# Patient Record
Sex: Male | Born: 1945 | Race: White | Hispanic: No | Marital: Married | Smoking: Former smoker
Health system: Southern US, Community
[De-identification: ages and names within clinical notes are randomized; demographics above are authoritative.]

## PROBLEM LIST (undated history)

## (undated) DIAGNOSIS — Z8719 Personal history of other diseases of the digestive system: Secondary | ICD-10-CM

## (undated) DIAGNOSIS — K219 Gastro-esophageal reflux disease without esophagitis: Secondary | ICD-10-CM

## (undated) DIAGNOSIS — N200 Calculus of kidney: Secondary | ICD-10-CM

## (undated) DIAGNOSIS — K469 Unspecified abdominal hernia without obstruction or gangrene: Secondary | ICD-10-CM

## (undated) DIAGNOSIS — M1990 Unspecified osteoarthritis, unspecified site: Secondary | ICD-10-CM

## (undated) HISTORY — PX: HERNIA REPAIR: SHX51

---

## 1969-12-27 HISTORY — PX: ORCHIECTOMY: SHX2116

## 2001-01-30 ENCOUNTER — Ambulatory Visit (HOSPITAL_BASED_OUTPATIENT_CLINIC_OR_DEPARTMENT_OTHER): Admission: RE | Admit: 2001-01-30 | Discharge: 2001-01-30 | Payer: Self-pay | Admitting: Orthopedic Surgery

## 2004-05-29 ENCOUNTER — Emergency Department (HOSPITAL_COMMUNITY): Admission: EM | Admit: 2004-05-29 | Discharge: 2004-05-29 | Payer: Self-pay | Admitting: Family Medicine

## 2004-06-04 ENCOUNTER — Emergency Department (HOSPITAL_COMMUNITY): Admission: EM | Admit: 2004-06-04 | Discharge: 2004-06-04 | Payer: Self-pay | Admitting: Family Medicine

## 2004-06-16 ENCOUNTER — Emergency Department (HOSPITAL_COMMUNITY): Admission: EM | Admit: 2004-06-16 | Discharge: 2004-06-16 | Payer: Self-pay | Admitting: Family Medicine

## 2005-04-23 ENCOUNTER — Emergency Department (HOSPITAL_COMMUNITY): Admission: EM | Admit: 2005-04-23 | Discharge: 2005-04-23 | Payer: Self-pay | Admitting: Family Medicine

## 2005-06-08 ENCOUNTER — Emergency Department (HOSPITAL_COMMUNITY): Admission: EM | Admit: 2005-06-08 | Discharge: 2005-06-08 | Payer: Self-pay | Admitting: Family Medicine

## 2005-06-16 ENCOUNTER — Ambulatory Visit (HOSPITAL_COMMUNITY): Admission: RE | Admit: 2005-06-16 | Discharge: 2005-06-16 | Payer: Self-pay | Admitting: Orthopedic Surgery

## 2005-07-14 ENCOUNTER — Ambulatory Visit: Payer: Self-pay | Admitting: Infectious Diseases

## 2006-08-16 ENCOUNTER — Ambulatory Visit (HOSPITAL_BASED_OUTPATIENT_CLINIC_OR_DEPARTMENT_OTHER): Admission: RE | Admit: 2006-08-16 | Discharge: 2006-08-16 | Payer: Self-pay | Admitting: Orthopedic Surgery

## 2006-12-27 DIAGNOSIS — N200 Calculus of kidney: Secondary | ICD-10-CM

## 2006-12-27 HISTORY — PX: KNEE ARTHROSCOPY: SUR90

## 2006-12-27 HISTORY — DX: Calculus of kidney: N20.0

## 2008-10-12 ENCOUNTER — Emergency Department (HOSPITAL_COMMUNITY): Admission: EM | Admit: 2008-10-12 | Discharge: 2008-10-12 | Payer: Self-pay | Admitting: Family Medicine

## 2008-12-27 HISTORY — PX: OTHER SURGICAL HISTORY: SHX169

## 2009-03-10 ENCOUNTER — Emergency Department (HOSPITAL_COMMUNITY): Admission: EM | Admit: 2009-03-10 | Discharge: 2009-03-10 | Payer: Self-pay | Admitting: Emergency Medicine

## 2010-09-08 ENCOUNTER — Emergency Department (HOSPITAL_BASED_OUTPATIENT_CLINIC_OR_DEPARTMENT_OTHER): Admission: EM | Admit: 2010-09-08 | Discharge: 2010-09-08 | Payer: Self-pay | Admitting: Emergency Medicine

## 2010-09-08 ENCOUNTER — Ambulatory Visit: Payer: Self-pay | Admitting: Diagnostic Radiology

## 2011-02-04 ENCOUNTER — Other Ambulatory Visit (HOSPITAL_COMMUNITY): Payer: Self-pay | Admitting: Internal Medicine

## 2011-02-04 ENCOUNTER — Encounter (INDEPENDENT_AMBULATORY_CARE_PROVIDER_SITE_OTHER): Payer: Self-pay | Admitting: *Deleted

## 2011-02-04 DIAGNOSIS — R140 Abdominal distension (gaseous): Secondary | ICD-10-CM

## 2011-02-08 ENCOUNTER — Ambulatory Visit (HOSPITAL_COMMUNITY)
Admission: RE | Admit: 2011-02-08 | Discharge: 2011-02-08 | Disposition: A | Discharge status: Home / Self Care | Payer: 59 | Source: Ambulatory Visit | Attending: Internal Medicine | Admitting: Internal Medicine

## 2011-02-08 ENCOUNTER — Encounter (INDEPENDENT_AMBULATORY_CARE_PROVIDER_SITE_OTHER): Payer: Self-pay | Admitting: *Deleted

## 2011-02-08 DIAGNOSIS — R142 Eructation: Secondary | ICD-10-CM | POA: Insufficient documentation

## 2011-02-08 DIAGNOSIS — R140 Abdominal distension (gaseous): Secondary | ICD-10-CM

## 2011-02-08 DIAGNOSIS — K7689 Other specified diseases of liver: Secondary | ICD-10-CM | POA: Insufficient documentation

## 2011-02-08 DIAGNOSIS — R141 Gas pain: Secondary | ICD-10-CM | POA: Insufficient documentation

## 2011-02-11 NOTE — Letter (Signed)
Summary: New Patient letter  [facility]  [address]   [place], [place] [address]   Phone: [phone]  Fax: [phone]       02/04/2011 MRN: [id number]  Eddie Walker [address] [place], [place]  [address]  Dear Eddie Walker,  Welcome to the Gastroenterology Division at [facility].    You are scheduled to see Dr.  Christella Hartigan on 02/26/2011 at 10:00 on the 3rd floor at [facility], [address]  We ask that you try to arrive at our office 15 minutes prior to your appointment time to allow for check-in.  We would like you to complete the enclosed self-administered evaluation form prior to your visit and bring it with you on the day of your appointment.  We will review it with you.  Also, please bring a complete list of all your medications or, if you prefer, bring the medication bottles and we will list them.  Please bring your insurance card so that we may make a copy of it.  If your insurance requires a referral to see a specialist, please bring your referral form from your primary care physician.  Co-payments are due at the time of your visit and may be paid by cash, check or credit card.     Your office visit will consist of a consult with your physician (includes a physical exam), any laboratory testing he/she may order, scheduling of any necessary diagnostic testing (e.g. x-ray, ultrasound, CT-scan), and scheduling of a procedure (e.g. Endoscopy, Colonoscopy) if required.  Please allow enough time on your schedule to allow for any/all of these possibilities.    If you cannot keep your appointment, please call [phone] to cancel or reschedule prior to your appointment date.  This allows [place] the opportunity to schedule an appointment for another patient in need of care.  If you do not cancel or reschedule by 5 p.m. the business day prior to your appointment date, you will be charged a $50.00 late cancellation/no-show fee.    Thank you for  choosing [facility] for your medical needs.  We appreciate the opportunity to care for you.  Please visit [place] at our website  to learn more about our practice.                     Sincerely,                                                             The Gastroenterology Division

## 2011-03-08 ENCOUNTER — Encounter: Payer: Self-pay | Admitting: Gastroenterology

## 2011-03-08 ENCOUNTER — Ambulatory Visit (INDEPENDENT_AMBULATORY_CARE_PROVIDER_SITE_OTHER): Payer: Commercial Managed Care - PPO | Admitting: Gastroenterology

## 2011-03-08 ENCOUNTER — Encounter (INDEPENDENT_AMBULATORY_CARE_PROVIDER_SITE_OTHER): Payer: Self-pay | Admitting: *Deleted

## 2011-03-08 DIAGNOSIS — R12 Heartburn: Secondary | ICD-10-CM

## 2011-03-08 DIAGNOSIS — Z1211 Encounter for screening for malignant neoplasm of colon: Secondary | ICD-10-CM

## 2011-03-16 NOTE — Letter (Signed)
Summary: [facility] Instructions  [facility]  [address] [place], [place] [address]   Phone: [phone]  Fax: [phone]       EDIBERTO Walker    20-May-1946    MRN: [id number]        Procedure Day /Date:03/29/11 MON     Arrival Time:230 pm     Procedure Time:330 pm     Location of Procedure:                    X   Endoscopy Center (4th Floor)                        PREPARATION FOR COLONOSCOPY WITH MOVIPREP   Starting 5 days prior to your procedure 03/24/11  do not eat nuts, seeds, popcorn, corn, beans, peas,  salads, or any raw vegetables.  Do not take any fiber supplements (e.g. Metamucil, Citrucel, and Benefiber).  THE DAY BEFORE YOUR PROCEDURE         DATE: 03/28/11  DAY:SUN  1.  Drink clear liquids the entire day-NO SOLID FOOD  2.  Do not drink anything colored red or purple.  Avoid juices with pulp.  No orange juice.  3.  Drink at least 64 oz. (8 glasses) of fluid/clear liquids during the day to prevent dehydration and help the prep work efficiently.  CLEAR LIQUIDS INCLUDE: Water Jello Ice Popsicles Tea (sugar ok, no milk/cream) Powdered fruit flavored drinks Coffee (sugar ok, no milk/cream) Gatorade Juice: apple, white grape, white cranberry  Lemonade Clear bullion, consomm, broth Carbonated beverages (any kind) Strained chicken noodle soup Hard Candy                             4.  In the morning, mix first dose of MoviPrep solution:    Empty 1 Pouch A and 1 Pouch B into the disposable container    Add lukewarm drinking water to the top line of the container. Mix to dissolve    Refrigerate (mixed solution should be used within 24 hrs)  5.  Begin drinking the prep at 5:00 p.m. The MoviPrep container is divided by 4 marks.   Every 15 minutes drink the solution down to the next mark (approximately 8 oz) until the full liter is complete.   6.  Follow completed prep with 16 oz of clear liquid of your choice (Nothing red or  purple).  Continue to drink clear liquids until bedtime.  7.  Before going to bed, mix second dose of MoviPrep solution:    Empty 1 Pouch A and 1 Pouch B into the disposable container    Add lukewarm drinking water to the top line of the container. Mix to dissolve    Refrigerate  THE DAY OF YOUR PROCEDURE      DATE: 03/29/11 DAY: MON  Beginning at 1030 a.m. (5 hours before procedure):         1. Every 15 minutes, drink the solution down to the next mark (approx 8 oz) until the full liter is complete.  2. Follow completed prep with 16 oz. of clear liquid of your choice.    3. You may drink clear liquids until 130 pm (2 HOURS BEFORE PROCEDURE).   MEDICATION INSTRUCTIONS  Unless otherwise instructed, you should take regular prescription medications with a small sip of water   as early as possible the morning of your procedure.  OTHER INSTRUCTIONS  You will need a responsible adult at least 65 years of age to accompany you and drive you home.   This person must remain in the waiting room during your procedure.  Wear loose fitting clothing that is easily removed.  Leave jewelry and other valuables at home.  However, you may wish to bring a book to read or  an iPod/MP3 player to listen to music as you wait for your procedure to start.  Remove all body piercing jewelry and leave at home.  Total time from sign-in until discharge is approximately 2-3 hours.  You should go home directly after your procedure and rest.  You can resume normal activities the  day after your procedure.  The day of your procedure you should not:   Drive   Make legal decisions   Operate machinery   Drink alcohol   Return to work  You will receive specific instructions about eating, activities and medications before you leave.    The above instructions have been reviewed and explained to me by   _______________________    I fully understand and can verbalize these  instructions _____________________________ Date _________

## 2011-03-16 NOTE — Assessment & Plan Note (Signed)
History of Present Illness Visit Type: Initial Consult Primary GI MD: Rob Bunting MD Primary Provider: Creola Corn, MD Requesting Provider: Creola Corn, MD Chief Complaint: Bloating History of Present Illness:        very pleasant 65 year old man who quit smoking 1.5 years ago.  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  Avoids spicey foods.  Takes several rolaids at night.  Sometimes awakens with pryosis, takes baking soda a night.  He tried prilosec course 14 days.  He felt very well on it.  Felt MUCH better.  Stopped a couple days ago and symptoms begining to return.  Was put on abx for H.pylori serology, done 1-2 weeks ago.  he's never had a colonoscopy, he has no lower GI symptoms.  Overall weight up since he stopped smoking.  Working out recently.   He had an abdominal ultrasound last month and it was normal. CBC and complete metabolic profile were also normal           Current Medications (verified): 1)  Glucosamine-Chondroitin-Msm 500-400-167 Mg Tabs (Glucosamine-Chondroitin-Msm) .Marland Kitchen.. 1 By Mouth Once Daily 2)  Daily Multi  Tabs (Multiple Vitamins-Minerals) .Marland Kitchen.. 1 By Mouth Once Daily 3)  Aspir-Low 81 Mg Tbec (Aspirin) .Marland Kitchen.. 1 By Mouth Once Daily 4)  Avodart 0.5 Mg Caps (Dutasteride) .Marland Kitchen.. 1 By Mouth Q Am  Allergies (verified): 1)  ! Novocain 2)  ! * Peanuts  Past History:  Past Medical History: Arthritis Kidney Stones Benign prostatic hypertrophy  H. pylori positive by blood tests, 2012 treated with antibiotics  Past Surgical History: Knee Arthroscopy 2004 Hernia Surgery R testical removal 1970      Family History:  no colon cancer  Social History:  he is married, he has 3 children, he is a retired Pharmacologist , he does not drink alcohol very often, he does not smoke cigarettes.  Review of Systems       Pertinent positive and negative review of systems were noted in the above HPI and GI specific review of systems.  All other review of  systems was otherwise negative.   Vital Signs:  Patient profile:   65 year old male Height:      73 inches Weight:      204.13 pounds BMI:     27.03 Pulse rate:   68 / minute Pulse rhythm:   regular BP sitting:   116 / 80  (left arm) Cuff size:   regular  Vitals Entered By: June McMurray CMA Duncan Dull) (March 08, 2011 9:48 AM)  Physical Exam  Additional Exam:  Constitutional: generally well appearing Psychiatric: alert and oriented times 3 Eyes: extraocular movements intact Mouth: oropharynx moist, no lesions Neck: supple, no lymphadenopathy Cardiovascular: heart regular rate and rythm Lungs: CTA bilaterally Abdomen: soft, non-tender, non-distended, no obvious ascites, no peritoneal signs, normal bowel sounds Extremities: no lower extremity edema bilaterally Skin: no lesions on visible extremities    Impression & Recommendations:  Problem # 1:  GERD  he has pretty classic GERD symptoms that are well controlled on once daily and he asked that medicine. we will proceed with EGD at his soonest convenience to screen for Barrett's complication, peptic ulcer disease. He did have H. pylori he serologies come back positive one month ago. If he has residual gastritis I will biopsy his stomach to see he has continued infection.  Problem # 2:   routine risk for colon cancer  colonoscopy at the same time as his upper endoscopy.  Patient Instructions:  1)  You will be scheduled to have a colonoscopy. 2)  You will be scheduled to have an upper endoscopy. 3)  GERD handout given. 4)  Restart omeprazole (OTC antiacid medicine) once daily (best taken 20-30 mimutes before a meal. 5)  A copy of this information will be sent to Dr. Timothy Lasso.  6)  The medication list was reviewed and reconciled.  All changed / newly prescribed medications were explained.  A complete medication list was provided to the patient / caregiver.  Appended Document: Orders Update/MOVI    Clinical Lists  Changes  Problems: Added new problem of HEARTBURN (ICD-787.1) Added new problem of SPECIAL SCREENING FOR MALIGNANT NEOPLASMS COLON (ICD-V76.51) Medications: Added new medication of MOVIPREP 100 GM  SOLR (PEG-KCL-NACL-NASULF-NA ASC-C) As per prep instructions. - Signed Rx of MOVIPREP 100 GM  SOLR (PEG-KCL-NACL-NASULF-NA ASC-C) As per prep instructions.;  #1 x 0;  Signed;  Entered by: Chales Abrahams CMA (AAMA);  Authorized by: Rachael Fee MD;  Method used: Electronically to [facility]*, [address], [address] Shipping/mailing, [place], [place]  [address], Ph: [phone], Fax: [phone] Orders: Added new Test order of Colon/Endo (Colon/Endo) - Signed    Prescriptions: MOVIPREP 100 GM  SOLR (PEG-KCL-NACL-NASULF-NA ASC-C) As per prep instructions.  #1 x 0   Entered by:   Chales Abrahams CMA (AAMA)   Authorized by:   Rachael Fee MD   Signed by:   Chales Abrahams CMA (AAMA) on 03/08/2011   Method used:   Electronically to        [facility]* (retail)       [address]       [address] Shipping/mailing       [place], [place]  [address]       Ph: [phone]       Fax: [phone]   RxID:   [id number]

## 2011-03-29 ENCOUNTER — Encounter: Payer: Commercial Managed Care - PPO | Admitting: Gastroenterology

## 2011-05-14 NOTE — Op Note (Signed)
. [facility]  Patient:    Eddie Walker, Eddie Walker                    MRN: [id number]. Date: 01/30/01 Attending:  Elana Alm. Thurston Hole, M.D.                           Operative Report  PREOPERATIVE DIAGNOSIS:  Left knee medial meniscus tear with degenerative joint disease.  POSTOPERATIVE DIAGNOSES: 1. Left knee medial and lateral meniscal tears. 2. 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  PROCEDURES: 1. Left knee EUA followed by arthroscopic partial medial and lateral    meniscectomies. 2. Left knee chondroplasty.  SURGEON:  Elana Alm. Thurston Hole, M.D.  ASSISTANT:  Kirstin A. Shepperson, P.A.  ANESTHESIA:  Local and MAC.  OPERATIVE TIME:  30 minutes.  COMPLICATIONS:  None.  INDICATIONS FOR PROCEDURE:  Mr. Waas is a 65 year old gentleman who has had six to eight weeks of increasing left knee pain with signs and symptoms, and MRI documenting medial meniscus tear, and DJD who has failed conservative care and is now to undergo arthroscopy.  DESCRIPTION OF PROCEDURE:  Mr. Irizarry was brought to the operating room on January 28, 2001 after a block had been placed in the holding room. He was placed on the operative table in the supine position. His left knee was examination under anesthesia. Range of motion was 0 to 125 degrees, 1 to 2+ crepitation, mild varus deformity, knee stable ligamentous exam with normal patella tracking. The left leg was prepped using sterile Betadine and draped using sterile technique. Originally, through an inferolateral portal the arthroscope with a pump attached was placed. Through an inferomedial portal an arthroscopic probe was placed. On initial inspection of the medial compartment he was found to have 75% grade III chondromalacia, which was thoroughly debrided. The medial tibial plateau showed 20% grade IV and 50% grade III changes and this was debrided. He had a complex tear of the  posterior and medial horn of the medial meniscus, of which 50 to 60% was resected back to a stable rim. The intercondylar notch was inspected and the ACL and PCL was normal. There were two loose bodies barely attached to the anterior horn of the lateral meniscus, 1 x 1 cm each, and these were removed. The lateral meniscus was probed. Partial tearing 20% of the inner surface of the posterior and lateral horn, which was resected back to a stable rim. He had 50% grade III chondromalacia lateral femoral condyle, which was debrided. Grade I to II changes of the lateral tibial plateau. The patellofemoral joint showed grade III chondromalacia over 50% of the femoral groove, which was debrided. The patella tracked normally. Grade I and II changes noted on the patella. Moderate synovitis was noted in the medial and lateral gutters, which were debrided. There were well fixed osteophytes on the femoral condyles and these were not removed. After this was done it was felt that all pathology had been satisfactorily addressed. The instruments were removed. The portals were closed with 3-0 nylon suture and injected with 0.25% Marcaine with epinephrine and 4 mg Morphine. Sterile dressing was applied. The patient was awakened and taken to the recovery room in a stable condition.  FOLLOWUP CARE:  Mr. Torian will be followed as an outpatient on Vicodin and Naprosyn. See him back in the office in a week for sutures out and followup. DD:  01/30/01 TD:  01/30/01 Job: [id number]  [id number]

## 2011-05-14 NOTE — Op Note (Signed)
NAMESEMAJ, COBURN           ACCOUNT NO.:  [id number]   MEDICAL RECORD NO.:  [id number]          PATIENT TYPE:  AMB   LOCATION:  DSC                          FACILITY:  [facility]   PHYSICIAN:  Robert A. Thurston Hole, M.D. DATE OF BIRTH:  07/03/1946   DATE OF PROCEDURE:  08/16/2006  DATE OF DISCHARGE:                                 OPERATIVE REPORT   PREOPERATIVE DIAGNOSES:  1. Left knee medial lateral meniscal tears with      chondromalacia/degenerative joint disease and synovitis.  2. Left knee medial meniscal cyst.   POSTOPERATIVE DIAGNOSE:  1. Left knee medial lateral meniscal tears with      chondromalacia/degenerative joint disease and synovitis.  2. Left knee medial meniscal cyst.   PROCEDURE:  1. Left knee examination under anesthesia followed by arthroscopic partial      medial and lateral meniscectomies.  2. Left knee chondroplasty with partial synovectomy.  3. Left knee opened medial meniscal cyst excision.   SURGEON:  Elana Alm. Thurston Hole, M.D.   ASSISTANT:  Julien Girt, P.A.   ANESTHESIA:  General.   OPERATIVE TIME:  40 minutes.   COMPLICATIONS:  None.   INDICATIONS FOR PROCEDURE:  [name] is a 65 year old gentleman has had  significant left knee pain for over 2 years getting progressively worse with  exam and MRI documenting meniscal tearing with DJD and chondromalacia and a  meniscal cyst.  He has failed conservative care is now to undergo  arthroscopy and cyst removal.   DESCRIPTION OF PROCEDURE:  Mr. Kuch is brought to the operating room on  08/16/2006, placed on the operative table in the supine position.  After  being placed under general anesthesia, the left knee was examined.  He had  full range of motion, knee stable on ligamentous exam with a obvious  palpable medial meniscal cyst noted.  The knee was sterilely injected with  0.2 puffs of Marcaine with epinephrine.  He received Ancef 1 grams IV  promptly for prophylaxis.  His left leg  was prepped using sterile DuraPrep  and draped using sterile technique.  Originally through an anterolateral  portal the arthroscope with a pump attached was placed and through an  anteromedial portal an arthroscopic probe was placed.  On initial inspection  of the medial compartment, he was found to 50% grade 4 and the rest grade 3  chondromalacia which was debrided.  Medial meniscus remnant of which 30%  remained showed further tearing in another 50% of this and this was  resected.  Intercondylar notch inspected.  Anterior posterior cruciate  ligaments were normal.  Lateral compartment showed 30% grade 3  chondromalacia which was debrided.  Lateral meniscus tear and posterior horn  30% was resected back to stable rim.  Patellofemoral joint showed 50% grade  3 chondromalacia which was debrided.  The patella tracked normally.  Moderate synovitis in the lateral gutters were debrided.  Otherwise they are  free of pathology.  After this was done the arthroscopic instruments  removed.  A 3 cm medial incision was made off the meniscal cyst.  The  underlying subcutaneous tissues were incised as well  as the skin incision.  The meniscal cyst was identified.  It was removed retaining the underlying  joint capsule.  It was filled with a routine ganglion gelatinous type  material.  It measured two x 3 cm.  Was not sent for pathologic review due  to the obvious benign nature of it.  No other pathology in this area was  noted.  The wound was then irrigated and closed with 2-0 Vicryl and 3-0  Prolene.  Steri-Strips were applied.  Sterile dressings were applied.  The  patient awakened and taken to recovery in stable condition.  Needle and  sponge counts correct x2 at the end of the case.   FOLLOW-UP CARE:  Mr. Buffin will be followed as an outpatient on Vicodin  and aspirin.  Aw to be followed outpatient on Vicodin and Naprosyn.  She  will see me back in my office in one week for sutures out and  followup.      Robert A. Thurston Hole, M.D.  Electronically Signed     RAW/MEDQ  D:  08/16/2006  T:  08/17/2006  Job:  [id number]

## 2011-05-24 ENCOUNTER — Inpatient Hospital Stay (INDEPENDENT_AMBULATORY_CARE_PROVIDER_SITE_OTHER)
Admission: RE | Admit: 2011-05-24 | Discharge: 2011-05-24 | Disposition: A | Discharge status: Home / Self Care | Payer: 59 | Source: Ambulatory Visit | Attending: Family Medicine | Admitting: Family Medicine

## 2011-05-24 DIAGNOSIS — M109 Gout, unspecified: Secondary | ICD-10-CM

## 2012-08-24 DIAGNOSIS — K7689 Other specified diseases of liver: Secondary | ICD-10-CM | POA: Diagnosis not present

## 2012-08-24 DIAGNOSIS — R82998 Other abnormal findings in urine: Secondary | ICD-10-CM | POA: Diagnosis not present

## 2012-08-24 DIAGNOSIS — R7309 Other abnormal glucose: Secondary | ICD-10-CM | POA: Diagnosis not present

## 2012-08-24 DIAGNOSIS — R972 Elevated prostate specific antigen [PSA]: Secondary | ICD-10-CM | POA: Diagnosis not present

## 2012-08-24 DIAGNOSIS — M109 Gout, unspecified: Secondary | ICD-10-CM | POA: Diagnosis not present

## 2012-08-24 DIAGNOSIS — Z79899 Other long term (current) drug therapy: Secondary | ICD-10-CM | POA: Diagnosis not present

## 2012-08-31 DIAGNOSIS — E669 Obesity, unspecified: Secondary | ICD-10-CM | POA: Diagnosis not present

## 2012-08-31 DIAGNOSIS — R972 Elevated prostate specific antigen [PSA]: Secondary | ICD-10-CM | POA: Diagnosis not present

## 2012-08-31 DIAGNOSIS — Z0000 Encounter for general adult medical examination without abnormal findings: Secondary | ICD-10-CM | POA: Diagnosis not present

## 2012-08-31 DIAGNOSIS — R7309 Other abnormal glucose: Secondary | ICD-10-CM | POA: Diagnosis not present

## 2012-08-31 DIAGNOSIS — Z79899 Other long term (current) drug therapy: Secondary | ICD-10-CM | POA: Diagnosis not present

## 2012-09-04 DIAGNOSIS — Z1212 Encounter for screening for malignant neoplasm of rectum: Secondary | ICD-10-CM | POA: Diagnosis not present

## 2012-12-27 HISTORY — PX: EXTRACORPOREAL SHOCK WAVE LITHOTRIPSY: SHX1557

## 2013-01-27 DIAGNOSIS — K469 Unspecified abdominal hernia without obstruction or gangrene: Secondary | ICD-10-CM

## 2013-01-27 HISTORY — DX: Unspecified abdominal hernia without obstruction or gangrene: K46.9

## 2013-03-27 ENCOUNTER — Inpatient Hospital Stay (HOSPITAL_BASED_OUTPATIENT_CLINIC_OR_DEPARTMENT_OTHER)
Admission: EM | Admit: 2013-03-27 | Discharge: 2013-04-01 | DRG: 871 | Disposition: A | Discharge status: Home / Self Care | Payer: Medicare Other | Attending: Internal Medicine | Admitting: Internal Medicine

## 2013-03-27 ENCOUNTER — Emergency Department (HOSPITAL_BASED_OUTPATIENT_CLINIC_OR_DEPARTMENT_OTHER): Payer: Medicare Other

## 2013-03-27 DIAGNOSIS — E876 Hypokalemia: Secondary | ICD-10-CM | POA: Diagnosis not present

## 2013-03-27 DIAGNOSIS — D696 Thrombocytopenia, unspecified: Secondary | ICD-10-CM | POA: Diagnosis present

## 2013-03-27 DIAGNOSIS — A419 Sepsis, unspecified organism: Principal | ICD-10-CM | POA: Diagnosis present

## 2013-03-27 DIAGNOSIS — E860 Dehydration: Secondary | ICD-10-CM

## 2013-03-27 DIAGNOSIS — I9589 Other hypotension: Secondary | ICD-10-CM | POA: Diagnosis not present

## 2013-03-27 DIAGNOSIS — N170 Acute kidney failure with tubular necrosis: Secondary | ICD-10-CM | POA: Diagnosis present

## 2013-03-27 DIAGNOSIS — E872 Acidosis, unspecified: Secondary | ICD-10-CM | POA: Diagnosis present

## 2013-03-27 DIAGNOSIS — N400 Enlarged prostate without lower urinary tract symptoms: Secondary | ICD-10-CM | POA: Diagnosis present

## 2013-03-27 DIAGNOSIS — N200 Calculus of kidney: Secondary | ICD-10-CM | POA: Diagnosis not present

## 2013-03-27 DIAGNOSIS — N19 Unspecified kidney failure: Secondary | ICD-10-CM | POA: Diagnosis not present

## 2013-03-27 DIAGNOSIS — IMO0002 Reserved for concepts with insufficient information to code with codable children: Secondary | ICD-10-CM | POA: Diagnosis present

## 2013-03-27 DIAGNOSIS — N1330 Unspecified hydronephrosis: Secondary | ICD-10-CM | POA: Diagnosis not present

## 2013-03-27 DIAGNOSIS — I959 Hypotension, unspecified: Secondary | ICD-10-CM | POA: Diagnosis not present

## 2013-03-27 DIAGNOSIS — R509 Fever, unspecified: Secondary | ICD-10-CM

## 2013-03-27 DIAGNOSIS — N390 Urinary tract infection, site not specified: Secondary | ICD-10-CM | POA: Diagnosis not present

## 2013-03-27 DIAGNOSIS — R7309 Other abnormal glucose: Secondary | ICD-10-CM | POA: Diagnosis present

## 2013-03-27 DIAGNOSIS — I4892 Unspecified atrial flutter: Secondary | ICD-10-CM | POA: Diagnosis not present

## 2013-03-27 DIAGNOSIS — N179 Acute kidney failure, unspecified: Secondary | ICD-10-CM | POA: Diagnosis not present

## 2013-03-27 HISTORY — DX: Calculus of kidney: N20.0

## 2013-03-27 HISTORY — DX: Unspecified abdominal hernia without obstruction or gangrene: K46.9

## 2013-03-27 LAB — CBC WITH DIFFERENTIAL/PLATELET
Eosinophils Absolute: 0 10*3/uL (ref 0.0–0.7)
Eosinophils Relative: 0 % (ref 0–5)
Hemoglobin: 15.7 g/dL (ref 13.0–17.0)
Lymphocytes Relative: 5 % — ABNORMAL LOW (ref 12–46)
Lymphs Abs: 0.2 10*3/uL — ABNORMAL LOW (ref 0.7–4.0)
MCHC: 34.1 g/dL (ref 30.0–36.0)
Monocytes Absolute: 0.1 10*3/uL (ref 0.1–1.0)
Neutro Abs: 4.3 10*3/uL (ref 1.7–7.7)
RBC: 5.07 MIL/uL (ref 4.22–5.81)
RDW: 14.8 % (ref 11.5–15.5)
WBC: 4.6 10*3/uL (ref 4.0–10.5)

## 2013-03-27 LAB — COMPREHENSIVE METABOLIC PANEL
ALT: 31 U/L (ref 0–53)
Alkaline Phosphatase: 286 U/L — ABNORMAL HIGH (ref 39–117)
BUN: 42 mg/dL — ABNORMAL HIGH (ref 6–23)
CO2: 19 mEq/L (ref 19–32)
Calcium: 10 mg/dL (ref 8.4–10.5)
Chloride: 104 mEq/L (ref 96–112)
Creatinine, Ser: 2.4 mg/dL — ABNORMAL HIGH (ref 0.50–1.35)
GFR calc Af Amer: 31 mL/min — ABNORMAL LOW (ref >90)
Glucose, Bld: 145 mg/dL — ABNORMAL HIGH (ref 70–99)
Potassium: 3.7 mEq/L (ref 3.5–5.1)

## 2013-03-27 LAB — URINE MICROSCOPIC-ADD ON

## 2013-03-27 LAB — URINALYSIS, ROUTINE W REFLEX MICROSCOPIC
Glucose, UA: NEGATIVE mg/dL
Ketones, ur: 15 mg/dL — AB
Specific Gravity, Urine: 1.022 (ref 1.005–1.030)
Urobilinogen, UA: 1 mg/dL (ref 0.0–1.0)
pH: 5.5 (ref 5.0–8.0)

## 2013-03-27 MED ORDER — PIPERACILLIN-TAZOBACTAM 3.375 G IVPB
3.3750 g | Freq: Once | INTRAVENOUS | Status: AC
  Administered 2013-03-27: 3.375 g via INTRAVENOUS
  Filled 2013-03-27: qty 50

## 2013-03-27 MED ORDER — SODIUM CHLORIDE 0.9 % IV BOLUS (SEPSIS)
1000.0000 mL | Freq: Once | INTRAVENOUS | Status: AC
  Administered 2013-03-27: 1000 mL via INTRAVENOUS

## 2013-03-27 MED ORDER — VANCOMYCIN HCL IN DEXTROSE 1-5 GM/200ML-% IV SOLN
1000.0000 mg | Freq: Once | INTRAVENOUS | Status: AC
  Administered 2013-03-27: 1000 mg via INTRAVENOUS
  Filled 2013-03-27: qty 200

## 2013-03-27 NOTE — ED Notes (Signed)
Pt reports unable to void at this time.

## 2013-03-27 NOTE — ED Notes (Signed)
Fever and vomiting x 2 days. Pale and diaphoretic at triage.

## 2013-03-27 NOTE — ED Notes (Signed)
Report given to Maralyn Sago, RN [facility]

## 2013-03-27 NOTE — ED Provider Notes (Signed)
History     CSN: [id number]  Arrival date & time 03/27/13  1931   First MD Initiated Contact with Patient 03/27/13 1956      Chief Complaint  Patient presents with  . Fever    (Consider location/radiation/quality/duration/timing/severity/associated sxs/prior treatment) HPI Comments: The patient presents here with a two day history of fever, nausea, and vomiting.  He denies any diarrhea or abdominal pain.  No urinary complaints.  No cough or chest pain.  Today he couldn't keep anything down and began to having shaking chills.    Patient is a 67 y.o. male presenting with fever. The history is provided by the patient.  Fever Temp source:  Oral Severity:  Moderate Onset quality:  Sudden Duration:  2 days Timing:  Constant Progression:  Worsening Chronicity:  New Relieved by:  Nothing Worsened by:  Nothing tried Ineffective treatments:  None tried Associated symptoms: chills, nausea and vomiting   Associated symptoms: no chest pain, no congestion, no cough and no diarrhea     No past medical history on file.  No past surgical history on file.  No family history on file.  History  Substance Use Topics  . Smoking status: Not on file  . Smokeless tobacco: Not on file  . Alcohol Use: Not on file      Review of Systems  Constitutional: Positive for fever, chills, diaphoresis, appetite change and fatigue.  HENT: Negative for congestion.   Respiratory: Negative for cough.   Cardiovascular: Negative for chest pain.  Gastrointestinal: Positive for nausea and vomiting. Negative for abdominal pain and diarrhea.  All other systems reviewed and are negative.    Allergies  Peanut-containing drug products and Procaine hcl  Home Medications   Current Outpatient Rx  Name  Route  Sig  Dispense  Refill  . aspirin 81 MG tablet   Oral   Take 81 mg by mouth daily.         Marland Kitchen glucosamine-chondroitin 500-400 MG tablet   Oral   Take 1 tablet by mouth 3 (three) times daily.        Marland Kitchen OMEPRAZOLE PO   Oral   Take by mouth.           BP 92/64  Pulse 164  Temp(Src) 100.8 F (38.2 C) (Oral)  Resp 28  Wt 204 lb (92.534 kg)  BMI 26.92 kg/m2  SpO2 87%  Physical Exam  Nursing note and vitals reviewed. Constitutional: He is oriented to person, place, and time.  The patient is pale-appearing and somewhat diaphoretic.  He is otherwise alert, oriented, and appropriate.  HENT:  Head: Normocephalic and atraumatic.  MM's dry.  Neck: Normal range of motion. Neck supple.  Cardiovascular:  The heart is tachycardic but regular.    Pulmonary/Chest: Effort normal and breath sounds normal. No respiratory distress. He has no wheezes.  Abdominal: Soft. Bowel sounds are normal. He exhibits no distension. There is no tenderness.  Musculoskeletal: Normal range of motion. He exhibits no edema.  Lymphadenopathy:    He has no cervical adenopathy.  Neurological: He is alert and oriented to person, place, and time.  Skin: Skin is warm and dry.    ED Course  Procedures (including critical care time)  Labs Reviewed  CULTURE, BLOOD (SINGLE)  CULTURE, BLOOD (ROUTINE X 2)  CULTURE, BLOOD (ROUTINE X 2)  URINE CULTURE  CBC WITH DIFFERENTIAL  COMPREHENSIVE METABOLIC PANEL  LACTIC ACID, PLASMA  TROPONIN I  URINALYSIS, ROUTINE W REFLEX MICROSCOPIC   No results found.  No diagnosis found.   Date: 03/27/2013  Rate: 164  Rhythm: atrial flutter  QRS Axis: normal  Intervals: normal  ST/T Wave abnormalities: nonspecific T wave changes  Conduction Disutrbances:none  Narrative Interpretation:   Old EKG Reviewed: changes noted    MDM  The patient presents here with a two day history of fever, chills, rigors, nausea, and vomiting.  He arrived febrile, tachycardic, and hypotensive.  IV acces was established and he was given 2 LNS.  Septic workup was obtained including cultures of the blood, chest xray, and urinalysis with urine culture.  The labs revealed dehydration  with acute renal failure but cbc was unremarkable.  The creatinine was 2.4, lactate 4.1 and ua was indicative of a uti.  He was treated with broad spectrum antibiotics pending results of the blood cultures.  I have spoken with Dr. Clelia Croft from [facility] who agrees to accept the patient in transfer.    CRITICAL CARE Performed by: Geoffery Lyons   Total critical care time: 45 minutes  Critical care time was exclusive of separately billable procedures and treating other patients.  Critical care was necessary to treat or prevent imminent or life-threatening deterioration.  Critical care was time spent personally by me on the following activities: development of treatment plan with patient and/or surrogate as well as nursing, discussions with consultants, evaluation of patient's response to treatment, examination of patient, obtaining history from patient or surrogate, ordering and performing treatments and interventions, ordering and review of laboratory studies, ordering and review of radiographic studies, pulse oximetry and re-evaluation of patient's condition.   Geoffery Lyons, MD 03/27/13 [phone]

## 2013-03-27 NOTE — ED Notes (Signed)
Patient transported to X-ray via stretcher 

## 2013-03-28 ENCOUNTER — Encounter (HOSPITAL_COMMUNITY): Payer: Self-pay | Admitting: *Deleted

## 2013-03-28 ENCOUNTER — Inpatient Hospital Stay (HOSPITAL_COMMUNITY): Payer: Medicare Other

## 2013-03-28 DIAGNOSIS — N390 Urinary tract infection, site not specified: Secondary | ICD-10-CM | POA: Diagnosis present

## 2013-03-28 DIAGNOSIS — IMO0002 Reserved for concepts with insufficient information to code with codable children: Secondary | ICD-10-CM | POA: Diagnosis present

## 2013-03-28 DIAGNOSIS — N179 Acute kidney failure, unspecified: Secondary | ICD-10-CM | POA: Diagnosis not present

## 2013-03-28 DIAGNOSIS — N400 Enlarged prostate without lower urinary tract symptoms: Secondary | ICD-10-CM | POA: Diagnosis present

## 2013-03-28 DIAGNOSIS — E872 Acidosis: Secondary | ICD-10-CM | POA: Diagnosis present

## 2013-03-28 DIAGNOSIS — R509 Fever, unspecified: Secondary | ICD-10-CM | POA: Diagnosis not present

## 2013-03-28 DIAGNOSIS — I9589 Other hypotension: Secondary | ICD-10-CM | POA: Diagnosis not present

## 2013-03-28 DIAGNOSIS — N19 Unspecified kidney failure: Secondary | ICD-10-CM | POA: Diagnosis not present

## 2013-03-28 LAB — COMPREHENSIVE METABOLIC PANEL
AST: 31 U/L (ref 0–37)
Albumin: 2.5 g/dL — ABNORMAL LOW (ref 3.5–5.2)
BUN: 44 mg/dL — ABNORMAL HIGH (ref 6–23)
Calcium: 7.8 mg/dL — ABNORMAL LOW (ref 8.4–10.5)
Creatinine, Ser: 2.33 mg/dL — ABNORMAL HIGH (ref 0.50–1.35)
GFR calc non Af Amer: 27 mL/min — ABNORMAL LOW (ref >90)

## 2013-03-28 LAB — CBC
MCH: 30 pg (ref 26.0–34.0)
MCHC: 33.3 g/dL (ref 30.0–36.0)
MCV: 89.9 fL (ref 78.0–100.0)
Platelets: 90 10*3/uL — ABNORMAL LOW (ref 150–400)
RDW: 14.9 % (ref 11.5–15.5)

## 2013-03-28 LAB — GLUCOSE, CAPILLARY
Glucose-Capillary: 109 mg/dL — ABNORMAL HIGH (ref 70–99)
Glucose-Capillary: 172 mg/dL — ABNORMAL HIGH (ref 70–99)
Glucose-Capillary: 119 mg/dL — ABNORMAL HIGH (ref 70–99)

## 2013-03-28 LAB — LACTIC ACID, PLASMA
Lactic Acid, Venous: 3.8 mmol/L — ABNORMAL HIGH (ref 0.5–2.2)
Lactic Acid, Venous: 3.5 mmol/L — ABNORMAL HIGH (ref 0.5–2.2)

## 2013-03-28 LAB — MRSA PCR SCREENING: MRSA by PCR: NEGATIVE

## 2013-03-28 MED ORDER — VANCOMYCIN HCL IN DEXTROSE 1-5 GM/200ML-% IV SOLN
1000.0000 mg | INTRAVENOUS | Status: DC
  Administered 2013-03-28 – 2013-03-29 (×2): 1000 mg via INTRAVENOUS
  Filled 2013-03-28 (×3): qty 200

## 2013-03-28 MED ORDER — GLUCOSAMINE-CHONDROITIN 500-400 MG PO TABS: 1.0000 | ORAL_TABLET | Freq: Three times a day (TID) | ORAL | Status: DC

## 2013-03-28 MED ORDER — SODIUM CHLORIDE 0.9 % IV SOLN: INTRAVENOUS | Status: DC

## 2013-03-28 MED ORDER — SODIUM CHLORIDE 0.9 % IV BOLUS (SEPSIS)
1000.0000 mL | Freq: Once | INTRAVENOUS | Status: AC
  Administered 2013-03-28: 1000 mL via INTRAVENOUS

## 2013-03-28 MED ORDER — HYDROMORPHONE HCL PF 1 MG/ML IJ SOLN: 1.0000 mg | INTRAMUSCULAR | Status: AC | PRN

## 2013-03-28 MED ORDER — ASPIRIN 81 MG PO CHEW
81.0000 mg | CHEWABLE_TABLET | Freq: Every day | ORAL | Status: DC
  Administered 2013-03-28 – 2013-04-01 (×5): 81 mg via ORAL
  Filled 2013-03-28 (×5): qty 1

## 2013-03-28 MED ORDER — ACETAMINOPHEN 650 MG RE SUPP: 650.0000 mg | Freq: Four times a day (QID) | RECTAL | Status: DC | PRN

## 2013-03-28 MED ORDER — HYDROCODONE-ACETAMINOPHEN 5-325 MG PO TABS
1.0000 | ORAL_TABLET | ORAL | Status: DC | PRN
  Administered 2013-04-01: 1 via ORAL
  Filled 2013-03-28: qty 1

## 2013-03-28 MED ORDER — PANTOPRAZOLE SODIUM 40 MG PO TBEC: 40.0000 mg | DELAYED_RELEASE_TABLET | Freq: Every day | ORAL | Status: DC

## 2013-03-28 MED ORDER — ONDANSETRON HCL 4 MG/2ML IJ SOLN: 4.0000 mg | Freq: Three times a day (TID) | INTRAMUSCULAR | Status: AC | PRN

## 2013-03-28 MED ORDER — PANTOPRAZOLE SODIUM 40 MG PO TBEC
40.0000 mg | DELAYED_RELEASE_TABLET | Freq: Every day | ORAL | Status: DC
  Administered 2013-03-28 – 2013-04-01 (×5): 40 mg via ORAL
  Filled 2013-03-28 (×5): qty 1

## 2013-03-28 MED ORDER — PIPERACILLIN-TAZOBACTAM 3.375 G IVPB
3.3750 g | Freq: Three times a day (TID) | INTRAVENOUS | Status: AC
  Administered 2013-03-28 – 2013-03-31 (×12): 3.375 g via INTRAVENOUS
  Filled 2013-03-28 (×14): qty 50

## 2013-03-28 MED ORDER — ACETAMINOPHEN 325 MG PO TABS
650.0000 mg | ORAL_TABLET | Freq: Four times a day (QID) | ORAL | Status: DC | PRN
  Administered 2013-03-28 – 2013-04-01 (×10): 650 mg via ORAL
  Filled 2013-03-28 (×11): qty 2

## 2013-03-28 MED ORDER — ENOXAPARIN SODIUM 40 MG/0.4ML ~~LOC~~ SOLN
40.0000 mg | SUBCUTANEOUS | Status: DC
  Administered 2013-03-28 – 2013-03-29 (×2): 40 mg via SUBCUTANEOUS
  Filled 2013-03-28 (×3): qty 0.4

## 2013-03-28 MED ORDER — SODIUM CHLORIDE 0.9 % IJ SOLN
3.0000 mL | Freq: Two times a day (BID) | INTRAMUSCULAR | Status: DC
  Administered 2013-03-28 – 2013-03-31 (×7): 3 mL via INTRAVENOUS

## 2013-03-28 MED ORDER — SODIUM CHLORIDE 0.9 % IV SOLN
INTRAVENOUS | Status: DC
  Administered 2013-03-28: 1000 mL via INTRAVENOUS
  Administered 2013-03-28 (×4): via INTRAVENOUS
  Administered 2013-03-28: 300 mL via INTRAVENOUS
  Administered 2013-03-29: 1000 mL via INTRAVENOUS

## 2013-03-28 NOTE — Progress Notes (Signed)
Utilization Review Completed. 03/28/2013  

## 2013-03-28 NOTE — Progress Notes (Signed)
ANTIBIOTIC CONSULT NOTE - INITIAL  Pharmacy Consult for vancomycin, Zosyn Indication: rule out sepsis  Allergies  Allergen Reactions  . Peanut-Containing Drug Products     REACTION: Patient has Epipen to treat this- anaphylaxis shock  . Procaine Hcl     Patient Measurements: Height: 6' 0.83" (185 cm) Weight: 204 lb (92.534 kg) IBW/kg (Calculated) : 79.52  Vital Signs: Temp: 100.9 F (38.3 C) (04/01 2351) Temp src: Oral (04/01 2351) BP: 85/54 mmHg (04/02 0000) Pulse Rate: 134 (04/02 0000) Intake/Output from previous day: 04/01 0701 - 04/02 0700 In: 3 [I.V.:3] Out: -  Intake/Output from this shift: Total I/O In: 3 [I.V.:3] Out: -   Labs:  Recent Labs  03/27/13 1951  WBC 4.6  HGB 15.7  PLT 116*  CREATININE 2.40*   Estimated Creatinine Clearance: 34 ml/min (by C-G formula based on Cr of 2.4). No results found for this basename: VANCOTROUGH, VANCOPEAK, VANCORANDOM, GENTTROUGH, GENTPEAK, GENTRANDOM, TOBRATROUGH, TOBRAPEAK, TOBRARND, AMIKACINPEAK, AMIKACINTROU, AMIKACIN,  in the last 72 hours   Microbiology: No results found for this or any previous visit (from the past 720 hour(s)).  Medical History: Past Medical History  Diagnosis Date  . Hernia 01/2013  . Kidney stone 2008    Medications:  Scheduled:  . aspirin  81 mg Oral Daily  . enoxaparin (LOVENOX) injection  40 mg Subcutaneous Q24H  . pantoprazole  40 mg Oral Q0600  . piperacillin-tazobactam (ZOSYN)  IV  3.375 g Intravenous Once  . [COMPLETED] sodium chloride  1,000 mL Intravenous Once  . [COMPLETED] sodium chloride  1,000 mL Intravenous Once  . [COMPLETED] sodium chloride  1,000 mL Intravenous Once  . [COMPLETED] sodium chloride  1,000 mL Intravenous Once  . sodium chloride  3 mL Intravenous Q12H  . [COMPLETED] vancomycin  1,000 mg Intravenous Once  . [DISCONTINUED] sodium chloride   Intravenous STAT  . [DISCONTINUED] glucosamine-chondroitin  1 tablet Oral TID   Assessment: 67 yo male presented  with r/o sepsis. Pharmacy to manage vancomycin and Zosyn. Patient has already received vancomycin 1gm x 1 and Zosyn 3.375gm x 1.  Goal of Therapy:  Vancomycin trough 10 - 20 mcg/mL  Plan:  1. Zosyn 3.375gm IV Q8H (4 hr infusion) 2. Vancomycin 1gm IV Q24H.   Emeline Gins 03/28/2013,1:20 AM

## 2013-03-28 NOTE — H&P (Signed)
PCP:   Creola Corn, MD   Chief Complaint:  Fever, chills  HPI: [name] is a remarkably healthy 67 year old white male with a history of BPH who presented to the [facility] emergency department for evaluation of fever and chills. Patient states he was in his usual state of health until 2 days ago when he developed nausea and vomiting associated with fever and chills. His initial fever was up to 100.1.  He had multiple episodes of nausea and vomiting over the ensuing 24 hours. This morning, he felt like he was improving and was able to keep down some liquids today. He actually left the house for a brief period of time. However, this evening he developed a recurrent fever and shaking chills.  His wife, who works in the vascular lab, attempted to check his vital signs but had difficulty obtaining a blood pressure. She made him go to the emergency department where he had a temperature of 100.8, blood pressure 92/64, and heart rate of 164.  Given these hemodynamic abnormalities, he was transferred to [facility] step down bed for further management of an acute febrile illness.  He was given a dose of empiric vancomycin and Zosyn prior to transfer after I spoke with the ED physician.  The patient denies any specific symptoms or source of fever other than N/V. He denies diarrhea. Mild dysuria but no urinary frequency or flank pain. He has not had a history of recurrent UTIs but does have BPH and has been off of his Avodart for 9 months. No recent travel. No recent dental work or surgeries. No sick contacts. No neck pain or stiffness. No rash.  Review of Systems:  Review of Systems - all systems reviewed with patient and are negative except in history of present illness with following exceptions: Chronic left knee pain without any acute change.  Past Medical History:  B Knee OA BPH - Dr. Edwyna Shell (Urologist) @ [facility] in [place] -  2 Bx's (-) Nephrolitiasis Dyspepsia. S/P Rx  for H Pylori Gout mild Fatty liver on [place].  Past Surgical History: Left Knee Surgery (8/07) LKnee Arthroscopy 2002 2005 R Knee Arthroscopy R wrist Fx 1957 1972 Road rash MCA L Sided inguinal hernia repair 1953 R Testicle removal 1971 due to trauma 2010 LBP due to getting hit with Golf ball  Medications: Prior to Admission medications   Medication Sig Start Date End Date Taking? Authorizing Provider  aspirin 81 MG tablet Take 81 mg by mouth daily.   Yes Historical Provider, MD  glucosamine-chondroitin 500-400 MG tablet Take 1 tablet by mouth 3 (three) times daily.   Yes Historical Provider, MD  OMEPRAZOLE PO Take by mouth.   Yes Historical Provider, MD    Allergies:   Allergies  Allergen Reactions  . Peanut-Containing Drug Products     REACTION: Patient has Epipen to treat this- anaphylaxis shock  . Procaine Hcl     Social History: Married, Adopted childeren - no natural Retired Enjoys Systems analyst. Quit Smoking 11/2009 after 40 pk yr history. occ Cigars. Social ETOH.   Family History: 42 Parkinsons deceased 85 Mother alive  96 Brother [place], Vision issues  Physical Exam: Filed Vitals:   03/27/13 2004 03/27/13 2134 03/27/13 2226 03/27/13 2351  BP: 104/66 101/65 91/65 98/67   Pulse: 145 133 129 133  Temp: 99.1 F (37.3 C) 99.7 F (37.6 C)  100.9 F (38.3 C)  TempSrc: Oral Oral  Oral  Resp: 27 20 23 23   Weight:  SpO2: 96% 96% 97% 93%   General appearance: alert and no distress; looks remarkably healthy Head: Normocephalic, without obvious abnormality, atraumatic Eyes: conjunctivae/corneas clear. PERRL, EOM's intact. Mild right ptosis  Nose: Nares normal. Septum midline. Mucosa normal. No drainage or sinus tenderness. Throat: lips, mucosa, and tongue normal; teeth and gums normal Neck: no adenopathy, no carotid bruit, no JVD and thyroid not enlarged, symmetric, no tenderness/mass/nodules; no nuchal rigidity Resp: clear to auscultation bilaterally Cardio: Distant  heart sounds; no murmurs rubs or gallops; tachycardic GI: soft, non-tender; bowel sounds normal; no masses,  no organomegaly Extremities: extremities normal, atraumatic, no cyanosis or edema Pulses: 2+ and symmetric Lymph nodes:  no cervical lymphadenopathy Neurologic: Alert and oriented X 3, normal strength and tone.     Labs on Admission:   Recent Labs  03/27/13 1951  NA 142  K 3.7  CL 104  CO2 19  GLUCOSE 145*  BUN 42*  CREATININE 2.40*  CALCIUM 10.0    Recent Labs  03/27/13 1951  AST 36  ALT 31  ALKPHOS 286*  BILITOT 1.5*  PROT 8.1  ALBUMIN 3.6     Recent Labs  03/27/13 1951  WBC 4.6  NEUTROABS 4.3  HGB 15.7  HCT 46.1  MCV 90.9  PLT 116*    Recent Labs  03/27/13 1953  TROPONINI <0.30   Lactic Acid 4.1 Urinalysis 21-50 WBCs, 11-20 RBCs Blood/Urine Cultures pending  Radiological Exams on Admission: Dg Chest 2 View  03/27/2013  *RADIOLOGY REPORT*  Clinical Data: Fever, tachycardia  CHEST - 2 VIEW  Comparison: None.  Findings: Normal cardiac silhouette and mediastinal contours.  No focal parenchymal opacity.  No pleural effusion or pneumothorax. No acute osseous abnormalities.  Post cholecystectomy.  IMPRESSION: No acute cardiopulmonary disease.   Original Report Authenticated By: Tacey Ruiz, MD    Orders placed during the hospital encounter of 03/27/13  . ED EKG- Sinus Tachycardia, NSST changes    Assessment/Plan Principal Problem: 1. Sepsis syndrome- acute febrile illness associated with hypotension, tachycardia, acute renal failure, and lactic acidosis is most consistent with sepsis syndrome. Most likely source is urinary tract infection given his pyuria and lack of other focal symptoms. He has a significant left shift but no leukocytosis. We'll treat empirically with broad-spectrum antibiotics including vancomycin and Zosyn pending urine and blood cultures. Doubt influenza in the absence of respiratory symptoms.  Continue aggressive volume  resuscitation with normal saline. If no improvement, may need to consider pressor therapy.  Active Problems: 2. Acute renal failure- monitor renal function with hydration. Likely prerenal azotemia +/- ATN secondary to sepsis.   3. UTI (urinary tract infection)- treat empirically with Zosyn pending culture data. 4. Lactic acidosis- he has a benign abdominal exam- suspect this is secondary to sepsis. Repeat lactic acid in the morning. 5. BPH (benign prostatic hyperplasia)- consider resuming Avodart. This may increase his risk of UTI/prostatitis as the source of infection. 6. DVT prophylaxis- Lovenox 7. Disposition- anticipate discharge to home in 2-3 days once stable.  Eddie Walker,W Eddie Walker 03/28/2013, 12:02 AM

## 2013-03-28 NOTE — Progress Notes (Signed)
Received a blood culture result, anaerobic bottle is gram (-) rods.  Result given to Amy of Dr. Felipa Eth (on-call for Dr. Timothy Lasso).

## 2013-03-28 NOTE — Progress Notes (Signed)
Subjective: Admitted with Fever,Chills, N/V, Rigors, Lactic Acidosis, ARF, Hypotension, now with Sig Leukocytosis/left Shift c/w Sepsis - presumed from Urinary source. Tachycardia is some better.  Feels some better post Abx and fluids this am. Some SOB. Walking about room. Mildly diaphoretic Says he feels better    Objective: Vital signs in last 24 hours: Temp:  [97.9 F (36.6 C)-100.9 F (38.3 C)] 97.9 F (36.6 C) (04/02 0822) Pulse Rate:  [101-164] 101 (04/02 0822) Resp:  [16-28] 16 (04/02 0822) BP: (74-116)/(54-77) 100/72 mmHg (04/02 0822) SpO2:  [87 %-100 %] 95 % (04/02 0822) Weight:  [91.7 kg (202 lb 2.6 oz)-92.534 kg (204 lb)] 91.7 kg (202 lb 2.6 oz) (04/02 0400) Weight change:  Last BM Date: 03/27/13  CBG (last 3)   Recent Labs  03/28/13 0817  GLUCAP 109*    Intake/Output from previous day:  Intake/Output Summary (Last 24 hours) at 03/28/13 0838 Last data filed at 03/28/13 0600  Gross per 24 hour  Intake   1163 ml  Output    300 ml  Net    863 ml   04/01 0701 - 04/02 0700 In: 1163 [P.O.:120; I.V.:993; IV Piggyback:50] Out: 300 [Urine:300]   Physical Exam General appearance: Looks better than he ought to Eyes: no scleral icterus Throat: oropharynx moist without erythema Resp: mild R base rale o/w clear Cardio: Reg GI: soft, non-tender; bowel sounds normal; no masses,  no organomegaly Extremities: no clubbing, cyanosis or edema Tattoos noted.   Lab Results:  Recent Labs  03/27/13 1951 03/28/13 0614  NA 142 143  K 3.7 3.8  CL 104 110  CO2 19 16*  GLUCOSE 145* 116*  BUN 42* 44*  CREATININE 2.40* 2.33*  CALCIUM 10.0 7.8*     Recent Labs  03/27/13 1951 03/28/13 0614  AST 36 31  ALT 31 23  ALKPHOS 286* 65  BILITOT 1.5* 1.5*  PROT 8.1 6.0  ALBUMIN 3.6 2.5*     Recent Labs  03/27/13 1951 03/28/13 0614  WBC 4.6 17.6*  NEUTROABS 4.3  --   HGB 15.7 12.2*  HCT 46.1 36.6*  MCV 90.9 89.9  PLT 116* 90*    No results found for  this basename: INR, PROTIME     Recent Labs  03/27/13 1953  TROPONINI <0.30    No results found for this basename: TSH, T4TOTAL, FREET3, T3FREE, THYROIDAB,  in the last 72 hours  No results found for this basename: VITAMINB12, FOLATE, FERRITIN, TIBC, IRON, RETICCTPCT,  in the last 72 hours  Micro Results: Recent Results (from the past 240 hour(s))  MRSA PCR SCREENING     Status: None   Collection Time    03/27/13 11:51 PM      Result Value Range Status   MRSA by PCR NEGATIVE  NEGATIVE Final   Comment:            The GeneXpert MRSA Assay (FDA     approved for NASAL specimens     only), is one component of a     comprehensive MRSA colonization     surveillance program. It is not     intended to diagnose MRSA     infection nor to guide or     monitor treatment for     MRSA infections.     Studies/Results: Dg Chest 2 View  03/27/2013  *RADIOLOGY REPORT*  Clinical Data: Fever, tachycardia  CHEST - 2 VIEW  Comparison: None.  Findings: Normal cardiac silhouette and mediastinal contours.  No focal  parenchymal opacity.  No pleural effusion or pneumothorax. No acute osseous abnormalities.  Post cholecystectomy.  IMPRESSION: No acute cardiopulmonary disease.   Original Report Authenticated By: Tacey Ruiz, MD      Medications: Scheduled: . aspirin  81 mg Oral Daily  . enoxaparin (LOVENOX) injection  40 mg Subcutaneous Q24H  . pantoprazole  40 mg Oral Daily  . piperacillin-tazobactam (ZOSYN)  IV  3.375 g Intravenous Q8H  . sodium chloride  3 mL Intravenous Q12H  . vancomycin  1,000 mg Intravenous Q24H   Continuous: . sodium chloride 200 mL/hr at 03/28/13 8119     Assessment/Plan: Principal Problem:   Sepsis syndrome Active Problems:   Acute renal failure   UTI (urinary tract infection)   Lactic acidosis   BPH (benign prostatic hyperplasia)  1. Sepsis syndrome- acute febrile illness associated with hypotension, tachycardia, acute renal failure, and lactic acidosis.  Most likely source is urinary tract infection given pyuria and lack of other focal symptoms. Re-check CXR today.  Lactate still high.  He now has a leukocytosis. Continue broad-spectrum antibiotics including vancomycin and Zosyn pending urine and blood cultures. Doubt influenza in the absence of respiratory symptoms. Continue aggressive volume resuscitation with normal saline. OK to continue to hold pressor therapy.    2. Acute renal failure- monitor renal function with hydration. Likely prerenal azotemia +/- ATN secondary to sepsis.  3. UTI (urinary tract infection)- treat empirically with Zosyn pending culture data.  4. Lactic acidosis- he has a benign abdominal exam- suspect this is secondary to sepsis. Repeat lactic acid again tom am = 3.5 today.  5. BPH (benign prostatic hyperplasia)- consider resuming Avodart. This may increase his risk of UTI/prostatitis as the source of infection.  6. DVT prophylaxis- Lovenox  7. Disposition- anticipate discharge to home in 2-3 days once stable. 8. SOB and rale - watch IVF and re-check CXR 9. N/V - Better. 10. Continue clears - he does not have an appetite for more  ID -  Anti-infectives   Start     Dose/Rate Route Frequency Ordered Stop   03/28/13 1000  vancomycin (VANCOCIN) IVPB 1000 mg/200 mL premix     1,000 mg 200 mL/hr over 60 Minutes Intravenous Every 24 hours 03/28/13 0126     03/28/13 0200  piperacillin-tazobactam (ZOSYN) IVPB 3.375 g     3.375 g 12.5 mL/hr over 240 Minutes Intravenous Every 8 hours 03/28/13 0126     03/27/13 2215  piperacillin-tazobactam (ZOSYN) IVPB 3.375 g     3.375 g 12.5 mL/hr over 240 Minutes Intravenous  Once 03/27/13 2205 03/28/13 0224   03/27/13 2215  vancomycin (VANCOCIN) IVPB 1000 mg/200 mL premix     1,000 mg 200 mL/hr over 60 Minutes Intravenous  Once 03/27/13 2206 03/27/13 2324        LOS: 1 day   Jacquise Rarick M 03/28/2013, 8:38 AM

## 2013-03-28 NOTE — Progress Notes (Signed)
CRITICAL VALUE ALERT  Critical value received:  Gram (-) rods in anaerobic bottle  Date of notification:  03/28/2013   Time of notification:  1052  Critical value read back:yes  Nurse who received alert:  Dorthula Nettles  MD notified (1st page):  Dr. Felipa Eth  Time of first page:  1057  MD notified (2nd page):  Time of second page:  Responding MD:  Linton Rump, CMA  Time MD responded: 1059

## 2013-03-28 NOTE — Progress Notes (Signed)
PHARMACIST - PHYSICIAN ORDER COMMUNICATION  CONCERNING: P&T Medication Policy on Herbal Medications  DESCRIPTION:  This patient's order for:  Glucosamine and chondroiton  has been noted.  This product(s) is classified as an "herbal" or natural product. Due to a lack of definitive safety studies or FDA approval, nonstandard manufacturing practices, plus the potential risk of unknown drug-drug interactions while on inpatient medications, the Pharmacy and Therapeutics Committee does not permit the use of "herbal" or natural products of this type within [facility].   ACTION TAKEN: The pharmacy department is unable to verify this order at this time and your patient has been informed of this safety policy. Please reevaluate patient's clinical condition at discharge and address if the herbal or natural product(s) should be resumed at that time.

## 2013-03-29 DIAGNOSIS — R509 Fever, unspecified: Secondary | ICD-10-CM | POA: Diagnosis not present

## 2013-03-29 DIAGNOSIS — N390 Urinary tract infection, site not specified: Secondary | ICD-10-CM | POA: Diagnosis not present

## 2013-03-29 DIAGNOSIS — N19 Unspecified kidney failure: Secondary | ICD-10-CM | POA: Diagnosis not present

## 2013-03-29 DIAGNOSIS — N179 Acute kidney failure, unspecified: Secondary | ICD-10-CM | POA: Diagnosis not present

## 2013-03-29 DIAGNOSIS — I9589 Other hypotension: Secondary | ICD-10-CM | POA: Diagnosis not present

## 2013-03-29 LAB — CBC
Hemoglobin: 11.9 g/dL — ABNORMAL LOW (ref 13.0–17.0)
MCH: 30.8 pg (ref 26.0–34.0)
MCHC: 34.4 g/dL (ref 30.0–36.0)
Platelets: 71 10*3/uL — ABNORMAL LOW (ref 150–400)
RBC: 3.86 MIL/uL — ABNORMAL LOW (ref 4.22–5.81)

## 2013-03-29 LAB — LACTIC ACID, PLASMA: Lactic Acid, Venous: 1.9 mmol/L (ref 0.5–2.2)

## 2013-03-29 LAB — BASIC METABOLIC PANEL
Calcium: 8.1 mg/dL — ABNORMAL LOW (ref 8.4–10.5)
GFR calc non Af Amer: 42 mL/min — ABNORMAL LOW (ref >90)
Glucose, Bld: 87 mg/dL (ref 70–99)
Potassium: 3.5 mEq/L (ref 3.5–5.1)
Sodium: 142 mEq/L (ref 135–145)

## 2013-03-29 MED ORDER — SODIUM CHLORIDE 0.9 % IV SOLN
INTRAVENOUS | Status: DC
  Administered 2013-03-29 – 2013-03-31 (×2): via INTRAVENOUS
  Administered 2013-03-31: 30 mL/h via INTRAVENOUS

## 2013-03-29 MED ORDER — DUTASTERIDE 0.5 MG PO CAPS
0.5000 mg | ORAL_CAPSULE | Freq: Every day | ORAL | Status: DC
  Administered 2013-03-29 – 2013-04-01 (×4): 0.5 mg via ORAL
  Filled 2013-03-29 (×5): qty 1

## 2013-03-29 NOTE — Progress Notes (Signed)
Has begun having shakes and chills- says that this is what happened before he was admitted. Contacted MD.

## 2013-03-29 NOTE — Progress Notes (Addendum)
Subjective: Admitted with Fever,Chills, N/V, Rigors, Lactic Acidosis, ARF, Hypotension, now with Sig Leukocytosis/left Shift c/w Sepsis - presumed from Urinary source - Now growing out Gram Neg Rods. Tachycardia is better.  BP is better.  No more Fevers. Feels better. Less SOB. More Hungry   Objective: Vital signs in last 24 hours: Temp:  [97.9 F (36.6 C)-98.1 F (36.7 C)] 98 F (36.7 C) (04/03 0415) Pulse Rate:  [82-101] 82 (04/03 0415) Resp:  [16-21] 17 (04/03 0415) BP: (98-114)/(71-85) 104/71 mmHg (04/03 0415) SpO2:  [94 %-97 %] 95 % (04/03 0415) Weight:  [94.6 kg (208 lb 8.9 oz)] 94.6 kg (208 lb 8.9 oz) (04/02 2331) Weight change: 2.066 kg (4 lb 8.9 oz) Last BM Date: 03/27/13  CBG (last 3)   Recent Labs  03/28/13 0817 03/28/13 1119 03/28/13 1807  GLUCAP 109* 172* 119*    Intake/Output from previous day:  Intake/Output Summary (Last 24 hours) at 03/29/13 0642 Last data filed at 03/29/13 0600  Gross per 24 hour  Intake 3287.17 ml  Output    300 ml  Net 2987.17 ml   04/02 0701 - 04/03 0700 In: 3087.2 [P.O.:400; I.V.:2637.2; IV Piggyback:50] Out: 300 [Urine:300]   Physical Exam General appearance: Looks better. Eyes: no scleral icterus Throat: oropharynx moist without erythema Resp: CTA B Cardio: Reg GI: soft, non-tender; bowel sounds normal; no masses,  no organomegaly Extremities: no clubbing, cyanosis or edema Tattoos noted.   Lab Results:  Recent Labs  03/28/13 0614 03/29/13 0518  NA 143 142  K 3.8 3.5  CL 110 114*  CO2 16* 18*  GLUCOSE 116* 87  BUN 44* 45*  CREATININE 2.33* 1.65*  CALCIUM 7.8* 8.1*     Recent Labs  03/27/13 1951 03/28/13 0614  AST 36 31  ALT 31 23  ALKPHOS 286* 65  BILITOT 1.5* 1.5*  PROT 8.1 6.0  ALBUMIN 3.6 2.5*     Recent Labs  03/27/13 1951 03/28/13 0614 03/29/13 0518  WBC 4.6 17.6* 21.3*  NEUTROABS 4.3  --   --   HGB 15.7 12.2* 11.9*  HCT 46.1 36.6* 34.6*  MCV 90.9 89.9 89.6  PLT 116* 90* 71*     No results found for this basename: INR,  PROTIME     Recent Labs  03/27/13 1953  TROPONINI <0.30    No results found for this basename: TSH, T4TOTAL, FREET3, T3FREE, THYROIDAB,  in the last 72 hours  No results found for this basename: VITAMINB12, FOLATE, FERRITIN, TIBC, IRON, RETICCTPCT,  in the last 72 hours  Micro Results: Recent Results (from the past 240 hour(s))  CULTURE, BLOOD (SINGLE)     Status: None   Collection Time    03/27/13  7:53 PM      Result Value Range Status   Specimen Description BLOOD RIGHT FOREARM   Final   Special Requests NONE BOTTLES DRAWN AEROBIC AND ANAEROBIC EACH   Final   Culture  Setup Time 03/28/2013 01:56   Final   Culture     Final   Value: GRAM NEGATIVE RODS     Note: Gram Stain Report Called to,Read Back By and Verified With: TATA CARBONE@1052  ON [id number] BY [facility]   Report Status PENDING   Incomplete  CULTURE, BLOOD (ROUTINE X 2)     Status: None   Collection Time    03/27/13  8:42 PM      Result Value Range Status   Specimen Description BLOOD LEFT ANTECUBITAL   Final   Special Requests  Final   Value: Normal BOTTLES DRAWN AEROBIC AND ANAEROBIC 5 ML EACH   Culture  Setup Time 03/28/2013 01:55   Final   Culture     Final   Value: GRAM NEGATIVE RODS     Note: Gram Stain Report Called to,Read Back By and Verified With: Aggie Moats  ON [id number] BY [facility]   Report Status PENDING   Incomplete  MRSA PCR SCREENING     Status: None   Collection Time    03/27/13 11:51 PM      Result Value Range Status   MRSA by PCR NEGATIVE  NEGATIVE Final   Comment:            The GeneXpert MRSA Assay (FDA     approved for NASAL specimens     only), is one component of a     comprehensive MRSA colonization     surveillance program. It is not     intended to diagnose MRSA     infection nor to guide or     monitor treatment for     MRSA infections.     Studies/Results: Dg Chest 2 View  03/28/2013  *RADIOLOGY REPORT*  Clinical Data:  Rales, fever, headache  CHEST - 2 VIEW  Comparison: 03/27/2013  Findings: Normal heart size and pulmonary vascularity. Tortuous thoracic aorta. Bibasilar atelectasis and small right pleural effusion. Lungs otherwise clear. No pneumothorax. No acute osseous findings.  IMPRESSION: Bibasilar atelectasis greater on the right with small right pleural effusion.   Original Report Authenticated By: Ulyses Southward, M.D.    Dg Chest 2 View  03/27/2013  *RADIOLOGY REPORT*  Clinical Data: Fever, tachycardia  CHEST - 2 VIEW  Comparison: None.  Findings: Normal cardiac silhouette and mediastinal contours.  No focal parenchymal opacity.  No pleural effusion or pneumothorax. No acute osseous abnormalities.  Post cholecystectomy.  IMPRESSION: No acute cardiopulmonary disease.   Original Report Authenticated By: Tacey Ruiz, MD      Medications: Scheduled: . aspirin  81 mg Oral Daily  . enoxaparin (LOVENOX) injection  40 mg Subcutaneous Q24H  . pantoprazole  40 mg Oral Daily  . piperacillin-tazobactam (ZOSYN)  IV  3.375 g Intravenous Q8H  . sodium chloride  3 mL Intravenous Q12H  . vancomycin  1,000 mg Intravenous Q24H   Continuous: . sodium chloride 75 mL/hr at 03/28/13 2119     Assessment/Plan: Principal Problem:   Sepsis syndrome Active Problems:   Acute renal failure   UTI (urinary tract infection)   Lactic acidosis   BPH (benign prostatic hyperplasia)  1. Sepsis syndrome with Gram Neg Rod bacteremia most likely source is urinary tract infection given pyuria and lack of other focal symptoms.- hypotension improving, tachycardia improving, acute renal failure improving, and lactic acidosis improved.  Re-peat CXR yesterday was non-focal.  Leukocytosis worse but he is better. Continue broad-spectrum antibiotics including vancomycin and Zosyn pending urine and blood cultures/Sensitivities - may not need the Vanco. Weaning aggressive volume resuscitation with normal saline.    2. Acute renal failure- monitor  renal function with hydration. Likely prerenal azotemia +/- ATN secondary to sepsis. Improving. 1.65 3. UTI (urinary tract infection)- treat empirically with Zosyn pending culture data.  4. Lactic acidosis- Better at 1.9 5. BPH (benign prostatic hyperplasia)- consider resuming Avodart. This may increase his risk of UTI/prostatitis as the source of infection.  6. DVT prophylaxis- Lovenox  7. Disposition- anticipate discharge to home by Sat 8. N/V/Fever - Better. 9. Advance diet. 10 transfer to floor/Ambulate/Await Culture  Data and work toward a Sat D/C 11. Thrombocytopenia - monitor. 12. Hyperglycemia. Monitor.   ID -  Anti-infectives   Start     Dose/Rate Route Frequency Ordered Stop   03/28/13 1000  vancomycin (VANCOCIN) IVPB 1000 mg/200 mL premix     1,000 mg 200 mL/hr over 60 Minutes Intravenous Every 24 hours 03/28/13 0126     03/28/13 0200  piperacillin-tazobactam (ZOSYN) IVPB 3.375 g     3.375 g 12.5 mL/hr over 240 Minutes Intravenous Every 8 hours 03/28/13 0126     03/27/13 2215  piperacillin-tazobactam (ZOSYN) IVPB 3.375 g     3.375 g 12.5 mL/hr over 240 Minutes Intravenous  Once 03/27/13 2205 03/28/13 0224   03/27/13 2215  vancomycin (VANCOCIN) IVPB 1000 mg/200 mL premix     1,000 mg 200 mL/hr over 60 Minutes Intravenous  Once 03/27/13 2206 03/27/13 2324        LOS: 2 days   Eddie Walker M 03/29/2013, 6:42 AM

## 2013-03-29 NOTE — Progress Notes (Signed)
NURSING PROGRESS NOTE  Jaece Ducharme [id number] Transfer Data: 03/29/2013 2:54 PM Attending Provider: Gwen Pounds, MD PCP:No primary provider on file. Code Status: full   Ladarien Beeks is a 67 y.o. male patient transferred from 51  -No acute distress noted.  -No complaints of shortness of breath.  -No complaints of chest pain.   Cardiac Monitoring: Box # 5506 in place. Cardiac monitor yields:sinus tachycardia.  Blood pressure 111/74, pulse 104, temperature 98.7 F (37.1 C), temperature source Oral, resp. rate 22, height 6' 0.84" (1.85 m), weight 94.6 kg (208 lb 8.9 oz), SpO2 96.00%.   IV Fluids:  IV in place, occlusive dsg intact without redness, IV cath forearm right, condition patent and no redness and left, condition patent and no redness normal saline.   Allergies:  Peanut-containing drug products and Novocain  Past Medical History:   has a past medical history of Hernia (01/2013) and Kidney stone (2008).  Past Surgical History:   has past surgical history that includes Orchiectomy (Right) and Knee arthroscopy (Left).  Social History:   reports that he quit smoking about 3 years ago. His smoking use included Cigarettes, Pipe, and Cigars. He smoked 1.50 packs per day. He does not have any smokeless tobacco history on file. He reports that he drinks about 0.6 ounces of alcohol per week. He reports that he does not use illicit drugs.  Skin: none  Patient/Family orientated to room. Information packet given to patient/family. Admission inpatient armband information verified with patient/family to include name and date of birth and placed on patient arm. Side rails up x 2, fall assessment and education completed with patient/family. Patient/family able to verbalize understanding of risk associated with falls and verbalized understanding to call for assistance before getting out of bed. Call light within reach. Patient/family able to voice and demonstrate understanding of unit  orientation instructions.    Will continue to evaluate and treat per MD orders.

## 2013-03-29 NOTE — Progress Notes (Signed)
Patient is to be transferred to 5506, report was given to [facility], [place] from 5500. At 1300, patient complaint of burning all-over, he is hot to touched  and chattering noted.  Temp taken, 102.3.  Dr. Timothy Lasso was notified and ordered to give Tylenol and obtain blood culture x 2.  Patietn already on 2 different antibiotics, Vancomycin and Zosyn.  Temp rechecked after Tylenol was given, 100.6 orally.

## 2013-03-30 ENCOUNTER — Inpatient Hospital Stay (HOSPITAL_COMMUNITY): Payer: Medicare Other

## 2013-03-30 DIAGNOSIS — N390 Urinary tract infection, site not specified: Secondary | ICD-10-CM | POA: Diagnosis not present

## 2013-03-30 DIAGNOSIS — N179 Acute kidney failure, unspecified: Secondary | ICD-10-CM | POA: Diagnosis not present

## 2013-03-30 DIAGNOSIS — I9589 Other hypotension: Secondary | ICD-10-CM | POA: Diagnosis not present

## 2013-03-30 DIAGNOSIS — N19 Unspecified kidney failure: Secondary | ICD-10-CM | POA: Diagnosis not present

## 2013-03-30 DIAGNOSIS — R509 Fever, unspecified: Secondary | ICD-10-CM | POA: Diagnosis not present

## 2013-03-30 LAB — CBC
Platelets: 68 10*3/uL — ABNORMAL LOW (ref 150–400)
RBC: 3.8 MIL/uL — ABNORMAL LOW (ref 4.22–5.81)
RDW: 15.2 % (ref 11.5–15.5)
WBC: 17.1 10*3/uL — ABNORMAL HIGH (ref 4.0–10.5)

## 2013-03-30 LAB — CULTURE, BLOOD (ROUTINE X 2)

## 2013-03-30 LAB — CULTURE, BLOOD (SINGLE)

## 2013-03-30 LAB — BASIC METABOLIC PANEL
CO2: 22 mEq/L (ref 19–32)
Chloride: 113 mEq/L — ABNORMAL HIGH (ref 96–112)
Creatinine, Ser: 1.44 mg/dL — ABNORMAL HIGH (ref 0.50–1.35)
GFR calc Af Amer: 57 mL/min — ABNORMAL LOW (ref >90)
Potassium: 3.6 mEq/L (ref 3.5–5.1)
Sodium: 143 mEq/L (ref 135–145)

## 2013-03-30 LAB — URINE CULTURE
Colony Count: >=100000
Special Requests: NORMAL

## 2013-03-30 MED ORDER — CIPROFLOXACIN HCL 500 MG PO TABS
500.0000 mg | ORAL_TABLET | Freq: Two times a day (BID) | ORAL | Status: DC
  Administered 2013-03-30 – 2013-04-01 (×5): 500 mg via ORAL
  Filled 2013-03-30 (×7): qty 1

## 2013-03-30 NOTE — Progress Notes (Signed)
Rapid response came by to look at pt. Pt said that he was feeling a lot better and that he just wanted to be covered. Vitals were taken- 99 temp, 150/93 b/p, 110 pulse, 22 resp, 93 o2 stat.

## 2013-03-30 NOTE — Progress Notes (Signed)
ANTIBIOTIC CONSULT NOTE - FOLLOW UP  Pharmacy Consult for Zosyn Indication: urosepsis  Allergies  Allergen Reactions  . Peanut-Containing Drug Products Anaphylaxis    REACTION: Patient has Epipen to treat this- anaphylaxis shock  . Novocain (Procaine Hcl) Itching and Rash    Patient Measurements: Height: 6' 0.83" (185 cm) Weight: 208 lb 8.9 oz (94.6 kg) IBW/kg (Calculated) : 79.52 Adjusted Body Weight:   Vital Signs: Temp: 97.6 F (36.4 C) (04/04 0528) Temp src: Oral (04/04 0528) BP: 100/64 mmHg (04/04 0528) Pulse Rate: 68 (04/04 0528) Intake/Output from previous day: 04/03 0701 - 04/04 0700 In: 838 [I.V.:588; IV Piggyback:250] Out: 600 [Urine:600] Intake/Output from this shift: Total I/O In: 240 [P.O.:240] Out: -   Labs:  Recent Labs  03/28/13 0614 03/29/13 0518 03/30/13 0555  WBC 17.6* 21.3* 17.1*  HGB 12.2* 11.9* 11.4*  PLT 90* 71* 68*  CREATININE 2.33* 1.65* 1.44*   Estimated Creatinine Clearance: 56.7 ml/min (by C-G formula based on Cr of 1.44). No results found for this basename: VANCOTROUGH, Leodis Binet, VANCORANDOM, GENTTROUGH, GENTPEAK, GENTRANDOM, TOBRATROUGH, TOBRAPEAK, TOBRARND, AMIKACINPEAK, AMIKACINTROU, AMIKACIN,  in the last 72 hours   Microbiology: Recent Results (from the past 720 hour(s))  CULTURE, BLOOD (SINGLE)     Status: None   Collection Time    03/27/13  7:53 PM      Result Value Range Status   Specimen Description BLOOD RIGHT FOREARM   Final   Special Requests NONE BOTTLES DRAWN AEROBIC AND ANAEROBIC EACH   Final   Culture  Setup Time 03/28/2013 01:56   Final   Culture     Final   Value: ESCHERICHIA COLI     Note: Gram Stain Report Called to,Read Back By and Verified With: TATA CARBONE@1052  ON [id number] BY [facility]   Report Status 03/30/2013 FINAL   Final   Organism ID, Bacteria ESCHERICHIA COLI   Final  CULTURE, BLOOD (ROUTINE X 2)     Status: None   Collection Time    03/27/13  8:42 PM      Result Value Range Status   Specimen  Description BLOOD LEFT ANTECUBITAL   Final   Special Requests     Final   Value: Normal BOTTLES DRAWN AEROBIC AND ANAEROBIC 5 ML EACH   Culture  Setup Time 03/28/2013 01:55   Final   Culture     Final   Value: ESCHERICHIA COLI     Note: SUSCEPTIBILITIES PERFORMED ON PREVIOUS CULTURE WITHIN THE LAST 5 DAYS.     Note: Gram Stain Report Called to,Read Back By and Verified With: SAMANTHA WALKER@1200  ON [id number] BY [facility]   Report Status 03/30/2013 FINAL   Final  URINE CULTURE     Status: None   Collection Time    03/27/13 10:31 PM      Result Value Range Status   Specimen Description URINE, CLEAN CATCH   Final   Special Requests Normal   Final   Culture  Setup Time 03/28/2013 07:42   Final   Colony Count >=100,000 COLONIES/ML   Final   Culture ESCHERICHIA COLI   Final   Report Status 03/30/2013 FINAL   Final   Organism ID, Bacteria ESCHERICHIA COLI   Final  MRSA PCR SCREENING     Status: None   Collection Time    03/27/13 11:51 PM      Result Value Range Status   MRSA by PCR NEGATIVE  NEGATIVE Final   Comment:  The GeneXpert MRSA Assay (FDA     approved for NASAL specimens     only), is one component of a     comprehensive MRSA colonization     surveillance program. It is not     intended to diagnose MRSA     infection nor to guide or     monitor treatment for     MRSA infections.  CULTURE, BLOOD (ROUTINE X 2)     Status: None   Collection Time    03/29/13  1:55 PM      Result Value Range Status   Specimen Description BLOOD LEFT ARM   Final   Special Requests BOTTLES DRAWN AEROBIC AND ANAEROBIC 10CC   Final   Culture  Setup Time 03/29/2013 17:38   Final   Culture     Final   Value:        BLOOD CULTURE RECEIVED NO GROWTH TO DATE CULTURE WILL BE HELD FOR 5 DAYS BEFORE ISSUING A FINAL NEGATIVE REPORT   Report Status PENDING   Incomplete  CULTURE, BLOOD (ROUTINE X 2)     Status: None   Collection Time    03/29/13  2:00 PM      Result Value Range Status   Specimen  Description BLOOD LEFT HAND   Final   Special Requests BOTTLES DRAWN AEROBIC AND ANAEROBIC 10CC   Final   Culture  Setup Time 03/29/2013 17:39   Final   Culture     Final   Value:        BLOOD CULTURE RECEIVED NO GROWTH TO DATE CULTURE WILL BE HELD FOR 5 DAYS BEFORE ISSUING A FINAL NEGATIVE REPORT   Report Status PENDING   Incomplete    Anti-infectives   Start     Dose/Rate Route Frequency Ordered Stop   03/30/13 0900  ciprofloxacin (CIPRO) tablet 500 mg     500 mg Oral 2 times daily 03/30/13 0748     03/28/13 1000  vancomycin (VANCOCIN) IVPB 1000 mg/200 mL premix  Status:  Discontinued     1,000 mg 200 mL/hr over 60 Minutes Intravenous Every 24 hours 03/28/13 0126 03/30/13 0748   03/28/13 0200  piperacillin-tazobactam (ZOSYN) IVPB 3.375 g     3.375 g 12.5 mL/hr over 240 Minutes Intravenous Every 8 hours 03/28/13 0126     03/27/13 2215  piperacillin-tazobactam (ZOSYN) IVPB 3.375 g     3.375 g 12.5 mL/hr over 240 Minutes Intravenous  Once 03/27/13 2205 03/28/13 0224   03/27/13 2215  vancomycin (VANCOCIN) IVPB 1000 mg/200 mL premix     1,000 mg 200 mL/hr over 60 Minutes Intravenous  Once 03/27/13 2206 03/27/13 2324      Assessment: 67yo male with EColi urosepsis, pan-sensitive.  Today is day#2 of Zosyn and day#1 Cipro; Vancomycin was d/c'd.  Pt is currently afebrile, with Tm 102.3, and WBC down this AM.  Pltc is down again- possibly due to sepsis though also on Lovenox for VTE px.  Goal of Therapy:  resolution of infection  Plan:  1.  Continue Zosyn 3.375gm IV q8, 4 hour infusion 2.  F/U pltc  Marisue Humble, PharmD Clinical Pharmacist [facility]

## 2013-03-30 NOTE — Progress Notes (Signed)
Subjective: Admitted with E coli Urosepsis - ? Prostatitis. Reports no urinary stream issues. Febrile yesterday and last night. No SOB. Eating OK. Walking OK  Objective: Vital signs in last 24 hours: Temp:  [97.3 F (36.3 C)-102.3 F (39.1 C)] 97.6 F (36.4 C) (04/04 0528) Pulse Rate:  [68-140] 68 (04/04 0528) Resp:  [20-37] 20 (04/04 0528) BP: (100-177)/(64-98) 100/64 mmHg (04/04 0528) SpO2:  [91 %-96 %] 95 % (04/04 0528) Weight change:  Last BM Date: 03/29/13  CBG (last 3)   Recent Labs  03/28/13 0817 03/28/13 1119 03/28/13 1807  GLUCAP 109* 172* 119*    Intake/Output from previous day:  Intake/Output Summary (Last 24 hours) at 03/30/13 0739 Last data filed at 03/29/13 1448  Gross per 24 hour  Intake    838 ml  Output    600 ml  Net    238 ml   04/03 0701 - 04/04 0700 In: 838 [I.V.:588; IV Piggyback:250] Out: 600 [Urine:600]   Physical Exam General appearance: Looks baseline Eyes: no scleral icterus Throat: oropharynx moist without erythema Resp: CTA B Cardio: Reg GI: soft, non-tender; bowel sounds normal; no masses,  no organomegaly Extremities: no clubbing, cyanosis or edema Tattoos noted.   Lab Results:  Recent Labs  03/29/13 0518 03/30/13 0555  NA 142 143  K 3.5 3.6  CL 114* 113*  CO2 18* 22  GLUCOSE 87 105*  BUN 45* 41*  CREATININE 1.65* 1.44*  CALCIUM 8.1* 8.4     Recent Labs  03/27/13 1951 03/28/13 0614  AST 36 31  ALT 31 23  ALKPHOS 286* 65  BILITOT 1.5* 1.5*  PROT 8.1 6.0  ALBUMIN 3.6 2.5*     Recent Labs  03/27/13 1951  03/29/13 0518 03/30/13 0555  WBC 4.6  < > 21.3* 17.1*  NEUTROABS 4.3  --   --   --   HGB 15.7  < > 11.9* 11.4*  HCT 46.1  < > 34.6* 33.7*  MCV 90.9  < > 89.6 88.7  PLT 116*  < > 71* 68*  < > = values in this interval not displayed.  No results found for this basename: INR,  PROTIME     Recent Labs  03/27/13 1953  TROPONINI <0.30    No results found for this basename: TSH, T4TOTAL,  FREET3, T3FREE, THYROIDAB,  in the last 72 hours  No results found for this basename: VITAMINB12, FOLATE, FERRITIN, TIBC, IRON, RETICCTPCT,  in the last 72 hours  Micro Results: Recent Results (from the past 240 hour(s))  CULTURE, BLOOD (SINGLE)     Status: None   Collection Time    03/27/13  7:53 PM      Result Value Range Status   Specimen Description BLOOD RIGHT FOREARM   Final   Special Requests NONE BOTTLES DRAWN AEROBIC AND ANAEROBIC EACH   Final   Culture  Setup Time 03/28/2013 01:56   Final   Culture     Final   Value: ESCHERICHIA COLI     Note: Gram Stain Report Called to,Read Back By and Verified With: TATA CARBONE@1052  ON [id number] BY [facility]   Report Status PENDING   Incomplete  CULTURE, BLOOD (ROUTINE X 2)     Status: None   Collection Time    03/27/13  8:42 PM      Result Value Range Status   Specimen Description BLOOD LEFT ANTECUBITAL   Final   Special Requests     Final   Value: Normal BOTTLES DRAWN AEROBIC  AND ANAEROBIC 5 ML EACH   Culture  Setup Time 03/28/2013 01:55   Final   Culture     Final   Value: ESCHERICHIA COLI     Note: Gram Stain Report Called to,Read Back By and Verified With: SAMANTHA WALKER@1200  ON [id number] BY [facility]   Report Status PENDING   Incomplete  URINE CULTURE     Status: None   Collection Time    03/27/13 10:31 PM      Result Value Range Status   Specimen Description URINE, CLEAN CATCH   Final   Special Requests Normal   Final   Culture  Setup Time 03/28/2013 07:42   Final   Colony Count >=100,000 COLONIES/ML   Final   Culture ESCHERICHIA COLI   Final   Report Status PENDING   Incomplete  MRSA PCR SCREENING     Status: None   Collection Time    03/27/13 11:51 PM      Result Value Range Status   MRSA by PCR NEGATIVE  NEGATIVE Final   Comment:            The GeneXpert MRSA Assay (FDA     approved for NASAL specimens     only), is one component of a     comprehensive MRSA colonization     surveillance program. It is not     intended  to diagnose MRSA     infection nor to guide or     monitor treatment for     MRSA infections.     Studies/Results: Dg Chest 2 View  03/28/2013  *RADIOLOGY REPORT*  Clinical Data: Rales, fever, headache  CHEST - 2 VIEW  Comparison: 03/27/2013  Findings: Normal heart size and pulmonary vascularity. Tortuous thoracic aorta. Bibasilar atelectasis and small right pleural effusion. Lungs otherwise clear. No pneumothorax. No acute osseous findings.  IMPRESSION: Bibasilar atelectasis greater on the right with small right pleural effusion.   Original Report Authenticated By: Ulyses Southward, M.D.      Medications: Scheduled: . aspirin  81 mg Oral Daily  . dutasteride  0.5 mg Oral Daily  . enoxaparin (LOVENOX) injection  40 mg Subcutaneous Q24H  . pantoprazole  40 mg Oral Daily  . piperacillin-tazobactam (ZOSYN)  IV  3.375 g Intravenous Q8H  . sodium chloride  3 mL Intravenous Q12H  . vancomycin  1,000 mg Intravenous Q24H   Continuous: . sodium chloride 40 mL/hr at 03/29/13 1448     Assessment/Plan: Principal Problem:   Sepsis syndrome Active Problems:   Acute renal failure   UTI (urinary tract infection)   Lactic acidosis   BPH (benign prostatic hyperplasia)  1. Ecoli Urosepsis - Await Sensitivities.  Cont Zosyn.  Add Cipro.  D/C Vanco. Acute renal failure improving.  Leukocytosis improving. Continue Saline.  Check Ab [place].   2. Acute renal failure- monitor renal function with hydration. Likely prerenal azotemia +/- ATN secondary to sepsis. Improving. 1.44 3. UTI (urinary tract infection)- treat empirically with Zosyn pending culture data. Add Cipro 4. Lactic acidosis- Better at 1.9 5. BPH (benign prostatic hyperplasia)- I restarted Avodart.  6. DVT prophylaxis- D/C Lovenox with platelets dropping.  SCDss for now. 7. Disposition- anticipate discharge to home by Sat/Sun 8. N/V/Fever - now up and down.  Re-cultured yesterday 9. Reg diet. 10 transfer to floor/Ambulate/Await Culture Data and  work toward a D/C 11. Thrombocytopenia - monitor.  At 68 due to sepsis? vrs HIT?  No Bleeding 12. Hyperglycemia. Monitor.   ID -  Anti-infectives  Start     Dose/Rate Route Frequency Ordered Stop   03/28/13 1000  vancomycin (VANCOCIN) IVPB 1000 mg/200 mL premix     1,000 mg 200 mL/hr over 60 Minutes Intravenous Every 24 hours 03/28/13 0126     03/28/13 0200  piperacillin-tazobactam (ZOSYN) IVPB 3.375 g     3.375 g 12.5 mL/hr over 240 Minutes Intravenous Every 8 hours 03/28/13 0126     03/27/13 2215  piperacillin-tazobactam (ZOSYN) IVPB 3.375 g     3.375 g 12.5 mL/hr over 240 Minutes Intravenous  Once 03/27/13 2205 03/28/13 0224   03/27/13 2215  vancomycin (VANCOCIN) IVPB 1000 mg/200 mL premix     1,000 mg 200 mL/hr over 60 Minutes Intravenous  Once 03/27/13 2206 03/27/13 2324        LOS: 3 days   Leoda Smithhart M 03/30/2013, 7:39 AM

## 2013-03-31 DIAGNOSIS — N19 Unspecified kidney failure: Secondary | ICD-10-CM | POA: Diagnosis not present

## 2013-03-31 DIAGNOSIS — I9589 Other hypotension: Secondary | ICD-10-CM | POA: Diagnosis not present

## 2013-03-31 DIAGNOSIS — N400 Enlarged prostate without lower urinary tract symptoms: Secondary | ICD-10-CM | POA: Diagnosis not present

## 2013-03-31 DIAGNOSIS — A419 Sepsis, unspecified organism: Secondary | ICD-10-CM | POA: Diagnosis not present

## 2013-03-31 DIAGNOSIS — N200 Calculus of kidney: Secondary | ICD-10-CM | POA: Diagnosis not present

## 2013-03-31 DIAGNOSIS — R509 Fever, unspecified: Secondary | ICD-10-CM | POA: Diagnosis not present

## 2013-03-31 DIAGNOSIS — N179 Acute kidney failure, unspecified: Secondary | ICD-10-CM | POA: Diagnosis not present

## 2013-03-31 DIAGNOSIS — N390 Urinary tract infection, site not specified: Secondary | ICD-10-CM | POA: Diagnosis not present

## 2013-03-31 DIAGNOSIS — N1330 Unspecified hydronephrosis: Secondary | ICD-10-CM | POA: Diagnosis not present

## 2013-03-31 LAB — BASIC METABOLIC PANEL
Calcium: 8.3 mg/dL — ABNORMAL LOW (ref 8.4–10.5)
Creatinine, Ser: 1.65 mg/dL — ABNORMAL HIGH (ref 0.50–1.35)
GFR calc Af Amer: 48 mL/min — ABNORMAL LOW (ref >90)
GFR calc non Af Amer: 42 mL/min — ABNORMAL LOW (ref >90)
Sodium: 143 mEq/L (ref 135–145)

## 2013-03-31 LAB — CBC
MCH: 30.2 pg (ref 26.0–34.0)
MCV: 87.8 fL (ref 78.0–100.0)
Platelets: 66 10*3/uL — ABNORMAL LOW (ref 150–400)
RDW: 15.1 % (ref 11.5–15.5)
WBC: 11.5 10*3/uL — ABNORMAL HIGH (ref 4.0–10.5)

## 2013-03-31 MED ORDER — POTASSIUM CHLORIDE CRYS ER 20 MEQ PO TBCR
40.0000 meq | EXTENDED_RELEASE_TABLET | Freq: Two times a day (BID) | ORAL | Status: DC
  Administered 2013-03-31 – 2013-04-01 (×3): 40 meq via ORAL
  Filled 2013-03-31 (×4): qty 2

## 2013-03-31 NOTE — Progress Notes (Signed)
Subjective: Admitted with E coli Urosepsis - ? Prostatitis. Reports no urinary stream issues. No more fevers yesterday - today No SOB. Eating OK. Walking OK Already been bathed  Objective: Vital signs in last 24 hours: Temp:  [98.2 F (36.8 C)-99.2 F (37.3 C)] 98.7 F (37.1 C) (04/05 0420) Pulse Rate:  [76-78] 76 (04/05 0420) Resp:  [18-20] 18 (04/05 0420) BP: (123-127)/(74-82) 126/81 mmHg (04/05 0420) SpO2:  [96 %] 96 % (04/05 0420) Weight change:  Last BM Date: 03/29/13  CBG (last 3)   Recent Labs  03/28/13 0817 03/28/13 1119 03/28/13 1807  GLUCAP 109* 172* 119*    Intake/Output from previous day:  Intake/Output Summary (Last 24 hours) at 03/31/13 0717 Last data filed at 03/30/13 1344  Gross per 24 hour  Intake    360 ml  Output      0 ml  Net    360 ml   04/04 0701 - 04/05 0700 In: 360 [P.O.:360] Out: -    Physical Exam General appearance: Looks baseline Eyes: no scleral icterus Throat: oropharynx moist without erythema Resp: CTA B Cardio: Reg GI: soft, non-tender; bowel sounds normal; no masses,  no organomegaly Extremities: no clubbing, cyanosis or edema Tattoos noted.   Lab Results:  Recent Labs  03/30/13 0555 03/31/13 0509  NA 143 143  K 3.6 3.3*  CL 113* 114*  CO2 22 22  GLUCOSE 105* 114*  BUN 41* 35*  CREATININE 1.44* 1.65*  CALCIUM 8.4 8.3*    No results found for this basename: AST, ALT, ALKPHOS, BILITOT, PROT, ALBUMIN,  in the last 72 hours   Recent Labs  03/30/13 0555 03/31/13 0509  WBC 17.1* 11.5*  HGB 11.4* 11.4*  HCT 33.7* 33.2*  MCV 88.7 87.8  PLT 68* 66*    No results found for this basename: INR,  PROTIME    No results found for this basename: CKTOTAL, CKMB, CKMBINDEX, TROPONINI,  in the last 72 hours  No results found for this basename: TSH, T4TOTAL, FREET3, T3FREE, THYROIDAB,  in the last 72 hours  No results found for this basename: VITAMINB12, FOLATE, FERRITIN, TIBC, IRON, RETICCTPCT,  in the last 72  hours  Micro Results: Recent Results (from the past 240 hour(s))  CULTURE, BLOOD (SINGLE)     Status: None   Collection Time    03/27/13  7:53 PM      Result Value Range Status   Specimen Description BLOOD RIGHT FOREARM   Final   Special Requests NONE BOTTLES DRAWN AEROBIC AND ANAEROBIC EACH   Final   Culture  Setup Time 03/28/2013 01:56   Final   Culture     Final   Value: ESCHERICHIA COLI     Note: Gram Stain Report Called to,Read Back By and Verified With: TATA CARBONE@1052  ON [id number] BY [facility]   Report Status 03/30/2013 FINAL   Final   Organism ID, Bacteria ESCHERICHIA COLI   Final  CULTURE, BLOOD (ROUTINE X 2)     Status: None   Collection Time    03/27/13  8:42 PM      Result Value Range Status   Specimen Description BLOOD LEFT ANTECUBITAL   Final   Special Requests     Final   Value: Normal BOTTLES DRAWN AEROBIC AND ANAEROBIC 5 ML EACH   Culture  Setup Time 03/28/2013 01:55   Final   Culture     Final   Value: ESCHERICHIA COLI     Note: SUSCEPTIBILITIES PERFORMED ON PREVIOUS CULTURE WITHIN  THE LAST 5 DAYS.     Note: Gram Stain Report Called to,Read Back By and Verified With: SAMANTHA WALKER@1200  ON [id number] BY [facility]   Report Status 03/30/2013 FINAL   Final  URINE CULTURE     Status: None   Collection Time    03/27/13 10:31 PM      Result Value Range Status   Specimen Description URINE, CLEAN CATCH   Final   Special Requests Normal   Final   Culture  Setup Time 03/28/2013 07:42   Final   Colony Count >=100,000 COLONIES/ML   Final   Culture ESCHERICHIA COLI   Final   Report Status 03/30/2013 FINAL   Final   Organism ID, Bacteria ESCHERICHIA COLI   Final  MRSA PCR SCREENING     Status: None   Collection Time    03/27/13 11:51 PM      Result Value Range Status   MRSA by PCR NEGATIVE  NEGATIVE Final   Comment:            The GeneXpert MRSA Assay (FDA     approved for NASAL specimens     only), is one component of a     comprehensive MRSA colonization      surveillance program. It is not     intended to diagnose MRSA     infection nor to guide or     monitor treatment for     MRSA infections.  CULTURE, BLOOD (ROUTINE X 2)     Status: None   Collection Time    03/29/13  1:55 PM      Result Value Range Status   Specimen Description BLOOD LEFT ARM   Final   Special Requests BOTTLES DRAWN AEROBIC AND ANAEROBIC 10CC   Final   Culture  Setup Time 03/29/2013 17:38   Final   Culture     Final   Value:        BLOOD CULTURE RECEIVED NO GROWTH TO DATE CULTURE WILL BE HELD FOR 5 DAYS BEFORE ISSUING A FINAL NEGATIVE REPORT   Report Status PENDING   Incomplete  CULTURE, BLOOD (ROUTINE X 2)     Status: None   Collection Time    03/29/13  2:00 PM      Result Value Range Status   Specimen Description BLOOD LEFT HAND   Final   Special Requests BOTTLES DRAWN AEROBIC AND ANAEROBIC 10CC   Final   Culture  Setup Time 03/29/2013 17:39   Final   Culture     Final   Value:        BLOOD CULTURE RECEIVED NO GROWTH TO DATE CULTURE WILL BE HELD FOR 5 DAYS BEFORE ISSUING A FINAL NEGATIVE REPORT   Report Status PENDING   Incomplete     Studies/Results: US Renal  03/30/2013  *RADIOLOGY REPORT*  Clinical Data: Acute renal failure, sepsis, fever, past history kidney stones shadowing  RENAL/URINARY TRACT ULTRASOUND COMPLETE  Comparison:  02/08/2011  Findings:  Right Kidney:  13.4 cm length.  Normal cortical thickness and echogenicity.  Mild right hydronephrosis, new versus prior study. No mass or shadowing calcification.  Left Kidney:  13.3 cm length.  Normal cortical thickness and echogenicity.  17 mm shadowing calculus inferior pole left kidney, not seen on previous exam.  No hydronephrosis or mass lesion.  Bladder:  Decompressed due to preceding voiding, unable to adequately assess.  Prostate gland enlarged 5.1 x 3.8 x 5.2 cm.  IMPRESSION: Enlarged prostate gland. Mild right hydronephrosis of uncertain  etiology, new. 17 mm nonobstructing shadowing calculus at inferior  pole left kidney, new.   Original Report Authenticated By: Ulyses Southward, Walker.D.      Medications: Scheduled: . aspirin  81 mg Oral Daily  . ciprofloxacin  500 mg Oral BID  . dutasteride  0.5 mg Oral Daily  . pantoprazole  40 mg Oral Daily  . piperacillin-tazobactam (ZOSYN)  IV  3.375 g Intravenous Q8H  . sodium chloride  3 mL Intravenous Q12H   Continuous: . sodium chloride 40 mL/hr at 03/31/13 0606     Assessment/Plan: Principal Problem:   Sepsis syndrome Active Problems:   Acute renal failure   UTI (urinary tract infection)   Lactic acidosis   BPH (benign prostatic hyperplasia)  1. PanSensitive Ecoli Urosepsis -Give 1-2 more doses of Zosyn then we can just do Cipro.  Off Vanco. Acute renal failure was improving then bumped a little in am.  Leukocytosis improving. Continue Saline.  His URO is at [facility] Dr Edwyna Shell (last saw them late 2012) - I was mistaken that he never saw Dr Phoebe Perch but he is considering changing to one of the [facility].  Due to [facility] and nephrolith on [place] - I consulted Dr Isabel Caprice to help me - he will see pt today.  Unless Dr Isabel Caprice says o/w i plan on D/Cing him home tomorrow on PO Cipro for 2 weeks and to Re-Check [place] in 1-2 weeks.   2. Acute renal failure- Continue hydration for 1 more day as Cr bumped a bit this am.  He was admitted c likely prerenal azotemia +/- ATN secondary to sepsis.  3. UTI (urinary tract infection)-  See above 4. Lactic acidosis- Resolved 5. BPH (benign prostatic hyperplasia)- I restarted Avodart and await Dr Noralee Chars input.  6. DVT prophylaxis- D/C Lovenox with platelets dropping.  SCDss for now. 7. Disposition- anticipate discharge to home by Sun 8. N/V/Fever - now up and down.  Re-cultured  Friday 9 Thrombocytopenia - monitor.  At 66-due to sepsis? vrs HIT?  No Bleeding. Lovenox stopped.  No petechiae.  Hold on Heme consult unless.. 10 Hperglycemia. Monitor. 11 hypokalemia - Replete.   ID -  Anti-infectives   Start     Dose/Rate Route  Frequency Ordered Stop   03/30/13 0900  ciprofloxacin (CIPRO) tablet 500 mg     500 mg Oral 2 times daily 03/30/13 0748     03/28/13 1000  vancomycin (VANCOCIN) IVPB 1000 mg/200 mL premix  Status:  Discontinued     1,000 mg 200 mL/hr over 60 Minutes Intravenous Every 24 hours 03/28/13 0126 03/30/13 0748   03/28/13 0200  piperacillin-tazobactam (ZOSYN) IVPB 3.375 g     3.375 g 12.5 mL/hr over 240 Minutes Intravenous Every 8 hours 03/28/13 0126     03/27/13 2215  piperacillin-tazobactam (ZOSYN) IVPB 3.375 g     3.375 g 12.5 mL/hr over 240 Minutes Intravenous  Once 03/27/13 2205 03/28/13 0224   03/27/13 2215  vancomycin (VANCOCIN) IVPB 1000 mg/200 mL premix     1,000 mg 200 mL/hr over 60 Minutes Intravenous  Once 03/27/13 2206 03/27/13 2324        LOS: 4 days   Eddie Walker 03/31/2013, 7:17 AM

## 2013-03-31 NOTE — Consult Note (Signed)
Urology Consult  Referring physician: Creola Corn, M.D. Reason for referral: Urosepsis, BPH, history of elevated PSA, right hydronephrosis, left renal calculus  History of Present Illness: [name] is a 67 year old male with a number of prior urologic issues. He is received care with Dr. Edwyna Shell in [place]. He has had a prior history of elevated PSA apparently has undergone 2 prior negative prostate biopsies. In the past he was on Avodart for BPH but stopped taking that medication 9-12 months ago. He tells me he was also known to have a stone in his left kidney which was being observed. The patient developed sudden onset of nausea vomiting with fever and chills. He also experienced some dysuria and some darkening of urine. Clinically it was apparent that the patient had urosepsis. Urine cultures and blood cultures were positive for Escherichia coli which was fairly pansensitive. The patient is had a nice clinical recovery and feels substantially better at this time. He denies any prior episodes of prostatitis/cystitis. He is currently without any significant voiding complaints. As a baseline he does not have much the way of obstructive or irrigated symptoms despite the prostatic enlargement noted. The patient does appear to have some mild renal insufficiency with a creatinine recently of 1.6. This is improved from his initial presentation. The patient did undergo renal ultrasonography which showed some mild right hydronephrosis of uncertain etiology. In the left kidney there was a nonobstructing 17 mm stone in the lower pole. The patient was thought to have an enlarged prostate gland but no significant post void residual. He is close to being able to be discharged on appropriate oral antibiotics but because of a slight bump in his creatinine he is to be watched another day.  Past Medical History  Diagnosis Date  . Hernia 01/2013  . Kidney stone 2008   Past Surgical History  Procedure Laterality  Date  . Orchiectomy Right   . Knee arthroscopy Left     Medications:  Scheduled: . aspirin  81 mg Oral Daily  . ciprofloxacin  500 mg Oral BID  . dutasteride  0.5 mg Oral Daily  . pantoprazole  40 mg Oral Daily  . piperacillin-tazobactam (ZOSYN)  IV  3.375 g Intravenous Q8H  . potassium chloride  40 mEq Oral BID  . sodium chloride  3 mL Intravenous Q12H    Allergies:  Allergies  Allergen Reactions  . Peanut-Containing Drug Products Anaphylaxis    REACTION: Patient has Epipen to treat this- anaphylaxis shock  . Novocain (Procaine Hcl) Itching and Rash    History reviewed. No pertinent family history.  Social History:  reports that he quit smoking about 3 years ago. His smoking use included Cigarettes, Pipe, and Cigars. He smoked 1.50 packs per day. He does not have any smokeless tobacco history on file. He reports that he drinks about 0.6 ounces of alcohol per week. He reports that he does not use illicit drugs.  ROS: The patient currently is without significant complaints. Again at the time of admission he did have fever chills, nausea, questionable gross hematuria with dysuria.  Physical Exam:  Vital signs in last 24 hours: Temp:  [98.2 F (36.8 C)-99.2 F (37.3 C)] 98.7 F (37.1 C) (04/05 0420) Pulse Rate:  [76-78] 76 (04/05 0420) Resp:  [18-20] 18 (04/05 0420) BP: (123-127)/(74-82) 126/81 mmHg (04/05 0420) SpO2:  [96 %] 96 % (04/05 0420)  Constitutional: Vital signs reviewed. WD WN in NAD Head: Normocephalic and atraumatic   Eyes: PERRL, No scleral icterus.  Neck:  Supple No  Gross JVD, mass, thyromegaly, or carotid bruit present.  Cardiovascular: RRR Pulmonary/Chest: Normal effort Abdominal: Soft. Non-tender, non-distended, bowel sounds are normal, no masses, organomegaly, or guarding present.  Genitourinary: 2+ prostate without nodules or induration.  Extremities: No cyanosis or edema  Neurological: Grossly non-focal.  Skin: Warm,very dry and intact. No rash,  cyanosis   Laboratory Data:  Results for orders placed during the hospital encounter of 03/27/13 (from the past 72 hour(s))  GLUCOSE, CAPILLARY     Status: Abnormal   Collection Time    03/28/13 11:19 AM      Result Value Range   Glucose-Capillary 172 (*) 70 - 99 mg/dL  GLUCOSE, CAPILLARY     Status: Abnormal   Collection Time    03/28/13  6:07 PM      Result Value Range   Glucose-Capillary 119 (*) 70 - 99 mg/dL  CBC     Status: Abnormal   Collection Time    03/29/13  5:18 AM      Result Value Range   WBC 21.3 (*) 4.0 - 10.5 K/uL   RBC 3.86 (*) 4.22 - 5.81 MIL/uL   Hemoglobin 11.9 (*) 13.0 - 17.0 g/dL   HCT 96.0 (*) 45.4 - 09.8 %   MCV 89.6  78.0 - 100.0 fL   MCH 30.8  26.0 - 34.0 pg   MCHC 34.4  30.0 - 36.0 g/dL   RDW 11.9  14.7 - 82.9 %   Platelets 71 (*) 150 - 400 K/uL   Comment: CONSISTENT WITH PREVIOUS RESULT  BASIC METABOLIC PANEL     Status: Abnormal   Collection Time    03/29/13  5:18 AM      Result Value Range   Sodium 142  135 - 145 mEq/L   Potassium 3.5  3.5 - 5.1 mEq/L   Chloride 114 (*) 96 - 112 mEq/L   CO2 18 (*) 19 - 32 mEq/L   Glucose, Bld 87  70 - 99 mg/dL   BUN 45 (*) 6 - 23 mg/dL   Creatinine, Ser 5.62 (*) 0.50 - 1.35 mg/dL   Calcium 8.1 (*) 8.4 - 10.5 mg/dL   GFR calc non Af Amer 42 (*) >90 mL/min   GFR calc Af Amer 48 (*) >90 mL/min   Comment:            The eGFR has been calculated     using the CKD EPI equation.     This calculation has not been     validated in all clinical     situations.     eGFR's persistently     <90 mL/min signify     possible Chronic Kidney Disease.  LACTIC ACID, PLASMA     Status: None   Collection Time    03/29/13  5:18 AM      Result Value Range   Lactic Acid, Venous 1.9  0.5 - 2.2 mmol/L  CULTURE, BLOOD (ROUTINE X 2)     Status: None   Collection Time    03/29/13  1:55 PM      Result Value Range   Specimen Description BLOOD LEFT ARM     Special Requests BOTTLES DRAWN AEROBIC AND ANAEROBIC 10CC      Culture  Setup Time 03/29/2013 17:38     Culture       Value:        BLOOD CULTURE RECEIVED NO GROWTH TO DATE CULTURE WILL BE HELD FOR 5 DAYS BEFORE ISSUING A FINAL  NEGATIVE REPORT   Report Status PENDING    CULTURE, BLOOD (ROUTINE X 2)     Status: None   Collection Time    03/29/13  2:00 PM      Result Value Range   Specimen Description BLOOD LEFT HAND     Special Requests BOTTLES DRAWN AEROBIC AND ANAEROBIC 10CC     Culture  Setup Time 03/29/2013 17:39     Culture       Value:        BLOOD CULTURE RECEIVED NO GROWTH TO DATE CULTURE WILL BE HELD FOR 5 DAYS BEFORE ISSUING A FINAL NEGATIVE REPORT   Report Status PENDING    CBC     Status: Abnormal   Collection Time    03/30/13  5:55 AM      Result Value Range   WBC 17.1 (*) 4.0 - 10.5 K/uL   RBC 3.80 (*) 4.22 - 5.81 MIL/uL   Hemoglobin 11.4 (*) 13.0 - 17.0 g/dL   HCT 16.1 (*) 09.6 - 04.5 %   MCV 88.7  78.0 - 100.0 fL   MCH 30.0  26.0 - 34.0 pg   MCHC 33.8  30.0 - 36.0 g/dL   RDW 40.9  81.1 - 91.4 %   Platelets 68 (*) 150 - 400 K/uL   Comment: CONSISTENT WITH PREVIOUS RESULT  BASIC METABOLIC PANEL     Status: Abnormal   Collection Time    03/30/13  5:55 AM      Result Value Range   Sodium 143  135 - 145 mEq/L   Potassium 3.6  3.5 - 5.1 mEq/L   Chloride 113 (*) 96 - 112 mEq/L   CO2 22  19 - 32 mEq/L   Glucose, Bld 105 (*) 70 - 99 mg/dL   BUN 41 (*) 6 - 23 mg/dL   Creatinine, Ser 7.82 (*) 0.50 - 1.35 mg/dL   Calcium 8.4  8.4 - 95.6 mg/dL   GFR calc non Af Amer 49 (*) >90 mL/min   GFR calc Af Amer 57 (*) >90 mL/min   Comment:            The eGFR has been calculated     using the CKD EPI equation.     This calculation has not been     validated in all clinical     situations.     eGFR's persistently     <90 mL/min signify     possible Chronic Kidney Disease.  CBC     Status: Abnormal   Collection Time    03/31/13  5:09 AM      Result Value Range   WBC 11.5 (*) 4.0 - 10.5 K/uL   RBC 3.78 (*) 4.22 - 5.81 MIL/uL    Hemoglobin 11.4 (*) 13.0 - 17.0 g/dL   HCT 21.3 (*) 08.6 - 57.8 %   MCV 87.8  78.0 - 100.0 fL   MCH 30.2  26.0 - 34.0 pg   MCHC 34.3  30.0 - 36.0 g/dL   RDW 46.9  62.9 - 52.8 %   Platelets 66 (*) 150 - 400 K/uL   Comment: CONSISTENT WITH PREVIOUS RESULT  BASIC METABOLIC PANEL     Status: Abnormal   Collection Time    03/31/13  5:09 AM      Result Value Range   Sodium 143  135 - 145 mEq/L   Potassium 3.3 (*) 3.5 - 5.1 mEq/L   Chloride 114 (*) 96 - 112 mEq/L   CO2  22  19 - 32 mEq/L   Glucose, Bld 114 (*) 70 - 99 mg/dL   BUN 35 (*) 6 - 23 mg/dL   Creatinine, Ser 1.61 (*) 0.50 - 1.35 mg/dL   Calcium 8.3 (*) 8.4 - 10.5 mg/dL   GFR calc non Af Amer 42 (*) >90 mL/min   GFR calc Af Amer 48 (*) >90 mL/min   Comment:            The eGFR has been calculated     using the CKD EPI equation.     This calculation has not been     validated in all clinical     situations.     eGFR's persistently     <90 mL/min signify     possible Chronic Kidney Disease.   Recent Results (from the past 240 hour(s))  CULTURE, BLOOD (SINGLE)     Status: None   Collection Time    03/27/13  7:53 PM      Result Value Range Status   Specimen Description BLOOD RIGHT FOREARM   Final   Special Requests NONE BOTTLES DRAWN AEROBIC AND ANAEROBIC EACH   Final   Culture  Setup Time 03/28/2013 01:56   Final   Culture     Final   Value: ESCHERICHIA COLI     Note: Gram Stain Report Called to,Read Back By and Verified With: TATA CARBONE@1052  ON [id number] BY [facility]   Report Status 03/30/2013 FINAL   Final   Organism ID, Bacteria ESCHERICHIA COLI   Final  CULTURE, BLOOD (ROUTINE X 2)     Status: None   Collection Time    03/27/13  8:42 PM      Result Value Range Status   Specimen Description BLOOD LEFT ANTECUBITAL   Final   Special Requests     Final   Value: Normal BOTTLES DRAWN AEROBIC AND ANAEROBIC 5 ML EACH   Culture  Setup Time 03/28/2013 01:55   Final   Culture     Final   Value: ESCHERICHIA COLI     Note:  SUSCEPTIBILITIES PERFORMED ON PREVIOUS CULTURE WITHIN THE LAST 5 DAYS.     Note: Gram Stain Report Called to,Read Back By and Verified With: Domingo Pulse  ON [id number] BY [facility]   Report Status 03/30/2013 FINAL   Final  URINE CULTURE     Status: None   Collection Time    03/27/13 10:31 PM      Result Value Range Status   Specimen Description URINE, CLEAN CATCH   Final   Special Requests Normal   Final   Culture  Setup Time 03/28/2013 07:42   Final   Colony Count >=100,000 COLONIES/ML   Final   Culture ESCHERICHIA COLI   Final   Report Status 03/30/2013 FINAL   Final   Organism ID, Bacteria ESCHERICHIA COLI   Final  MRSA PCR SCREENING     Status: None   Collection Time    03/27/13 11:51 PM      Result Value Range Status   MRSA by PCR NEGATIVE  NEGATIVE Final   Comment:            The GeneXpert MRSA Assay (FDA     approved for NASAL specimens     only), is one component of a     comprehensive MRSA colonization     surveillance program. It is not     intended to diagnose MRSA     infection nor to guide or  monitor treatment for     MRSA infections.  CULTURE, BLOOD (ROUTINE X 2)     Status: None   Collection Time    03/29/13  1:55 PM      Result Value Range Status   Specimen Description BLOOD LEFT ARM   Final   Special Requests BOTTLES DRAWN AEROBIC AND ANAEROBIC 10CC   Final   Culture  Setup Time 03/29/2013 17:38   Final   Culture     Final   Value:        BLOOD CULTURE RECEIVED NO GROWTH TO DATE CULTURE WILL BE HELD FOR 5 DAYS BEFORE ISSUING A FINAL NEGATIVE REPORT   Report Status PENDING   Incomplete  CULTURE, BLOOD (ROUTINE X 2)     Status: None   Collection Time    03/29/13  2:00 PM      Result Value Range Status   Specimen Description BLOOD LEFT HAND   Final   Special Requests BOTTLES DRAWN AEROBIC AND ANAEROBIC 10CC   Final   Culture  Setup Time 03/29/2013 17:39   Final   Culture     Final   Value:        BLOOD CULTURE RECEIVED NO GROWTH TO DATE CULTURE WILL BE  HELD FOR 5 DAYS BEFORE ISSUING A FINAL NEGATIVE REPORT   Report Status PENDING   Incomplete   Creatinine:  Recent Labs  03/27/13 1951 03/28/13 0614 03/29/13 0518 03/30/13 0555 03/31/13 0509  CREATININE 2.40* 2.33* 1.65* 1.44* 1.65*   Baseline Creatinine:   Impression/Assessment:  Urosepsis presumptively secondary to acute prostatitis. It is unlikely that a nonobstructing left renal stone was an issue with his clinical presentation. Given the mild right hydronephrosis it is possible that the patient had a small right ureteral calculus that has indeed subsequently passed which may have been a contributing factor to urosepsis. The patient also has nonobstructing nephrolithiasis.  Plan:  At this point Mr. Kimble has had substantial clinical improvement. He will need to be discharged with approximately 2 weeks of appropriate oral antibiotic therapy. He expresses interest in following up locally with our group. He should contact our office for a followup visit 2-4 weeks status post his discharge. We will need to obtain the old records from his outside urologist. He will need a followup urinalysis to make sure there is no ongoing prostatitis. We will need to perform a followup limited right renal ultrasound to make certain that the hydronephrosis has resolved. If it is not and his creatinine allows that he should have a CT scan with IV contrast to further assess that hydronephrosis. We will need down the road to get repeat PSA data but this would be a poor time to do so given the recent prostatitis. He does not appear that he needs to be on an alpha-blocker given his current voiding status and good emptying. If his voiding situation worsens he would be a good candidate for Flomax. With regard to his left renal stone it does not appear to be causing any clinical problems. Would like to get old records to compare how large the stone was in the past and then most likely just observe this unless there  has been substantial growth. Avid Guillette S 03/31/2013, 10:54 AM

## 2013-04-01 DIAGNOSIS — N390 Urinary tract infection, site not specified: Secondary | ICD-10-CM | POA: Diagnosis not present

## 2013-04-01 DIAGNOSIS — R509 Fever, unspecified: Secondary | ICD-10-CM | POA: Diagnosis not present

## 2013-04-01 DIAGNOSIS — I9589 Other hypotension: Secondary | ICD-10-CM | POA: Diagnosis not present

## 2013-04-01 DIAGNOSIS — N179 Acute kidney failure, unspecified: Secondary | ICD-10-CM | POA: Diagnosis not present

## 2013-04-01 DIAGNOSIS — N19 Unspecified kidney failure: Secondary | ICD-10-CM | POA: Diagnosis not present

## 2013-04-01 LAB — BASIC METABOLIC PANEL
Chloride: 113 mEq/L — ABNORMAL HIGH (ref 96–112)
Creatinine, Ser: 1.43 mg/dL — ABNORMAL HIGH (ref 0.50–1.35)
GFR calc Af Amer: 57 mL/min — ABNORMAL LOW (ref >90)
Potassium: 4 mEq/L (ref 3.5–5.1)
Sodium: 144 mEq/L (ref 135–145)

## 2013-04-01 LAB — CBC
Platelets: 79 10*3/uL — ABNORMAL LOW (ref 150–400)
RBC: 3.98 MIL/uL — ABNORMAL LOW (ref 4.22–5.81)
RDW: 15.1 % (ref 11.5–15.5)
WBC: 13.2 10*3/uL — ABNORMAL HIGH (ref 4.0–10.5)

## 2013-04-01 MED ORDER — CIPROFLOXACIN HCL 500 MG PO TABS: 500.0000 mg | ORAL_TABLET | Freq: Two times a day (BID) | ORAL | Status: DC

## 2013-04-01 MED ORDER — ACETAMINOPHEN 325 MG PO TABS: 650.0000 mg | ORAL_TABLET | Freq: Four times a day (QID) | ORAL | Status: DC | PRN

## 2013-04-01 NOTE — Discharge Summary (Signed)
Physician Discharge Summary  DISCHARGE SUMMARY   Patient ID: Eddie Walker MR#: [id number] DOB/AGE: 1946/02/15 67 y.o.   Attending Physician:Adamaris King M  Patient's PCP:No primary provider on file.  Consults:Treatment Team:  Valetta Fuller, MD**  Admit date: 03/27/2013 Discharge date: 04/01/2013  Discharge Diagnoses:  Principal Problem:   Sepsis syndrome Active Problems:   Acute renal failure   UTI (urinary tract infection)   Lactic acidosis   BPH (benign prostatic hyperplasia)   Patient Active Problem List  Diagnosis  . HEARTBURN  . Acute renal failure  . Sepsis syndrome  . UTI (urinary tract infection)  . Lactic acidosis  . BPH (benign prostatic hyperplasia)   Past Medical History  Diagnosis Date  . Hernia 01/2013  . Kidney stone 2008    Discharged Condition: stable   Discharge Medications:   Medication List    TAKE these medications       acetaminophen 325 MG tablet  Commonly known as:  TYLENOL  Take 2 tablets (650 mg total) by mouth every 6 (six) hours as needed for pain or fever.     aspirin 81 MG tablet  Take 81 mg by mouth daily.     ciprofloxacin 500 MG tablet  Commonly known as:  CIPRO  Take 1 tablet (500 mg total) by mouth 2 (two) times daily.     dutasteride 0.5 MG capsule  Commonly known as:  AVODART  Take 0.5 mg by mouth daily.     glucosamine-chondroitin 500-400 MG tablet  Take 1 tablet by mouth 3 (three) times daily.     multivitamin with minerals Tabs  Take 1 tablet by mouth daily.     omeprazole 20 MG capsule  Commonly known as:  PRILOSEC  Take 20 mg by mouth daily.        Hospital Procedures: Dg Chest 2 View  03/28/2013  *RADIOLOGY REPORT*  Clinical Data: Rales, fever, headache  CHEST - 2 VIEW  Comparison: 03/27/2013  Findings: Normal heart size and pulmonary vascularity. Tortuous thoracic aorta. Bibasilar atelectasis and small right pleural effusion. Lungs otherwise clear. No pneumothorax. No acute osseous findings.   IMPRESSION: Bibasilar atelectasis greater on the right with small right pleural effusion.   Original Report Authenticated By: Ulyses Southward, M.D.    Dg Chest 2 View  03/27/2013  *RADIOLOGY REPORT*  Clinical Data: Fever, tachycardia  CHEST - 2 VIEW  Comparison: None.  Findings: Normal cardiac silhouette and mediastinal contours.  No focal parenchymal opacity.  No pleural effusion or pneumothorax. No acute osseous abnormalities.  Post cholecystectomy.  IMPRESSION: No acute cardiopulmonary disease.   Original Report Authenticated By: Tacey Ruiz, MD    US Renal  03/30/2013  *RADIOLOGY REPORT*  Clinical Data: Acute renal failure, sepsis, fever, past history kidney stones shadowing  RENAL/URINARY TRACT ULTRASOUND COMPLETE  Comparison:  02/08/2011  Findings:  Right Kidney:  13.4 cm length.  Normal cortical thickness and echogenicity.  Mild right hydronephrosis, new versus prior study. No mass or shadowing calcification.  Left Kidney:  13.3 cm length.  Normal cortical thickness and echogenicity.  17 mm shadowing calculus inferior pole left kidney, not seen on previous exam.  No hydronephrosis or mass lesion.  Bladder:  Decompressed due to preceding voiding, unable to adequately assess.  Prostate gland enlarged 5.1 x 3.8 x 5.2 cm.  IMPRESSION: Enlarged prostate gland. Mild right hydronephrosis of uncertain etiology, new. 17 mm nonobstructing shadowing calculus at inferior pole left kidney, new.   Original Report Authenticated By: Ulyses Southward, M.D.  History of Present Illness: [name] is a remarkably healthy 67 year old white male with a history of BPH who presented to the [facility] emergency department for evaluation of fever and chills. Patient was in his usual state of health until 2 days PTA when he developed nausea and vomiting associated with fever and chills. His initial fever was up to 100.1. He had multiple episodes of nausea and vomiting over the ensuing 24 hours. On the morning of  admit, he felt like he was improving and was able to keep down some liquids. He actually left the house for a brief period of time. However, later that evening he developed a recurrent fever and shaking chills. His wife, who works in the vascular lab, attempted to check his vital signs but had difficulty obtaining a blood pressure. She made him go to the emergency department where he had a temperature of 100.8, blood pressure 92/64, and heart rate of 164. Given these hemodynamic abnormalities, he was transferred to [facility] step down bed for further management of an acute febrile illness. He was given a dose of empiric vancomycin and Zosyn prior to transfer after I spoke with the ED physician.  The patient denied any specific symptoms or source of fever other than N/V. He denies diarrhea. Mild dysuria but no urinary frequency or flank pain. He has not had a history of recurrent UTIs but does have BPH and has been off of his Avodart for 9 months. No recent travel. No recent dental work or surgeries. No sick contacts. No neck pain or stiffness. No rash.   Hospital Course: Admitted with Fever,Chills, N/V, Rigors, Lactic Acidosis, ARF, Hypotension, Tachycardia, Sig Leukocytosis/left Shift c/w Sepsis. He grew out a [employer] E Coli in Urine and Blood. Meds were adjusted. Nephro consulted post US showing mild Hydro. Source of UTI - is presumed prostatitis. Reports no urinary stream issues.  Still intermittent Rigors but much better than admit. No SOB.  Eating OK.  Walking OK Seen on rounds today and Cr improved, Platelets improved some, and he is ready for D/C with Close follow up.  1 Ecoli Urosepsis - Pan Sensitive.  Finish 2-3 weeks of total Abx.  Cipro for 14 days written for. He is off Zosyn/Vanco. His URO is at [facility] Dr Edwyna Shell (last saw them late 2012) - He saw Dr Isabel Caprice here who will see him in follow up due to [facility] and nephrolith on [place]. Per Dr Noralee Chars Note: Impression/Assessment:  Urosepsis  presumptively secondary to acute prostatitis. It is unlikely that a nonobstructing left renal stone was an issue with his clinical presentation. Given the mild right hydronephrosis it is possible that the patient had a small right ureteral calculus that has indeed subsequently passed which may have been a contributing factor to urosepsis. The patient also has nonobstructing nephrolithiasis.  Plan:  At this point Mr. Savage has had substantial clinical improvement. He will need to be discharged with approximately 2 weeks of appropriate oral antibiotic therapy. He expresses interest in following up locally with our group. He should contact our office for a followup visit 2-4 weeks status post his discharge. We will need to obtain the old records from his outside urologist. He will need a followup urinalysis to make sure there is no ongoing prostatitis. We will need to perform a followup limited right renal ultrasound to make certain that the hydronephrosis has resolved. If it is not and his creatinine allows that he should have a CT scan with  IV contrast to further assess that hydronephrosis. We will need down the road to get repeat PSA data but this would be a poor time to do so given the recent prostatitis. He does not appear that he needs to be on an alpha-blocker given his current voiding status and good emptying. If his voiding situation worsens he would be a good candidate for Flomax. With regard to his left renal stone it does not appear to be causing any clinical problems. Would like to get old records to compare how large the stone was in the past and then most likely just observe this unless there has been substantial growth.  GRAPEY,DAVID S  2. Acute renal failure- S/p Aggressive hydration.  He was admitted c likely prerenal azotemia +/- ATN secondary to sepsis. Cr on D/C 1.43 3. UTI (urinary tract infection)- See above  4. Lactic acidosis- Resolved  5. BPH (benign prostatic hyperplasia)- I  restarted Avodart and no need for flomax 6. DVT prophylaxis- We D/Ced Lovenox with platelets dropping. SCDs were given.  7. Disposition- anticipate discharge to home today 8 Thrombocytopenia - monitor. At 66-due to sepsis? vrs HIT? No Bleeding. Lovenox stopped. No petechiae. Hold on Heme consult.  Today 79 and needs to follow as outpt.  9. Hperglycemia. Monitor.  10 hypokalemia - Repleted.      Day of Discharge Exam BP 138/80  Pulse 66  Temp(Src) 98.6 F (37 C) (Oral)  Resp 18  Ht 6' 0.84" (1.85 m)  Wt 94.6 kg (208 lb 8.9 oz)  BMI 27.64 kg/m2  SpO2 93%  Physical Exam: General appearance: Looks baseline  Eyes: no scleral icterus  Throat: oropharynx moist without erythema  Resp: CTA B  Cardio: Reg  GI: soft, non-tender; bowel sounds normal; no masses, no organomegaly  Extremities: no clubbing, cyanosis or edema  Tattoos noted.  Discharge Labs:  Recent Labs  03/31/13 0509 04/01/13 0502  NA 143 144  K 3.3* 4.0  CL 114* 113*  CO2 22 25  GLUCOSE 114* 139*  BUN 35* 27*  CREATININE 1.65* 1.43*  CALCIUM 8.3* 9.0   No results found for this basename: AST, ALT, ALKPHOS, BILITOT, PROT, ALBUMIN,  in the last 72 hours  Recent Labs  03/31/13 0509 04/01/13 0502  WBC 11.5* 13.2*  HGB 11.4* 12.3*  HCT 33.2* 35.0*  MCV 87.8 87.9  PLT 66* 79*   No results found for this basename: CKTOTAL, CKMB, CKMBINDEX, TROPONINI,  in the last 72 hours No results found for this basename: TSH, T4TOTAL, FREET3, T3FREE, THYROIDAB,  in the last 72 hours No results found for this basename: VITAMINB12, FOLATE, FERRITIN, TIBC, IRON, RETICCTPCT,  in the last 72 hours No results found for this basename: INR, PROTIME       Discharge instructions:  Final discharge disposition not confirmed     Follow-up Information   Follow up with Gwen Pounds, MD In 2 weeks.   Contact information:   [address] [facility] [place] [address] [phone]       Follow  up with [facility] S, MD In 2 weeks.   Contact information:   [address]                         [place] [address] [phone]        Disposition: home  Follow-up Appts: Follow-up with Dr. Timothy Lasso at [facility] in 1-2 weeks.  Call for appointment.  Condition on Discharge: stable  Tests Needing Follow-up: CBC, CMET and Renal US (per Dr Isabel Caprice)  Time spent in discharge (includes decision making & examination of pt): 35 min  Signed: Zoe Goonan M 04/01/2013, 10:50 AM

## 2013-04-04 LAB — CULTURE, BLOOD (ROUTINE X 2)
Culture: NO GROWTH
Culture: NO GROWTH

## 2013-04-06 ENCOUNTER — Other Ambulatory Visit: Payer: Self-pay | Admitting: Internal Medicine

## 2013-04-06 DIAGNOSIS — N179 Acute kidney failure, unspecified: Secondary | ICD-10-CM | POA: Diagnosis not present

## 2013-04-06 DIAGNOSIS — N1330 Unspecified hydronephrosis: Secondary | ICD-10-CM

## 2013-04-06 DIAGNOSIS — R82998 Other abnormal findings in urine: Secondary | ICD-10-CM | POA: Diagnosis not present

## 2013-04-09 ENCOUNTER — Ambulatory Visit
Admission: RE | Admit: 2013-04-09 | Discharge: 2013-04-09 | Disposition: A | Discharge status: Home / Self Care | Payer: Medicare Other | Source: Ambulatory Visit | Attending: Internal Medicine | Admitting: Internal Medicine

## 2013-04-09 DIAGNOSIS — N1330 Unspecified hydronephrosis: Secondary | ICD-10-CM | POA: Diagnosis not present

## 2013-04-09 DIAGNOSIS — N200 Calculus of kidney: Secondary | ICD-10-CM | POA: Diagnosis not present

## 2013-04-12 DIAGNOSIS — N200 Calculus of kidney: Secondary | ICD-10-CM | POA: Diagnosis not present

## 2013-04-12 DIAGNOSIS — N390 Urinary tract infection, site not specified: Secondary | ICD-10-CM | POA: Diagnosis not present

## 2013-04-12 DIAGNOSIS — N289 Disorder of kidney and ureter, unspecified: Secondary | ICD-10-CM | POA: Diagnosis not present

## 2013-04-12 DIAGNOSIS — N1330 Unspecified hydronephrosis: Secondary | ICD-10-CM | POA: Diagnosis not present

## 2013-04-30 DIAGNOSIS — N1330 Unspecified hydronephrosis: Secondary | ICD-10-CM | POA: Diagnosis not present

## 2013-04-30 DIAGNOSIS — N410 Acute prostatitis: Secondary | ICD-10-CM | POA: Diagnosis not present

## 2013-04-30 DIAGNOSIS — N390 Urinary tract infection, site not specified: Secondary | ICD-10-CM | POA: Diagnosis not present

## 2013-04-30 DIAGNOSIS — N179 Acute kidney failure, unspecified: Secondary | ICD-10-CM | POA: Diagnosis not present

## 2013-05-11 DIAGNOSIS — N1330 Unspecified hydronephrosis: Secondary | ICD-10-CM | POA: Diagnosis not present

## 2013-05-11 DIAGNOSIS — N401 Enlarged prostate with lower urinary tract symptoms: Secondary | ICD-10-CM | POA: Diagnosis not present

## 2013-05-11 DIAGNOSIS — N289 Disorder of kidney and ureter, unspecified: Secondary | ICD-10-CM | POA: Diagnosis not present

## 2013-05-11 DIAGNOSIS — N200 Calculus of kidney: Secondary | ICD-10-CM | POA: Diagnosis not present

## 2013-05-11 DIAGNOSIS — N390 Urinary tract infection, site not specified: Secondary | ICD-10-CM | POA: Diagnosis not present

## 2013-05-16 DIAGNOSIS — N201 Calculus of ureter: Secondary | ICD-10-CM | POA: Diagnosis not present

## 2013-05-16 DIAGNOSIS — N200 Calculus of kidney: Secondary | ICD-10-CM | POA: Diagnosis not present

## 2013-05-16 DIAGNOSIS — N1330 Unspecified hydronephrosis: Secondary | ICD-10-CM | POA: Diagnosis not present

## 2013-05-22 DIAGNOSIS — N201 Calculus of ureter: Secondary | ICD-10-CM | POA: Diagnosis not present

## 2013-05-22 DIAGNOSIS — N200 Calculus of kidney: Secondary | ICD-10-CM | POA: Diagnosis not present

## 2013-05-22 DIAGNOSIS — N1330 Unspecified hydronephrosis: Secondary | ICD-10-CM | POA: Diagnosis not present

## 2013-05-24 ENCOUNTER — Other Ambulatory Visit: Payer: Self-pay | Admitting: Urology

## 2013-05-28 ENCOUNTER — Encounter (HOSPITAL_COMMUNITY): Payer: Self-pay | Admitting: Pharmacy Technician

## 2013-06-01 ENCOUNTER — Encounter (HOSPITAL_COMMUNITY): Payer: Self-pay | Admitting: *Deleted

## 2013-06-01 NOTE — Progress Notes (Signed)
Insrtucted to stop Aspirin, Advil and supplements,. Laxative as directed day prior and drink plenty of fluids. NPO past midnight. May take antibiotic and medication for reflux am of procedure. Tylenol if needed for pain. Read every page in blue folder and bring folder with him day of procedure.

## 2013-06-01 NOTE — Progress Notes (Signed)
Called pt and left message to call us back regarding is lithotripsy procedure scheduled Monday June 9th.

## 2013-06-01 NOTE — H&P (Signed)
Reason For Visit  Eddie Walker returns to discuss his recent CT scan. At the time of his last visit he continued to have evidence of at least moderate right hydronephrosis of uncertain etiology. CT scan was performed which showed a 12 x 6 mm stone in his proximal right ureter. He presents now to discuss treatment options for this. He is also here for a KUB to check on visualization to better determine the best course of action. HU of stone about 900 units. Also was noted to have a 2-3 mm stone in proximal left ureter. Now on SS septra to prevent recurrent infection.   History of Present Illness  History from last visit:    Eddie Walker presents today for followup visit and repeat renal ultrasound. His complicated history is summarized in the hospital consultation note as well as he has discharge summary. The patient was seen in early April 2014 with urosepsis and right hydronephrosis. We felt that he has urosepsis was secondary to acute prostatitis. The patient did have a nonobstructing left renal calculus. He did have some nonspecific mild right hydronephrosis but no evidence of a ureteral calculus on that side. As a baseline he had very little in the way of significant voiding complaints despite a history of BPH as well as a history of elevated PSA. He had previously been under the care of outside urologist. His creatinine level is 2.4 at the time of admission but it improved to 1.65 at discharge. The patient had a repeat creatinine here last month in our office which was down to 1.3. The plan was to get a CT with IV contrast to look at the mild hydronephrosis rule out any pathology. The patient did not want a CT that point so the alternative plan was to repeat his ultrasound today and then decide if additional imaging is necessary. In addition discussion need to be obtained with regard to the left renal calculus. The stone in the lower pole left kidney was felt to be 17 mm on CT imaging. Patient is currently on Avodart.    Past Medical History Problems  1. History of  Esophageal Reflux 530.81 2. History of  Gout 274.9 3. History of  Hernia 553.9 4. History of  Nephrolithiasis V13.01  Surgical History Problems  1. History of  Knee Arthroscopy Bilateral 2. History of  Surgery Testis Right  Current Meds 1. Aspirin 81 MG Oral Tablet; Therapy: (Recorded:17Apr2014) to 2. Finasteride 5 MG Oral Tablet; Take 1 tablet daily; Therapy: 16May2014 to (Evaluate:11May2015)   Requested for: 19May2014; Last Rx:16May2014 3. Glucosamine-Chondroitin Oral Capsule; Therapy: (Recorded:17Apr2014) to 4. Multi-Vitamin Oral Tablet; Therapy: (Recorded:17Apr2014) to 5. Omeprazole Magnesium 20.6 (20 Base) MG Oral Capsule Delayed Release; Therapy:  (Recorded:17Apr2014) to 6. Sulfamethoxazole-Trimethoprim 400-80 MG Oral Tablet; Take 1 tablet every day; Therapy:  22May2014 to (Last Rx:22May2014)  Requested for: 22May2014  Allergies Medication  1. Lidocaine HCl SOLN 2. Procaine HCl SOLN Non-Medication  3. Peanuts  Family History Problems  1. Fraternal history of  Benign Prostatic Hypertrophy 2. Family history of  Death In The Family Father age 104 of Parkinson's 3. Maternal history of  Diverticulitis Of Colon 4. Family history of  Family Health Status Number Of Children 1 adopted son   2 adopted daughters  and 1 stepson 5. Paternal history of  Hiatal Hernia 6. Paternal history of  Parkinson's Disease  Social History Problems  1. Alcohol Use 2 glasses wine a week 2. Marital History - Currently Married 3. Occupation: Retired 4. History of  Tobacco Use 305.1 smoked 1 ppd x 43 years   Quit  3+ years ago Denied  5. History of  Caffeine Use  Review of Systems Genitourinary, constitutional, skin, eye, otolaryngeal, hematologic/lymphatic, cardiovascular, pulmonary, endocrine, musculoskeletal, gastrointestinal, neurological and psychiatric system(s) were reviewed and pertinent findings if present are noted.   Genitourinary: nocturia (0-1x), but no dysuria and no hematuria.  Gastrointestinal: no flank pain and no abdominal pain.    Vitals Vital Signs [Data Includes: Last 1 Day]  27May2014 02:53PM  Blood Pressure: 133 / 86 Temperature: 98.3 F Heart Rate: 81  Physical Exam Constitutional: Well nourished and well developed . No acute distress.  ENT:. The ears and nose are normal in appearance.  Pulmonary: No respiratory distress and normal respiratory rhythm and effort.  Cardiovascular: Heart rate and rhythm are normal . No peripheral edema.  Abdomen: The abdomen is soft and nontender. No masses are palpated. No CVA tenderness. No hernias are palpable. No hepatosplenomegaly noted.  Skin: Normal skin turgor, no visible rash and no visible skin lesions.  Neuro/Psych:. Mood and affect are appropriate.    Results/Data Urine [Data Includes: Last 1 Day]   27May2014  COLOR YELLOW   APPEARANCE CLEAR   SPECIFIC GRAVITY 1.025   pH 6.0   GLUCOSE NEG mg/dL  BILIRUBIN NEG   KETONE NEG mg/dL  BLOOD MOD   PROTEIN NEG mg/dL  UROBILINOGEN 0.2 mg/dL  NITRITE NEG   LEUKOCYTE ESTERASE SMALL   SQUAMOUS EPITHELIAL/HPF NONE SEEN   WBC 7-10 WBC/hpf  RBC 3-6 RBC/hpf  BACTERIA FEW   CRYSTALS NONE SEEN   CASTS NONE SEEN   Selected Results  CT-ABD/PELVIS W/W/O CONTRAST 21May2014 12:00AM Barron Alvine   Test Name Result Flag Reference  ** RADIOLOGY REPORT BY [facility] **   *RADIOLOGY REPORT*  Clinical Data: Right hydronephrosis on ultrasound  CT ABDOMEN AND PELVIS WITHOUT AND WITH CONTRAST  Technique: Multidetector CT imaging of the abdomen and pelvis was performed without contrast material in one or both body regions, followed by contrast material(s) and further sections in one or both body regions.  Contrast: 125 ml Isovue 300 IV  Comparison: Abdominal ultrasound dated 04/09/2013  Findings: Mild linear scarring at the lung bases.  2.3 x 2.4 cm cyst along the right hepatic  dome (series 3/image 14).  Spleen and pancreas are within normal limits. Mild thickening of the bilateral adrenal glands.  Gallbladder is unremarkable. No intrahepatic or extrahepatic ductal dilatation.  Mild to moderate right hydronephrosis. Associated 12 x 6 mm proximal right ureteral calculus (series 2/image 32).  14 x 11 mm nonobstructing left lower pole renal calculus (series 2/image 32). Least three additional nonobstructing left renal calculi measuring to 3 mm. No hydronephrosis.  No evidence of bowel obstruction. Normal appendix.  Atherosclerotic calcifications of the abdominal aorta and branch vessels.  No abdominopelvic ascites.  No suspicious abdominopelvic lymphadenopathy.  Prostate is unremarkable.  3 mm mid left ureteral calculus (series 716/image 82). Suspected punctate layering bladder calculus (series 2/image 71). Bladder is mildly thick-walled.  On delayed imaging, there are no filling defects in the bilateral opacified proximal collecting systems, ureters, and bladder. The mid/distal right ureter is suboptimally opacified.  Degenerative changes of the visualized thoracolumbar spine.  IMPRESSION: 12 mm proximal right ureteral calculus with mild to moderate right hydronephrosis.  3 mm mid left ureteral calculus without hydronephrosis. Additional nonobstructing left renal calculi measuring up to 14 mm in the left lower pole.  Suspected punctate bladder calculus.  Mildly thick-walled bladder, correlate  for cystitis.  Additional ancillary findings as above.   Original Report Authenticated By: Charline Bills, M.D.   URINE CULTURE 16May2014 02:19PM Barron Alvine  SOURCE : CLEAN CATCH SPECIMEN TYPE: CLEAN CATCH   Test Name Result Flag Reference  CULTURE, URINE Culture, Urine    ===== COLONY COUNT: =====  NO GROWTH   FINAL REPORT: NO GROWTH   Assessment Assessed  1. Nephrolithiasis Of The Left Kidney 592.0 2. Hydronephrosis On The Right  591  Plan Health Maintenance (V70.0)  1. UA With REFLEX  Done: 27May2014 01:45PM  Discussion/Summary   Mr. Hammitt has had a complicated several months. He came in urosepsis and was known to have a nonobstructing stone in the lower pole of his left kidney. His clinical situation seemed to be consistent with acute prostatitis, but certainly in retrospect, it is possible that his right ureteral stone was ultimately the cause of his urosepsis and was undiagnosed at the time of his admission. Clinically, he has done markedly better but had persistent right hydronephrosis. For that reason, we performed a CT with contrast, which did reveal at least partially to moderately obstructing right proximal ureteral stone. The patient underwent KUB imaging today and the stone does show up relatively well. It appears to be about 8-9 mm in size. It has a Hounsfield unit of about 900 and is moderate in density. That stone definitely needs to be treated. We talked about options, including ESWL, ESWL with stent or potentially ureteroscopy. My recommendation would be to pursue with ESWL with about a 70% chance of success and certainly go to plan B with stenting and/or ureteroscopy if ESWL fails or he develops significant problems with obstructing fragments. He was diagnosed with a tiny stone in his left proximal ureter, which would have an excellent chance of passing spontaneously. I do not think that stone needs any treatment. I could not see it definitely on KUB today, but we will have to monitor his situation carefully. Again, his renal function has normalized and certainly this stone is not completely obstructing. We will attempt to get him set up for some time in the next 1-3 weeks.    Amendment  cc Creola Corn, MD   Signatures Electronically signed by : Barron Alvine, M.D.; May 23 2013 11:52AM

## 2013-06-04 ENCOUNTER — Encounter (HOSPITAL_COMMUNITY): Payer: Self-pay | Admitting: *Deleted

## 2013-06-04 ENCOUNTER — Ambulatory Visit (HOSPITAL_COMMUNITY): Payer: Medicare Other

## 2013-06-04 ENCOUNTER — Ambulatory Visit (HOSPITAL_COMMUNITY)
Admission: RE | Admit: 2013-06-04 | Discharge: 2013-06-04 | Disposition: A | Discharge status: Home / Self Care | Payer: Medicare Other | Source: Ambulatory Visit | Attending: Urology | Admitting: Urology

## 2013-06-04 ENCOUNTER — Encounter (HOSPITAL_COMMUNITY)
Admission: RE | Disposition: A | Discharge status: Home / Self Care | Payer: Self-pay | Source: Ambulatory Visit | Attending: Urology

## 2013-06-04 DIAGNOSIS — Z8639 Personal history of other endocrine, nutritional and metabolic disease: Secondary | ICD-10-CM | POA: Insufficient documentation

## 2013-06-04 DIAGNOSIS — Z79899 Other long term (current) drug therapy: Secondary | ICD-10-CM | POA: Insufficient documentation

## 2013-06-04 DIAGNOSIS — N200 Calculus of kidney: Secondary | ICD-10-CM | POA: Diagnosis not present

## 2013-06-04 DIAGNOSIS — Z87440 Personal history of urinary (tract) infections: Secondary | ICD-10-CM | POA: Diagnosis not present

## 2013-06-04 DIAGNOSIS — Z7982 Long term (current) use of aspirin: Secondary | ICD-10-CM | POA: Insufficient documentation

## 2013-06-04 DIAGNOSIS — N201 Calculus of ureter: Secondary | ICD-10-CM | POA: Diagnosis not present

## 2013-06-04 DIAGNOSIS — K219 Gastro-esophageal reflux disease without esophagitis: Secondary | ICD-10-CM | POA: Diagnosis not present

## 2013-06-04 DIAGNOSIS — Z87442 Personal history of urinary calculi: Secondary | ICD-10-CM | POA: Diagnosis not present

## 2013-06-04 DIAGNOSIS — Z862 Personal history of diseases of the blood and blood-forming organs and certain disorders involving the immune mechanism: Secondary | ICD-10-CM | POA: Insufficient documentation

## 2013-06-04 HISTORY — DX: Personal history of other diseases of the digestive system: Z87.19

## 2013-06-04 HISTORY — DX: Gastro-esophageal reflux disease without esophagitis: K21.9

## 2013-06-04 SURGERY — LITHOTRIPSY, ESWL
Anesthesia: LOCAL | Laterality: Right

## 2013-06-04 MED ORDER — DIPHENHYDRAMINE HCL 25 MG PO CAPS
25.0000 mg | ORAL_CAPSULE | ORAL | Status: AC
  Administered 2013-06-04: 25 mg via ORAL
  Filled 2013-06-04: qty 1

## 2013-06-04 MED ORDER — DIAZEPAM 5 MG PO TABS
10.0000 mg | ORAL_TABLET | ORAL | Status: AC
  Administered 2013-06-04: 10 mg via ORAL
  Filled 2013-06-04: qty 2

## 2013-06-04 MED ORDER — HYDROCODONE-ACETAMINOPHEN 5-325 MG PO TABS: 1.0000 | ORAL_TABLET | Freq: Four times a day (QID) | ORAL | Status: DC | PRN

## 2013-06-04 MED ORDER — DEXTROSE-NACL 5-0.45 % IV SOLN
INTRAVENOUS | Status: DC
  Administered 2013-06-04: 11:00:00 via INTRAVENOUS

## 2013-06-04 MED ORDER — DEXTROSE 5 % IV SOLN
2.0000 g | Freq: Once | INTRAVENOUS | Status: AC
  Administered 2013-06-04: 2 g via INTRAVENOUS
  Filled 2013-06-04: qty 2

## 2013-06-04 NOTE — Op Note (Signed)
See [facility] OP note scanned into chart. 

## 2013-06-26 DIAGNOSIS — N201 Calculus of ureter: Secondary | ICD-10-CM | POA: Diagnosis not present

## 2013-06-26 DIAGNOSIS — N200 Calculus of kidney: Secondary | ICD-10-CM | POA: Diagnosis not present

## 2013-06-26 DIAGNOSIS — N1330 Unspecified hydronephrosis: Secondary | ICD-10-CM | POA: Diagnosis not present

## 2013-08-30 DIAGNOSIS — M109 Gout, unspecified: Secondary | ICD-10-CM | POA: Diagnosis not present

## 2013-08-30 DIAGNOSIS — E785 Hyperlipidemia, unspecified: Secondary | ICD-10-CM | POA: Diagnosis not present

## 2013-08-30 DIAGNOSIS — Z125 Encounter for screening for malignant neoplasm of prostate: Secondary | ICD-10-CM | POA: Diagnosis not present

## 2013-08-30 DIAGNOSIS — R7309 Other abnormal glucose: Secondary | ICD-10-CM | POA: Diagnosis not present

## 2013-09-07 DIAGNOSIS — Z1212 Encounter for screening for malignant neoplasm of rectum: Secondary | ICD-10-CM | POA: Diagnosis not present

## 2013-09-10 DIAGNOSIS — N390 Urinary tract infection, site not specified: Secondary | ICD-10-CM | POA: Diagnosis not present

## 2013-09-10 DIAGNOSIS — K439 Ventral hernia without obstruction or gangrene: Secondary | ICD-10-CM | POA: Diagnosis not present

## 2013-09-10 DIAGNOSIS — M109 Gout, unspecified: Secondary | ICD-10-CM | POA: Diagnosis not present

## 2013-09-10 DIAGNOSIS — Z9109 Other allergy status, other than to drugs and biological substances: Secondary | ICD-10-CM | POA: Diagnosis not present

## 2013-09-10 DIAGNOSIS — Z0000 Encounter for general adult medical examination without abnormal findings: Secondary | ICD-10-CM | POA: Diagnosis not present

## 2013-09-10 DIAGNOSIS — K7689 Other specified diseases of liver: Secondary | ICD-10-CM | POA: Diagnosis not present

## 2013-09-10 DIAGNOSIS — R7309 Other abnormal glucose: Secondary | ICD-10-CM | POA: Diagnosis not present

## 2013-09-10 DIAGNOSIS — E669 Obesity, unspecified: Secondary | ICD-10-CM | POA: Diagnosis not present

## 2013-09-10 DIAGNOSIS — Z1331 Encounter for screening for depression: Secondary | ICD-10-CM | POA: Diagnosis not present

## 2013-10-04 DIAGNOSIS — N289 Disorder of kidney and ureter, unspecified: Secondary | ICD-10-CM | POA: Diagnosis not present

## 2013-10-04 DIAGNOSIS — N200 Calculus of kidney: Secondary | ICD-10-CM | POA: Diagnosis not present

## 2014-02-06 IMAGING — CR DG ABDOMEN 1V
1 series · 1 of 1 positions shown · non-contrast
Comparison: 05/22/2013

CLINICAL DATA: Lithotripsy.  Preop radiograph.

ABDOMEN - 1 VIEW

[t abdomen supine]
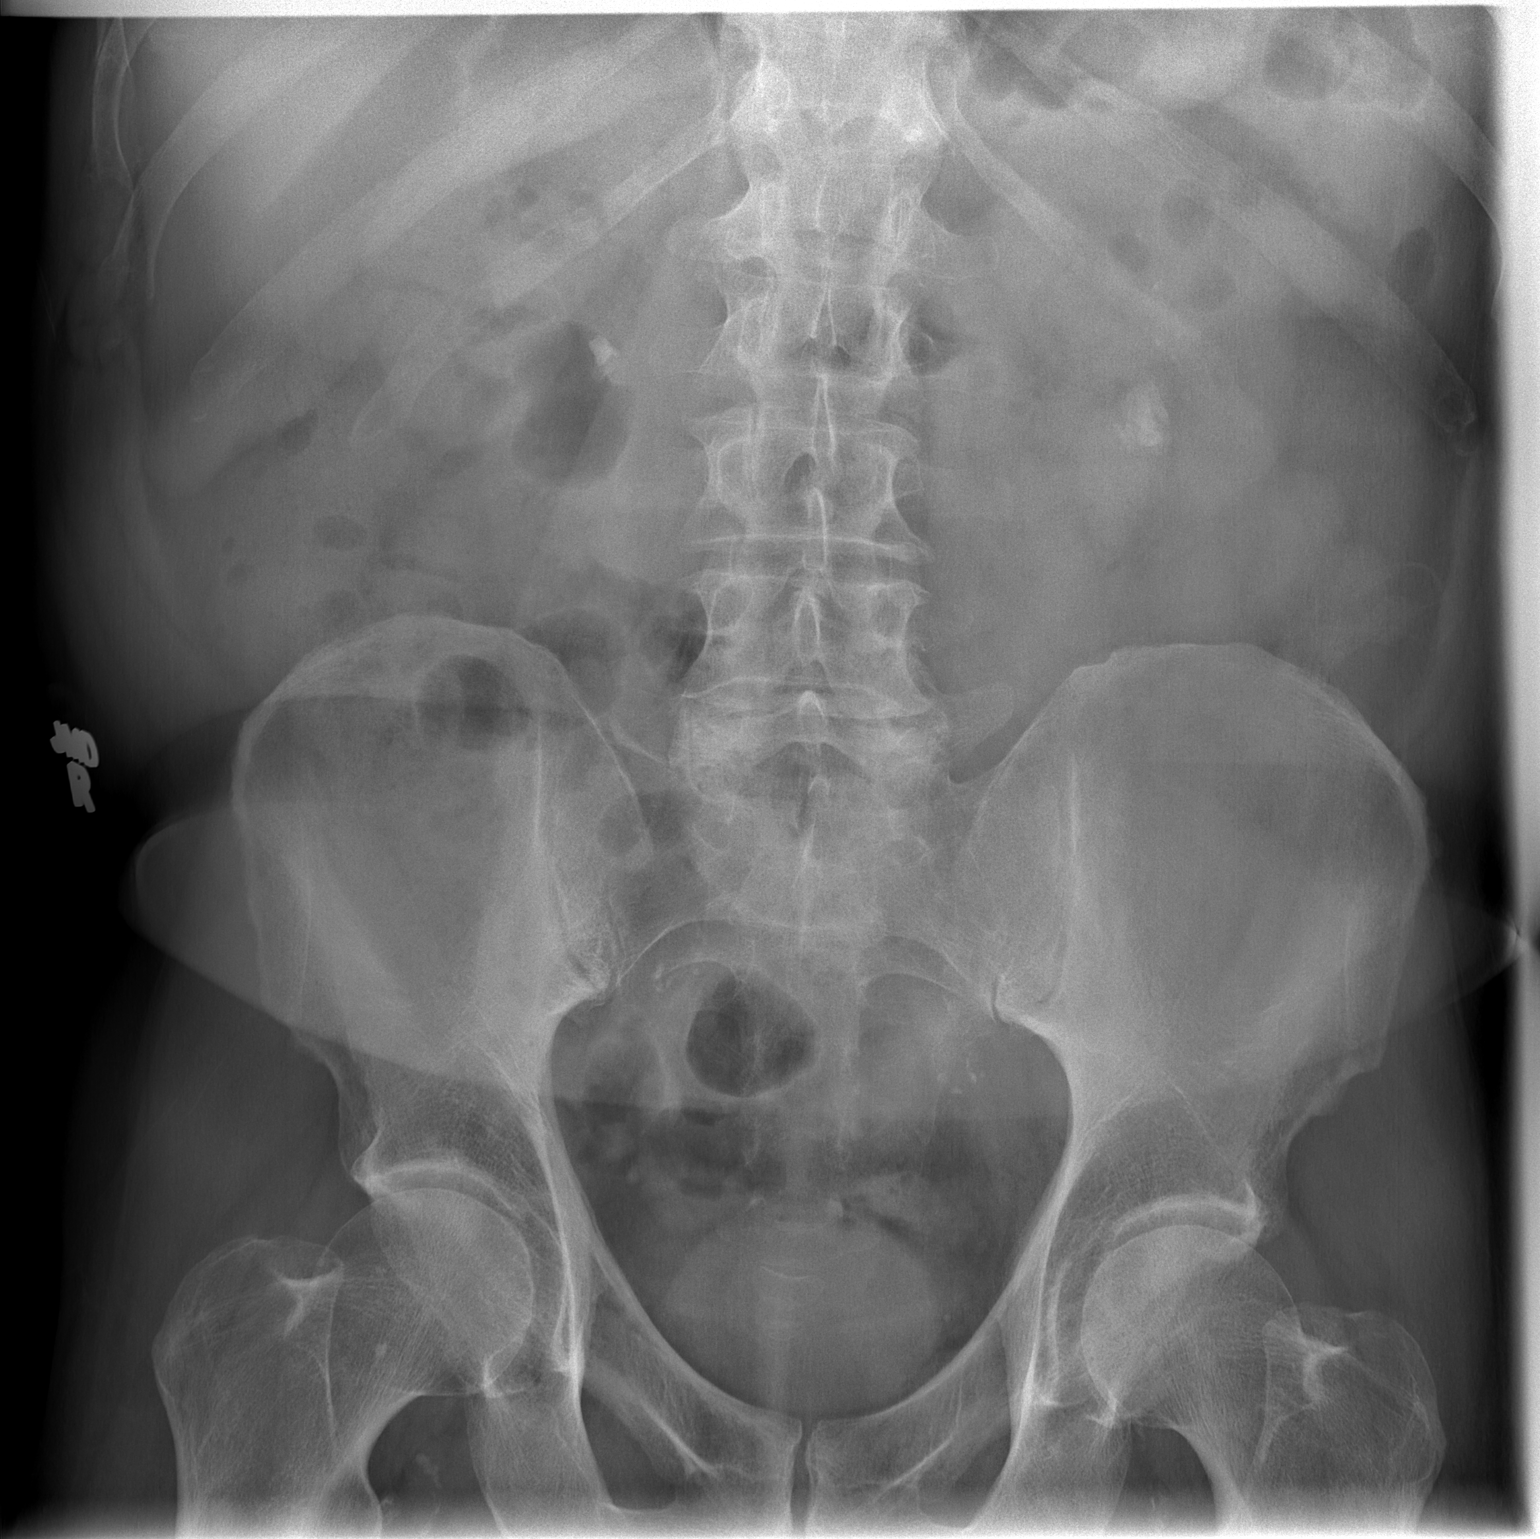

[1 of 1 positions shown; findings below may reference images not displayed]

FINDINGS: There is a large stone within the inferior pole left
kidney measuring 1.7 cm. Several smaller punctate stones are noted
within the upper and mid pole of the left kidney.  At the right UPJ
there is a stone measuring 8 mm. Bowel gas pattern is
nonobstructed.  No dilated loop of small bowel or fluid levels
identified.
IMPRESSION: 1.  Bilateral renal calculi appear  similar in position to the CT
from 05/16/2013.

## 2014-02-27 DIAGNOSIS — H43819 Vitreous degeneration, unspecified eye: Secondary | ICD-10-CM | POA: Diagnosis not present

## 2014-02-27 DIAGNOSIS — H40019 Open angle with borderline findings, low risk, unspecified eye: Secondary | ICD-10-CM | POA: Diagnosis not present

## 2014-02-27 DIAGNOSIS — H53009 Unspecified amblyopia, unspecified eye: Secondary | ICD-10-CM | POA: Diagnosis not present

## 2014-02-27 DIAGNOSIS — H2510 Age-related nuclear cataract, unspecified eye: Secondary | ICD-10-CM | POA: Diagnosis not present

## 2014-04-04 DIAGNOSIS — N138 Other obstructive and reflux uropathy: Secondary | ICD-10-CM | POA: Diagnosis not present

## 2014-04-04 DIAGNOSIS — N529 Male erectile dysfunction, unspecified: Secondary | ICD-10-CM | POA: Diagnosis not present

## 2014-04-04 DIAGNOSIS — N200 Calculus of kidney: Secondary | ICD-10-CM | POA: Diagnosis not present

## 2014-04-04 DIAGNOSIS — N401 Enlarged prostate with lower urinary tract symptoms: Secondary | ICD-10-CM | POA: Diagnosis not present

## 2014-04-04 DIAGNOSIS — N139 Obstructive and reflux uropathy, unspecified: Secondary | ICD-10-CM | POA: Diagnosis not present

## 2014-04-11 DIAGNOSIS — H40019 Open angle with borderline findings, low risk, unspecified eye: Secondary | ICD-10-CM | POA: Diagnosis not present

## 2014-04-11 DIAGNOSIS — H2510 Age-related nuclear cataract, unspecified eye: Secondary | ICD-10-CM | POA: Diagnosis not present

## 2014-04-15 ENCOUNTER — Emergency Department (HOSPITAL_BASED_OUTPATIENT_CLINIC_OR_DEPARTMENT_OTHER): Payer: Medicare Other

## 2014-04-15 ENCOUNTER — Encounter (HOSPITAL_BASED_OUTPATIENT_CLINIC_OR_DEPARTMENT_OTHER): Payer: Self-pay | Admitting: Emergency Medicine

## 2014-04-15 ENCOUNTER — Inpatient Hospital Stay (HOSPITAL_BASED_OUTPATIENT_CLINIC_OR_DEPARTMENT_OTHER)
Admission: EM | Admit: 2014-04-15 | Discharge: 2014-04-18 | DRG: 694 | Disposition: A | Discharge status: Home / Self Care | Payer: Medicare Other | Attending: Urology | Admitting: Urology

## 2014-04-15 DIAGNOSIS — N179 Acute kidney failure, unspecified: Secondary | ICD-10-CM | POA: Diagnosis not present

## 2014-04-15 DIAGNOSIS — R509 Fever, unspecified: Secondary | ICD-10-CM | POA: Diagnosis not present

## 2014-04-15 DIAGNOSIS — N323 Diverticulum of bladder: Secondary | ICD-10-CM | POA: Diagnosis present

## 2014-04-15 DIAGNOSIS — Z87442 Personal history of urinary calculi: Secondary | ICD-10-CM

## 2014-04-15 DIAGNOSIS — N390 Urinary tract infection, site not specified: Secondary | ICD-10-CM | POA: Diagnosis present

## 2014-04-15 DIAGNOSIS — N139 Obstructive and reflux uropathy, unspecified: Secondary | ICD-10-CM | POA: Diagnosis not present

## 2014-04-15 DIAGNOSIS — N201 Calculus of ureter: Principal | ICD-10-CM | POA: Diagnosis present

## 2014-04-15 DIAGNOSIS — Z91010 Allergy to peanuts: Secondary | ICD-10-CM | POA: Diagnosis not present

## 2014-04-15 DIAGNOSIS — A419 Sepsis, unspecified organism: Secondary | ICD-10-CM | POA: Diagnosis not present

## 2014-04-15 DIAGNOSIS — N200 Calculus of kidney: Secondary | ICD-10-CM | POA: Diagnosis present

## 2014-04-15 DIAGNOSIS — Z79899 Other long term (current) drug therapy: Secondary | ICD-10-CM | POA: Diagnosis not present

## 2014-04-15 DIAGNOSIS — N138 Other obstructive and reflux uropathy: Secondary | ICD-10-CM

## 2014-04-15 DIAGNOSIS — N1330 Unspecified hydronephrosis: Secondary | ICD-10-CM | POA: Diagnosis present

## 2014-04-15 DIAGNOSIS — K219 Gastro-esophageal reflux disease without esophagitis: Secondary | ICD-10-CM | POA: Diagnosis present

## 2014-04-15 LAB — CBC WITH DIFFERENTIAL/PLATELET
BASOS ABS: 0 10*3/uL (ref 0.0–0.1)
BASOS PCT: 0 % (ref 0–1)
EOS ABS: 0 10*3/uL (ref 0.0–0.7)
EOS PCT: 0 % (ref 0–5)
HEMATOCRIT: 44 % (ref 39.0–52.0)
Hemoglobin: 14.9 g/dL (ref 13.0–17.0)
LYMPHS PCT: 3 % — AB (ref 12–46)
Lymphs Abs: 0.5 10*3/uL — ABNORMAL LOW (ref 0.7–4.0)
MCH: 31.3 pg (ref 26.0–34.0)
MCHC: 33.9 g/dL (ref 30.0–36.0)
MCV: 92.4 fL (ref 78.0–100.0)
MONO ABS: 0.9 10*3/uL (ref 0.1–1.0)
Monocytes Relative: 6 % (ref 3–12)
Neutro Abs: 15 10*3/uL — ABNORMAL HIGH (ref 1.7–7.7)
Neutrophils Relative %: 91 % — ABNORMAL HIGH (ref 43–77)
PLATELETS: 209 10*3/uL (ref 150–400)
RBC: 4.76 MIL/uL (ref 4.22–5.81)
RDW: 14.3 % (ref 11.5–15.5)
WBC: 16.4 10*3/uL — AB (ref 4.0–10.5)

## 2014-04-15 LAB — COMPREHENSIVE METABOLIC PANEL
ALBUMIN: 3.9 g/dL (ref 3.5–5.2)
ALT: 26 U/L (ref 0–53)
AST: 27 U/L (ref 0–37)
Alkaline Phosphatase: 85 U/L (ref 39–117)
BUN: 29 mg/dL — ABNORMAL HIGH (ref 6–23)
CALCIUM: 9.6 mg/dL (ref 8.4–10.5)
CO2: 24 meq/L (ref 19–32)
CREATININE: 1.6 mg/dL — AB (ref 0.50–1.35)
Chloride: 102 mEq/L (ref 96–112)
GFR calc Af Amer: 50 mL/min — ABNORMAL LOW (ref >90)
GFR, EST NON AFRICAN AMERICAN: 43 mL/min — AB (ref >90)
Glucose, Bld: 173 mg/dL — ABNORMAL HIGH (ref 70–99)
Potassium: 3.7 mEq/L (ref 3.7–5.3)
SODIUM: 140 meq/L (ref 137–147)
TOTAL PROTEIN: 8 g/dL (ref 6.0–8.3)
Total Bilirubin: 1 mg/dL (ref 0.3–1.2)

## 2014-04-15 LAB — URINALYSIS, ROUTINE W REFLEX MICROSCOPIC
Bilirubin Urine: NEGATIVE
Glucose, UA: NEGATIVE mg/dL
KETONES UR: 15 mg/dL — AB
Nitrite: POSITIVE — AB
PROTEIN: 30 mg/dL — AB
Specific Gravity, Urine: 1.026 (ref 1.005–1.030)
UROBILINOGEN UA: 0.2 mg/dL (ref 0.0–1.0)
pH: 6 (ref 5.0–8.0)

## 2014-04-15 LAB — URINE MICROSCOPIC-ADD ON

## 2014-04-15 LAB — I-STAT CG4 LACTIC ACID, ED: Lactic Acid, Venous: 1.19 mmol/L (ref 0.5–2.2)

## 2014-04-15 MED ORDER — IBUPROFEN 800 MG PO TABS
ORAL_TABLET | ORAL | Status: AC
  Filled 2014-04-15: qty 1

## 2014-04-15 MED ORDER — ACETAMINOPHEN 325 MG PO TABS: 650.0000 mg | ORAL_TABLET | Freq: Once | ORAL | Status: DC

## 2014-04-15 MED ORDER — SODIUM CHLORIDE 0.9 % IV BOLUS (SEPSIS)
1000.0000 mL | Freq: Once | INTRAVENOUS | Status: AC
  Administered 2014-04-15: 1000 mL via INTRAVENOUS

## 2014-04-15 MED ORDER — IOHEXOL 300 MG/ML  SOLN
50.0000 mL | Freq: Once | INTRAMUSCULAR | Status: AC | PRN
  Administered 2014-04-15: 50 mL via ORAL

## 2014-04-15 MED ORDER — CEFTRIAXONE SODIUM 1 G IJ SOLR
INTRAMUSCULAR | Status: AC
  Filled 2014-04-15: qty 10

## 2014-04-15 MED ORDER — IOHEXOL 300 MG/ML  SOLN
100.0000 mL | Freq: Once | INTRAMUSCULAR | Status: AC | PRN
  Administered 2014-04-15: 80 mL via INTRAVENOUS

## 2014-04-15 MED ORDER — IBUPROFEN 800 MG PO TABS
800.0000 mg | ORAL_TABLET | Freq: Once | ORAL | Status: AC
  Administered 2014-04-15: 800 mg via ORAL

## 2014-04-15 MED ORDER — DEXTROSE 5 % IV SOLN
1.0000 g | Freq: Once | INTRAVENOUS | Status: AC
  Administered 2014-04-15: 1 g via INTRAVENOUS

## 2014-04-15 NOTE — ED Notes (Signed)
Pt c/o upper left abd pain n/v x 2 days

## 2014-04-15 NOTE — ED Notes (Signed)
LAC drawn results of 1.19 hand delivered to Dr. Dina Rich.

## 2014-04-15 NOTE — ED Notes (Signed)
Patient transported to CT 

## 2014-04-15 NOTE — ED Provider Notes (Signed)
CSN: [id number]     Arrival date & time 04/15/14  2108 History   This chart was scribed for Eddie Hacker, MD by Celesta Gentile, ED Scribe. The patient was seen in room MH02/MH02. Patient's care was started at 9:41 PM.    Chief Complaint  Patient presents with  . Abdominal Pain   The history is provided by the patient. No language interpreter was used.   HPI Comments: Eddie Walker is a 68 y.o. male who presents to the Emergency Department complaining of intermittent left flank pain that started about 2 days ago.  Pt describes his pain as cramping and nonradiating.  He rates his pain as a 2 out 10, but states he has a high tolerance for pain.  Pt reports he hasn't had a bowel movement in about 2 days.  He states he took a stool softener last night and was able to pass some stool.  He denies any visible blood in his stool or vomit.  He has associated nausea and vomiting.  Pt reports he thought had food poisoning, because on Saturday he ate chicken fried steak and the next morning he had his first emesis episode.  Pt denies chest pain and SOB.  Pt has associated worsening subjective fever.  Pt states his fever today started around 99.17F, but increased to 101.73F.  ED temperature is currently 101.87F.  He states he tried Tylenol without relief.  Pt currently has an abdominal hernia.  Pt denies any abdominal surgeries.  Pt's wife states about 1 year ago he had a prostate infection that spread to his blood.     Past Medical History  Diagnosis Date  . Hernia 01/2013  . Kidney stone 2008  . GERD (gastroesophageal reflux disease)   . H/O hiatal hernia    Past Surgical History  Procedure Laterality Date  . Orchiectomy Right   . Knee arthroscopy Left   . Hernia repair      left  . Teeth pulled  2010   History reviewed. No pertinent family history. History  Substance Use Topics  . Smoking status: Former Smoker -- 1.50 packs/day    Types: Cigarettes, Pipe, Cigars    Quit date: 11/28/2009  .  Smokeless tobacco: Not on file  . Alcohol Use: 0.6 oz/week    1 Glasses of wine per week    Review of Systems  Constitutional: Positive for fever.  Respiratory: Negative.  Negative for chest tightness and shortness of breath.   Cardiovascular: Negative.  Negative for chest pain.  Gastrointestinal: Positive for nausea, vomiting and abdominal pain. Negative for diarrhea.  Genitourinary: Positive for flank pain. Negative for dysuria and hematuria.  Musculoskeletal: Negative for back pain.  Skin: Negative for rash.  Neurological: Negative for headaches.  All other systems reviewed and are negative.     Allergies  Peanut-containing drug products and Novocain  Home Medications   Prior to Admission medications   Medication Sig Start Date End Date Taking? Authorizing Provider  dutasteride (AVODART) 0.5 MG capsule Take 0.5 mg by mouth daily.    Historical Provider, MD  glucosamine-chondroitin 500-400 MG tablet Take 1 tablet by mouth 3 (three) times daily.    Historical Provider, MD  ibuprofen (ADVIL,MOTRIN) 200 MG tablet Take 200 mg by mouth every 6 (six) hours as needed for pain.    Historical Provider, MD  Multiple Vitamin (MULTIVITAMIN WITH MINERALS) TABS Take 1 tablet by mouth daily.    Historical Provider, MD  omeprazole (PRILOSEC) 20 MG capsule Take 20  mg by mouth daily.    Historical Provider, MD   Triage Vitals: BP 127/76  Pulse 126  Temp(Src) 101.2 F (38.4 C) (Oral)  Resp 24  Ht 6\' 1"  (1.854 m)  Wt 200 lb (90.719 kg)  BMI 26.39 kg/m2  SpO2 92%  Physical Exam  Nursing note and vitals reviewed. Constitutional: He is oriented to person, place, and time. He appears well-developed and well-nourished.  Uncomfortable appearing  HENT:  Head: Normocephalic and atraumatic.  Mouth/Throat: Oropharynx is clear and moist.  Eyes: Pupils are equal, round, and reactive to light.  Neck: Neck supple.  Cardiovascular: Regular rhythm and normal heart sounds.   No murmur  heard. Tachycardia  Pulmonary/Chest: Effort normal and breath sounds normal. No respiratory distress. He has no wheezes.  Abdominal: Soft. Bowel sounds are normal. There is no tenderness. There is no rebound.  Ventral midline hernia, reducible  Musculoskeletal: He exhibits no edema.  Lymphadenopathy:    He has no cervical adenopathy.  Neurological: He is alert and oriented to person, place, and time.  Skin: Skin is warm and dry.  Psychiatric: He has a normal mood and affect.    ED Course  Procedures (including critical care time)  CRITICAL CARE Performed by: Eddie Walker   Total critical care time: 40 min  Critical care time was exclusive of separately billable procedures and treating other patients.  Critical care was necessary to treat or prevent imminent or life-threatening deterioration.  Critical care was time spent personally by me on the following activities: development of treatment plan with patient and/or surrogate as well as nursing, discussions with consultants, evaluation of patient's response to treatment, examination of patient, obtaining history from patient or surrogate, ordering and performing treatments and interventions, ordering and review of laboratory studies, ordering and review of radiographic studies, pulse oximetry and re-evaluation of patient's condition.  DIAGNOSTIC STUDIES: Oxygen Saturation is 92% on RA, low by my interpretation.    COORDINATION OF CARE: 9:58 PM-Will order CT abdomen pelvis, UA, CBC, and comprehensive metabolic panel.  Patient informed of current plan of treatment and evaluation and agrees with plan.    Results for orders placed during the hospital encounter of 04/15/14  URINALYSIS, ROUTINE W REFLEX MICROSCOPIC      Result Value Ref Range   Color, Urine YELLOW  YELLOW   APPearance CLEAR  CLEAR   Specific Gravity, Urine 1.026  1.005 - 1.030   pH 6.0  5.0 - 8.0   Glucose, UA NEGATIVE  NEGATIVE mg/dL   Hgb urine dipstick  MODERATE (*) NEGATIVE   Bilirubin Urine NEGATIVE  NEGATIVE   Ketones, ur 15 (*) NEGATIVE mg/dL   Protein, ur 30 (*) NEGATIVE mg/dL   Urobilinogen, UA 0.2  0.0 - 1.0 mg/dL   Nitrite POSITIVE (*) NEGATIVE   Leukocytes, UA MODERATE (*) NEGATIVE  CBC WITH DIFFERENTIAL      Result Value Ref Range   WBC 16.4 (*) 4.0 - 10.5 K/uL   RBC 4.76  4.22 - 5.81 MIL/uL   Hemoglobin 14.9  13.0 - 17.0 g/dL   HCT 44.0  39.0 - 52.0 %   MCV 92.4  78.0 - 100.0 fL   MCH 31.3  26.0 - 34.0 pg   MCHC 33.9  30.0 - 36.0 g/dL   RDW 14.3  11.5 - 15.5 %   Platelets 209  150 - 400 K/uL   Neutrophils Relative % 91 (*) 43 - 77 %   Neutro Abs 15.0 (*) 1.7 - 7.7 K/uL  Lymphocytes Relative 3 (*) 12 - 46 %   Lymphs Abs 0.5 (*) 0.7 - 4.0 K/uL   Monocytes Relative 6  3 - 12 %   Monocytes Absolute 0.9  0.1 - 1.0 K/uL   Eosinophils Relative 0  0 - 5 %   Eosinophils Absolute 0.0  0.0 - 0.7 K/uL   Basophils Relative 0  0 - 1 %   Basophils Absolute 0.0  0.0 - 0.1 K/uL  COMPREHENSIVE METABOLIC PANEL      Result Value Ref Range   Sodium 140  137 - 147 mEq/L   Potassium 3.7  3.7 - 5.3 mEq/L   Chloride 102  96 - 112 mEq/L   CO2 24  19 - 32 mEq/L   Glucose, Bld 173 (*) 70 - 99 mg/dL   BUN 29 (*) 6 - 23 mg/dL   Creatinine, Ser 1.60 (*) 0.50 - 1.35 mg/dL   Calcium 9.6  8.4 - 10.5 mg/dL   Total Protein 8.0  6.0 - 8.3 g/dL   Albumin 3.9  3.5 - 5.2 g/dL   AST 27  0 - 37 U/L   ALT 26  0 - 53 U/L   Alkaline Phosphatase 85  39 - 117 U/L   Total Bilirubin 1.0  0.3 - 1.2 mg/dL   GFR calc non Af Amer 43 (*) >90 mL/min   GFR calc Af Amer 50 (*) >90 mL/min  URINE MICROSCOPIC-ADD ON      Result Value Ref Range   Squamous Epithelial / LPF FEW (*) RARE   WBC, UA 11-20  <3 WBC/hpf   RBC / HPF 7-10  <3 RBC/hpf   Bacteria, UA FEW (*) RARE  I-STAT CG4 LACTIC ACID, ED      Result Value Ref Range   Lactic Acid, Venous 1.19  0.5 - 2.2 mmol/L    Imaging Review Ct Abdomen Pelvis W Contrast  04/15/2014   CLINICAL DATA:  Left  abdominal pain  EXAM: CT ABDOMEN AND PELVIS WITH CONTRAST  TECHNIQUE: Multidetector CT imaging of the abdomen and pelvis was performed using the standard protocol following bolus administration of intravenous contrast.  CONTRAST:  68mL OMNIPAQUE IOHEXOL 300 MG/ML SOLN, 47mL OMNIPAQUE IOHEXOL 300 MG/ML SOLN  COMPARISON:  DG ABDOMEN 1V dated 04/04/2014; CT ABD-PEL WO/W CM dated 05/16/2013  FINDINGS: A 1.6 cm left pelvic calculus is associated with left hydronephrosis and slight delayed nephrogram phase in the left kidney. There is some left perinephric stranding. Multiple small calculi are also present in the left renal collecting system. Tiny calculi in the lower pole of the right renal collecting system. No other ureteral calculi are present.  Stable liver cysts. Stable hypodensity in the posterior right kidney and hypodensity in the lower pole of the left kidney.  Gallbladder, spleen, pancreas are within normal limits.  Stable left adrenal nodules.  Stability supports benign etiology.  Prostate gland is mildly prominent.  No free-fluid.  No obvious retroperitoneal adenopathy.  No vertebral compression deformity.  Left inguinal hernia contains adipose tissue.  IMPRESSION: Left renal pelvic calculus associated with secondary findings of left ureteral obstruction.  Bilateral nephrolithiasis.  Chronic changes as described above.   Electronically Signed   By: Maryclare Bean M.D.   On: 04/15/2014 23:14     MDM   Final diagnoses:  Sepsis due to urinary tract infection  Urinary tract obstruction by kidney stone   Patient presents with left flank pain, fever. History of urosepsis. He is febrile on exam the 1 a  1.2, tachycardic to 126. Blood pressure within normal limits. Patient has a history of obstructing nephrolithiasis. Basic labwork was obtained including blood cultures. Lactate is normal. Leukocytosis to 16.4 with nitroglycerin positive urine. Given history of obstructive pathology, will obtain CT scan. CT scan  shows perinephric stranding on the left with hydronephrosis and obstruction associated with a 1.6 cm calculus.  Patient was given Rocephin based on prior culture data. Vital signs normalized with fever control. Patient was also given fluids. Discussed with Dr. Jeffie Pollock who has accepted the patient for admission. Patient will be n.p.o. in anticipation for stent placement.  I personally performed the services described in this documentation, which was scribed in my presence. The recorded information has been reviewed and is accurate.      Eddie Hacker, MD 04/16/14 Dyann Kief

## 2014-04-15 NOTE — ED Notes (Signed)
MD at bedside. 

## 2014-04-16 ENCOUNTER — Encounter (HOSPITAL_COMMUNITY): Payer: Medicare Other | Admitting: Anesthesiology

## 2014-04-16 ENCOUNTER — Encounter (HOSPITAL_COMMUNITY)
Admission: EM | Disposition: A | Discharge status: Home / Self Care | Payer: Self-pay | Source: Home / Self Care | Attending: Urology

## 2014-04-16 ENCOUNTER — Observation Stay (HOSPITAL_COMMUNITY): Payer: Medicare Other | Admitting: Anesthesiology

## 2014-04-16 ENCOUNTER — Encounter (HOSPITAL_COMMUNITY): Payer: Self-pay | Admitting: Urology

## 2014-04-16 DIAGNOSIS — N390 Urinary tract infection, site not specified: Secondary | ICD-10-CM | POA: Diagnosis not present

## 2014-04-16 DIAGNOSIS — N139 Obstructive and reflux uropathy, unspecified: Secondary | ICD-10-CM | POA: Diagnosis not present

## 2014-04-16 DIAGNOSIS — N200 Calculus of kidney: Secondary | ICD-10-CM | POA: Diagnosis not present

## 2014-04-16 DIAGNOSIS — R509 Fever, unspecified: Secondary | ICD-10-CM | POA: Diagnosis not present

## 2014-04-16 DIAGNOSIS — N1330 Unspecified hydronephrosis: Secondary | ICD-10-CM | POA: Diagnosis not present

## 2014-04-16 DIAGNOSIS — N179 Acute kidney failure, unspecified: Secondary | ICD-10-CM | POA: Diagnosis not present

## 2014-04-16 DIAGNOSIS — N201 Calculus of ureter: Secondary | ICD-10-CM | POA: Diagnosis not present

## 2014-04-16 HISTORY — PX: CYSTOSCOPY WITH STENT PLACEMENT: SHX5790

## 2014-04-16 LAB — SURGICAL PCR SCREEN
MRSA, PCR: NEGATIVE
STAPHYLOCOCCUS AUREUS: POSITIVE — AB

## 2014-04-16 SURGERY — CYSTOSCOPY, WITH STENT INSERTION
Anesthesia: General | Site: Ureter | Laterality: Left

## 2014-04-16 SURGERY — CYSTOURETEROSCOPY, WITH RETROGRADE PYELOGRAM AND STENT INSERTION
Anesthesia: General | Laterality: Left

## 2014-04-16 MED ORDER — DEXTROSE 5 % IV SOLN: 500.0000 mg | Freq: Two times a day (BID) | INTRAVENOUS | Status: DC

## 2014-04-16 MED ORDER — MIDAZOLAM HCL 2 MG/2ML IJ SOLN
INTRAMUSCULAR | Status: AC
  Filled 2014-04-16: qty 2

## 2014-04-16 MED ORDER — HYDROMORPHONE HCL PF 2 MG/ML IJ SOLN: 1.5000 mg | INTRAMUSCULAR | Status: DC | PRN

## 2014-04-16 MED ORDER — FENTANYL CITRATE 0.05 MG/ML IJ SOLN
INTRAMUSCULAR | Status: AC
  Filled 2014-04-16: qty 2

## 2014-04-16 MED ORDER — HYDROMORPHONE HCL PF 2 MG/ML IJ SOLN: 2.0000 mg | INTRAMUSCULAR | Status: DC | PRN

## 2014-04-16 MED ORDER — PHENAZOPYRIDINE HCL 200 MG PO TABS
200.0000 mg | ORAL_TABLET | Freq: Three times a day (TID) | ORAL | Status: DC | PRN
  Filled 2014-04-16: qty 1

## 2014-04-16 MED ORDER — SUCCINYLCHOLINE CHLORIDE 20 MG/ML IJ SOLN
INTRAMUSCULAR | Status: DC | PRN
  Administered 2014-04-16: 100 mg via INTRAVENOUS

## 2014-04-16 MED ORDER — FENTANYL CITRATE 0.05 MG/ML IJ SOLN
INTRAMUSCULAR | Status: DC | PRN
Start: 2014-04-16 — End: 2014-04-16
  Administered 2014-04-16 (×2): 50 ug via INTRAVENOUS

## 2014-04-16 MED ORDER — STERILE WATER FOR IRRIGATION IR SOLN
Status: DC | PRN
  Administered 2014-04-16: 3000 mL

## 2014-04-16 MED ORDER — 0.9 % SODIUM CHLORIDE (POUR BTL) OPTIME
TOPICAL | Status: DC | PRN
  Administered 2014-04-16: 1000 mL

## 2014-04-16 MED ORDER — PHENYLEPHRINE HCL 10 MG/ML IJ SOLN
INTRAMUSCULAR | Status: DC | PRN
  Administered 2014-04-16: 80 ug via INTRAVENOUS
  Administered 2014-04-16 (×2): 40 ug via INTRAVENOUS

## 2014-04-16 MED ORDER — PHENYLEPHRINE 40 MCG/ML (10ML) SYRINGE FOR IV PUSH (FOR BLOOD PRESSURE SUPPORT)
PREFILLED_SYRINGE | INTRAVENOUS | Status: AC
  Filled 2014-04-16: qty 10

## 2014-04-16 MED ORDER — ONDANSETRON HCL 4 MG/2ML IJ SOLN: 4.0000 mg | Freq: Four times a day (QID) | INTRAMUSCULAR | Status: DC | PRN

## 2014-04-16 MED ORDER — PROPOFOL 10 MG/ML IV BOLUS
INTRAVENOUS | Status: AC
  Filled 2014-04-16: qty 20

## 2014-04-16 MED ORDER — ONDANSETRON HCL 4 MG/2ML IJ SOLN
INTRAMUSCULAR | Status: AC
  Filled 2014-04-16: qty 2

## 2014-04-16 MED ORDER — PROPOFOL 10 MG/ML IV BOLUS
INTRAVENOUS | Status: DC | PRN
  Administered 2014-04-16: 150 mg via INTRAVENOUS
  Administered 2014-04-16: 50 mg via INTRAVENOUS

## 2014-04-16 MED ORDER — DUTASTERIDE 0.5 MG PO CAPS
0.5000 mg | ORAL_CAPSULE | Freq: Every day | ORAL | Status: DC
  Administered 2014-04-16 – 2014-04-18 (×3): 0.5 mg via ORAL
  Filled 2014-04-16 (×3): qty 1

## 2014-04-16 MED ORDER — DEXTROSE-NACL 5-0.45 % IV SOLN: INTRAVENOUS | Status: DC

## 2014-04-16 MED ORDER — SUCCINYLCHOLINE CHLORIDE 20 MG/ML IJ SOLN
INTRAMUSCULAR | Status: AC
  Filled 2014-04-16: qty 1

## 2014-04-16 MED ORDER — PANTOPRAZOLE SODIUM 40 MG PO TBEC
40.0000 mg | DELAYED_RELEASE_TABLET | Freq: Every day | ORAL | Status: DC
  Administered 2014-04-16 – 2014-04-18 (×3): 40 mg via ORAL
  Filled 2014-04-16 (×3): qty 1

## 2014-04-16 MED ORDER — DOCUSATE SODIUM 100 MG PO CAPS
100.0000 mg | ORAL_CAPSULE | Freq: Two times a day (BID) | ORAL | Status: DC
  Administered 2014-04-16 (×2): 100 mg via ORAL
  Filled 2014-04-16 (×6): qty 1

## 2014-04-16 MED ORDER — ONDANSETRON HCL 4 MG/2ML IJ SOLN
INTRAMUSCULAR | Status: DC | PRN
  Administered 2014-04-16: 4 mg via INTRAVENOUS

## 2014-04-16 MED ORDER — LACTATED RINGERS IV SOLN
INTRAVENOUS | Status: DC
  Administered 2014-04-16 – 2014-04-17 (×4): via INTRAVENOUS

## 2014-04-16 MED ORDER — MIDAZOLAM HCL 5 MG/5ML IJ SOLN
INTRAMUSCULAR | Status: DC | PRN
  Administered 2014-04-16: 2 mg via INTRAVENOUS

## 2014-04-16 MED ORDER — ZOLPIDEM TARTRATE 5 MG PO TABS: 5.0000 mg | ORAL_TABLET | Freq: Every evening | ORAL | Status: DC | PRN

## 2014-04-16 MED ORDER — HYOSCYAMINE SULFATE 0.125 MG SL SUBL
0.1250 mg | SUBLINGUAL_TABLET | SUBLINGUAL | Status: DC | PRN
  Filled 2014-04-16: qty 1

## 2014-04-16 MED ORDER — ACETAMINOPHEN 325 MG PO TABS
650.0000 mg | ORAL_TABLET | ORAL | Status: DC | PRN
  Administered 2014-04-16 – 2014-04-18 (×5): 650 mg via ORAL
  Filled 2014-04-16 (×5): qty 2

## 2014-04-16 MED ORDER — FENTANYL CITRATE 0.05 MG/ML IJ SOLN: 25.0000 ug | INTRAMUSCULAR | Status: DC | PRN

## 2014-04-16 MED ORDER — POTASSIUM CHLORIDE 2 MEQ/ML IV SOLN
INTRAVENOUS | Status: DC
  Administered 2014-04-16: 04:00:00 via INTRAVENOUS
  Filled 2014-04-16 (×2): qty 1000

## 2014-04-16 MED ORDER — LACTATED RINGERS IV SOLN
INTRAVENOUS | Status: DC | PRN
  Administered 2014-04-16: 05:00:00 via INTRAVENOUS

## 2014-04-16 MED ORDER — DEXTROSE 5 % IV SOLN
1.0000 g | INTRAVENOUS | Status: DC
  Filled 2014-04-16: qty 10

## 2014-04-16 MED ORDER — DEXTROSE 5 % IV SOLN
1.0000 g | INTRAVENOUS | Status: DC
  Administered 2014-04-16 – 2014-04-17 (×2): 1 g via INTRAVENOUS
  Filled 2014-04-16 (×2): qty 10

## 2014-04-16 SURGICAL SUPPLY — 5 items
CATH URET 5FR 28IN OPEN ENDED (CATHETERS) ×2 IMPLANT
DRAPE CAMERA CLOSED 9X96 (DRAPES) ×2 IMPLANT
GUIDEWIRE STR DUAL SENSOR (WIRE) ×2 IMPLANT
PACK CYSTO (CUSTOM PROCEDURE TRAY) ×2 IMPLANT
STENT POLARIS 6FR 26CM .038 (STENTS) ×2 IMPLANT

## 2014-04-16 NOTE — H&P (Signed)
Subjective: [name] is a 68 yo male with a history of stones who presented to [facility] with a fever to 101.3 and left flank pain.  His pain began Sunday morning about 1 am.  The pain was not severe but caused nausea with vomiting.  He had no hematuria or voiding complaints.   He had a low grade fever Sunday but increased on 101.1 by Monday.  His UA looks infected and a CT shows a 1.6cm left UPJ stone with obstruction.  He had just seen Dr. Grapey with a KUB on 4/9 and the stone was present but asymptomatic.  He has had prior prostatitis in 4/14 and that was when the stones were identified.  He has had no other GU history.  His Cr is 1.6 and his WBC is 16.4   ROS:  Review of Systems  Constitutional: Positive for fever and chills. Negative for weight loss and malaise/fatigue.  HENT: Negative for congestion.   Eyes: Negative.   Respiratory: Negative.   Cardiovascular: Negative.   Gastrointestinal: Positive for nausea, vomiting and diarrhea. Negative for constipation.  Genitourinary: Positive for flank pain. Negative for dysuria and hematuria.  Musculoskeletal: Positive for joint pain (hands). Negative for back pain and myalgias.  Skin: Negative.   Neurological: Positive for headaches.  Endo/Heme/Allergies: Negative.   Psychiatric/Behavioral: Negative.     Allergies  Allergen Reactions  . Peanut-Containing Drug Products Anaphylaxis    REACTION: Patient has Epipen to treat this- anaphylaxis shock  . Novocain [Procaine Hcl] Itching and Rash    Past Medical History  Diagnosis Date  . Hernia 01/2013  . Kidney stone 2008  . GERD (gastroesophageal reflux disease)   . H/O hiatal hernia   Prostatitis.  Past Surgical History  Procedure Laterality Date  . Orchiectomy Right   . Knee arthroscopy Left   . Hernia repair      left  . Teeth pulled  2010  Lithotripsy 2014.  History   Social History  . Marital Status: Married    Spouse Name: N/A    Number of  Children: N/A  . Years of Education: N/A   Occupational History  . Not on file.   Social History Main Topics  . Smoking status: Former Smoker -- 1.50 packs/day    Types: Cigarettes, Pipe, Cigars    Quit date: 11/28/2009  . Smokeless tobacco: Not on file  . Alcohol Use: 0.6 oz/week    1 Glasses of wine per week  . Drug Use: No  . Sexual Activity: No   Other Topics Concern  . Not on file   Social History Narrative  . No narrative on file    History reviewed. No pertinent family history.  Anti-infectives: Anti-infectives   Start     Dose/Rate Route Frequency Ordered Stop   04/15/14 2230  cefTRIAXone (ROCEPHIN) 1 g in dextrose 5 % 50 mL IVPB     1 g 100 mL/hr over 30 Minutes Intravenous  Once 04/15/14 2224 04/15/14 2305   04/15/14 2227  cefTRIAXone (ROCEPHIN) 1 G injection    Comments:  Marcum, Ruth   : cabinet override      04 /20/15 2227 04/15/14 2231      Current Facility-Administered Medications  Medication Dose Route Frequency Provider Last Rate Last Dose  . dextrose 5 % and 0.45% NaCl 1,000 mL with potassium chloride 20 mEq infusion   Intravenous Continuous Irine Seal, MD      . HYDROmorphone (DILAUDID) injection 1.5-2 mg  1.5-2 mg Intravenous Q2H PRN Irine Seal, MD      . ondansetron [facility]) injection 4 mg  4 mg Intravenous Q6H PRN Irine Seal, MD         Objective: Vital signs in last 24 hours: Temp:  [98.1 F (36.7 C)-101.2 F (38.4 C)] 98.1 F (36.7 C) (04/21 0252) Pulse Rate:  [92-126] 105 (04/21 0252) Resp:  [16-24] 18 (04/21 0252) BP: (114-130)/(76-89) 127/85 mmHg (04/21 0252) SpO2:  [92 %-94 %] 94 % (04/21 0252) Weight:  [90.719 kg (200 lb)-93.26 kg (205 lb 9.6 oz)] 93.26 kg (205 lb 9.6 oz) (04/21 0252)  Intake/Output from previous day:   Intake/Output this shift:     Physical Exam  Constitutional: He is oriented to person, place, and time and well-developed, well-nourished, and in no distress. No distress.  HENT:  Head: Normocephalic and  atraumatic.  Neck: Normal range of motion. Neck supple. No thyromegaly present.  Cardiovascular: Regular rhythm and normal heart sounds.   No murmur heard. tachycardia  Pulmonary/Chest: No respiratory distress. He has no wheezes. He has no rales.  Abdominal: He exhibits no distension and no mass. There is tenderness (left flank mild). There is no rebound and no guarding.  Musculoskeletal: Normal range of motion. He exhibits no edema and no tenderness.  Lymphadenopathy:    He has no cervical adenopathy.  Neurological: He is alert and oriented to person, place, and time.  Skin: Skin is warm and dry. No rash noted. No erythema.  Psychiatric: Mood and affect normal.    Lab Results:   Recent Labs  04/15/14 2152  WBC 16.4*  HGB 14.9  HCT 44.0  PLT 209   BMET  Recent Labs  04/15/14 2152  NA 140  K 3.7  CL 102  CO2 24  GLUCOSE 173*  BUN 29*  CREATININE 1.60*  CALCIUM 9.6   PT/INR No results found for this basename: LABPROT, INR,  in the last 72 hours ABG No results found for this basename: PHART, PCO2, PO2, HCO3,  in the last 72 hours  Studies/Results: Ct Abdomen Pelvis W Contrast  04/15/2014   CLINICAL DATA:  Left abdominal pain  EXAM: CT ABDOMEN AND PELVIS WITH CONTRAST  TECHNIQUE: Multidetector CT imaging of the abdomen and pelvis was performed using the standard protocol following bolus administration of intravenous contrast.  CONTRAST:  65mL OMNIPAQUE IOHEXOL 300 MG/ML SOLN, 52mL OMNIPAQUE IOHEXOL 300 MG/ML SOLN  COMPARISON:  DG ABDOMEN 1V dated 04/04/2014; CT ABD-PEL WO/W CM dated 05/16/2013  FINDINGS: A 1.6 cm left pelvic calculus is associated with left hydronephrosis and slight delayed nephrogram phase in the left kidney. There is some left perinephric stranding. Multiple small calculi are also present in the left renal collecting system. Tiny calculi in the lower pole of the right renal collecting system. No other ureteral calculi are present.  Stable liver cysts. Stable  hypodensity in the posterior right kidney and hypodensity in the lower pole of the left kidney.  Gallbladder, spleen, pancreas are within normal limits.  Stable left adrenal nodules.  Stability supports benign etiology.  Prostate gland is mildly prominent.  No free-fluid.  No obvious retroperitoneal adenopathy.  No vertebral compression deformity.  Left inguinal hernia contains adipose tissue.  IMPRESSION: Left renal pelvic calculus associated with secondary findings of left ureteral obstruction.  Bilateral nephrolithiasis.  Chronic changes as described above.   Electronically Signed   By: Maryclare Bean M.D.   On: 04/15/2014 23:14   I have reviewed his labs and CT  films and report and discussed his case with the ER physician.  A culture was obtained of the urine prior to initiation of rocephin.   Assessment: Left UPJ stone with febrile UTI. ARI  Plan: I discussed the options including PCNL and cystoscopy with stenting.    He is on a daily ASA so I will proceed with stenting this AM.   I reviewed the risks of bleeding, infection, ureteral injury, need for secondary procedures, thrombotic events and anesthetic complications.  CC: Dr. Rana Snare.     LOS: 1 day    Irine Seal 04/16/2014

## 2014-04-16 NOTE — Progress Notes (Signed)
Day of Surgery Subjective: Patient reports feeling substantially better. Nausea has resolved and pain is markedly improved. No voiding issues. He is now afebrile. No evidence of urosepsis.  Objective: Vital signs in last 24 hours: Temp:  [97.6 F (36.4 C)-101.2 F (38.4 C)] 97.6 F (36.4 C) (04/21 0645) Pulse Rate:  [92-126] 92 (04/21 0645) Resp:  [14-24] 14 (04/21 0645) BP: (100-130)/(74-89) 111/74 mmHg (04/21 0645) SpO2:  [92 %-100 %] 95 % (04/21 0645) Weight:  [200 lb (90.719 kg)-205 lb 9.6 oz (93.26 kg)] 205 lb 9.6 oz (93.26 kg) (04/21 0252)  Intake/Output from previous day: 04/20 0701 - 04/21 0700 In: 600 [I.V.:600] Out: -  Intake/Output this shift:    Physical Exam:  Constitutional: Vital signs reviewed. WD WN in NAD   Eyes: PERRL, No scleral icterus.   Cardiovascular: RRR Pulmonary/Chest: Normal effort Abdominal: Soft. Non-tender, non-distended, bowel sounds are normal, no masses, organomegaly, or guarding present.  Extremities: No cyanosis or edema   Lab Results:  Recent Labs  04/15/14 2152  HGB 14.9  HCT 44.0   BMET  Recent Labs  04/15/14 2152  NA 140  K 3.7  CL 102  CO2 24  GLUCOSE 173*  BUN 29*  CREATININE 1.60*  CALCIUM 9.6   No results found for this basename: LABPT, INR,  in the last 72 hours No results found for this basename: LABURIN,  in the last 72 hours Results for orders placed during the hospital encounter of 04/15/14  SURGICAL PCR SCREEN     Status: Abnormal   Collection Time    04/16/14  4:27 AM      Result Value Ref Range Status   MRSA, PCR NEGATIVE  NEGATIVE Final   Staphylococcus aureus POSITIVE (*) NEGATIVE Final   Comment:            The Xpert SA Assay (FDA     approved for NASAL specimens     in patients over 34 years of age),     is one component of     a comprehensive surveillance     program.  Test performance has     been validated by [employer] for patients greater     than or equal to 50 year old.     It  is not intended     to diagnose infection nor to     guide or monitor treatment.    Studies/Results: Ct Abdomen Pelvis W Contrast  04/15/2014   CLINICAL DATA:  Left abdominal pain  EXAM: CT ABDOMEN AND PELVIS WITH CONTRAST  TECHNIQUE: Multidetector CT imaging of the abdomen and pelvis was performed using the standard protocol following bolus administration of intravenous contrast.  CONTRAST:  46mL OMNIPAQUE IOHEXOL 300 MG/ML SOLN, 106mL OMNIPAQUE IOHEXOL 300 MG/ML SOLN  COMPARISON:  DG ABDOMEN 1V dated 04/04/2014; CT ABD-PEL WO/W CM dated 05/16/2013  FINDINGS: A 1.6 cm left pelvic calculus is associated with left hydronephrosis and slight delayed nephrogram phase in the left kidney. There is some left perinephric stranding. Multiple small calculi are also present in the left renal collecting system. Tiny calculi in the lower pole of the right renal collecting system. No other ureteral calculi are present.  Stable liver cysts. Stable hypodensity in the posterior right kidney and hypodensity in the lower pole of the left kidney.  Gallbladder, spleen, pancreas are within normal limits.  Stable left adrenal nodules.  Stability supports benign etiology.  Prostate gland is mildly prominent.  No free-fluid.  No obvious retroperitoneal adenopathy.  No vertebral compression deformity.  Left inguinal hernia contains adipose tissue.  IMPRESSION: Left renal pelvic calculus associated with secondary findings of left ureteral obstruction.  Bilateral nephrolithiasis.  Chronic changes as described above.   Electronically Signed   By: Maryclare Bean M.D.   On: 04/15/2014 23:14    Assessment/Plan:   Large left UPJ stone with febrile UTI. Clinically improved. We'll continue IV Rocephin. We'll check culture results and continue to monitor her over the next 24 hours. Probable discharge tomorrow if continues to do well clinically. Patient will require definitive stone treatment in 10-14 days.   LOS: 1 day   Bernestine Amass 04/16/2014, 8:11 AM

## 2014-04-16 NOTE — Brief Op Note (Signed)
04/15/2014 - 04/16/2014  5:50 AM  PATIENT:  Eddie Walker  69 y.o. male  PRE-OPERATIVE DIAGNOSIS:  Left UPJ stone with fever and obstruction.   POST-OPERATIVE DIAGNOSIS:  Same  PROCEDURE:  Procedure(s): CYSTOSCOPY WITH STENT PLACEMENT (Left)  SURGEON:  Surgeon(s) and Role:    * Irine Seal, MD - Primary  PHYSICIAN ASSISTANT:   ASSISTANTS: none   ANESTHESIA:   general  EBL:     BLOOD ADMINISTERED:none  DRAINS: 6 x 26 left JJ stent   LOCAL MEDICATIONS USED:  NONE  SPECIMEN:  No Specimen  DISPOSITION OF SPECIMEN:  N/A  COUNTS:  YES  TOURNIQUET:  * No tourniquets in log *  DICTATION: .Other Dictation: Dictation Number [id number]  PLAN OF CARE: Admit for overnight observation  PATIENT DISPOSITION:  PACU - hemodynamically stable.   Delay start of Pharmacological VTE agent (>24hrs) due to surgical blood loss or risk of bleeding: not applicable

## 2014-04-16 NOTE — Transfer of Care (Signed)
Immediate Anesthesia Transfer of Care Note  Patient: Eddie Walker  Procedure(s) Performed: Procedure(s) (LRB): CYSTOSCOPY WITH STENT PLACEMENT (Left)  Patient Location: PACU  Anesthesia Type: General  Level of Consciousness: sedated, patient cooperative and responds to stimulation  Airway & Oxygen Therapy: Patient Spontanous Breathing and Patient connected to face mask oxgen  Post-op Assessment: Report given to PACU RN and Post -op Vital signs reviewed and stable  Post vital signs: Reviewed and stable  Complications: No apparent anesthesia complications

## 2014-04-16 NOTE — Progress Notes (Signed)
UR completed 

## 2014-04-16 NOTE — Progress Notes (Signed)
CRITICAL VALUE ALERT  Critical value received: + blood culture aerobic/anaerobic gram + cocci in clusters  Date of notification:  04/16/14  Time of notification:  2025  Critical value read back:yes  Nurse who received alert:  Hansel Feinstein RN  MD notified (1st page):  Dr Risa Grill  Time of first page:  2025  MD notified (2nd page):  Time of second page:  Responding MD:  Jimmey Ralph NP  Time MD responded:  2030

## 2014-04-16 NOTE — Op Note (Signed)
Eddie Walker, Eddie Walker NO.:  [id number]  MEDICAL RECORD NO.:  [id number]  LOCATION:  6144                         FACILITY:  [facility]  PHYSICIAN:  Marshall Cork. Jeffie Pollock, M.D.    DATE OF BIRTH:  October 11, 1946  DATE OF PROCEDURE:  04/16/2014 DATE OF DISCHARGE:                              OPERATIVE REPORT   PROCEDURE:  Cystoscopy with insertion of left double-J stent.  PREOPERATIVE DIAGNOSES:  A 1.6 cm left ureteropelvic junction stone with obstruction and fever.  POSTOPERATIVE DIAGNOSES:  A 1.6 cm left ureteropelvic junction stone with obstruction and fever.  SURGEON:  Marshall Cork. Jeffie Pollock, M.D.  ANESTHESIA:  General.  SPECIMEN:  None.  DRAINS:  A 6-French x 26 cm left double-J stent.  BLOOD LOSS:  Minimal.  COMPLICATIONS:  None.  INDICATIONS:  Mr. Greenblatt is a 68 year old white male with a history of stones who underwent lithotripsy by Dr. Risa Grill last year.  He had the onset over the weekend with left flank pain followed by fever and was seen in the [facility], where a CT scan demonstrated a 1.6 cm stone lodged at the UPJ with hydronephrosis.  He also had mild-to- moderate renal insufficiency with a creatinine of 1.6 and elevated white count and a fever to 101.3, was felt that stenting was indicated.  He received Rocephin at [facility].  FINDINGS OF PROCEDURE:  He was taken to the operating room where general anesthetic was induced.  He was placed in lithotomy position, was fitted with PAS hose.  His perineum and genitalia were prepped with Betadine solution.  He was draped in usual sterile fashion.  Cystoscopy was performed using a 22-French scope and 12-degree lens. Examination revealed a normal urethra.  The external sphincter was intact.  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  The middle lobe was small. Examination of the bladder revealed moderate trabeculation with  some cellules and small diverticula on the dome.  There were no tumor, stones or inflammation were noted.  The ureteral orifices were unremarkable.  The left ureteral orifice was cannulated with 5-French opening catheter and a guidewire was advanced through the catheter to the kidney by the stone.  Once the wire was in position, there was a brisk efflux of bloody turbid urine alongside the wire.  At this point, a 6-French 26 cm double-J stent without string was passed to the kidney over the wire under fluoroscopic guidance.  The wire was removed leaving good coil in the kidney and a good coil in the bladder.  At this point, the patient's bladder was drained.  Cystoscope was removed.  He was taken down from lithotomy position.  His anesthetic was reversed.  He was moved to the recovery in stable condition.  There were no complications.     Marshall Cork. Jeffie Pollock, M.D.     JJW/MEDQ  D:  04/16/2014  T:  04/16/2014  Job:  [id number]

## 2014-04-16 NOTE — Anesthesia Preprocedure Evaluation (Addendum)
Anesthesia Evaluation  Patient identified by MRN, date of birth, ID band Patient awake    Reviewed: Allergy & Precautions, H&P , NPO status , Patient's Chart, lab work & pertinent test results  Airway Mallampati: II TM Distance: >3 FB Neck ROM: Full    Dental no notable dental hx. (+) Edentulous Upper, Edentulous Lower   Pulmonary former smoker,  breath sounds clear to auscultation  Pulmonary exam normal       Cardiovascular negative cardio ROS  Rhythm:Regular Rate:Normal     Neuro/Psych negative neurological ROS  negative psych ROS   GI/Hepatic Neg liver ROS, hiatal hernia, GERD-  Medicated,  Endo/Other  negative endocrine ROS  Renal/GU Renal InsufficiencyRenal disease  negative genitourinary   Musculoskeletal negative musculoskeletal ROS (+)   Abdominal   Peds negative pediatric ROS (+)  Hematology negative hematology ROS (+)   Anesthesia Other Findings   Reproductive/Obstetrics negative OB ROS                          Anesthesia Physical Anesthesia Plan  ASA: II and emergent  Anesthesia Plan: General   Post-op Pain Management:    Induction: Intravenous  Airway Management Planned: Oral ETT  Additional Equipment:   Intra-op Plan:   Post-operative Plan: Extubation in OR  Informed Consent: I have reviewed the patients History and Physical, chart, labs and discussed the procedure including the risks, benefits and alternatives for the proposed anesthesia with the patient or authorized representative who has indicated his/her understanding and acceptance.   Dental advisory given  Plan Discussed with: CRNA  Anesthesia Plan Comments:         Anesthesia Quick Evaluation

## 2014-04-17 ENCOUNTER — Encounter (HOSPITAL_COMMUNITY): Payer: Self-pay | Admitting: Urology

## 2014-04-17 DIAGNOSIS — N200 Calculus of kidney: Secondary | ICD-10-CM | POA: Diagnosis present

## 2014-04-17 LAB — CBC
HCT: 41.1 % (ref 39.0–52.0)
HEMOGLOBIN: 13.4 g/dL (ref 13.0–17.0)
MCH: 30.5 pg (ref 26.0–34.0)
MCHC: 32.6 g/dL (ref 30.0–36.0)
MCV: 93.6 fL (ref 78.0–100.0)
PLATELETS: 145 10*3/uL — AB (ref 150–400)
RBC: 4.39 MIL/uL (ref 4.22–5.81)
RDW: 14.5 % (ref 11.5–15.5)
WBC: 6.1 10*3/uL (ref 4.0–10.5)

## 2014-04-17 LAB — BASIC METABOLIC PANEL
BUN: 22 mg/dL (ref 6–23)
CALCIUM: 8.7 mg/dL (ref 8.4–10.5)
CO2: 26 meq/L (ref 19–32)
Chloride: 106 mEq/L (ref 96–112)
Creatinine, Ser: 1.4 mg/dL — ABNORMAL HIGH (ref 0.50–1.35)
GFR calc Af Amer: 59 mL/min — ABNORMAL LOW (ref >90)
GFR calc non Af Amer: 50 mL/min — ABNORMAL LOW (ref >90)
GLUCOSE: 158 mg/dL — AB (ref 70–99)
Potassium: 3.6 mEq/L — ABNORMAL LOW (ref 3.7–5.3)
Sodium: 141 mEq/L (ref 137–147)

## 2014-04-17 NOTE — Progress Notes (Signed)
Patient changed to inpatient- con't to require IV antibiotics and IVF @ 50cc/hr

## 2014-04-17 NOTE — Anesthesia Postprocedure Evaluation (Signed)
  Anesthesia Post-op Note  Patient: Eddie Walker  Procedure(s) Performed: Procedure(s) (LRB): CYSTOSCOPY WITH STENT PLACEMENT (Left)  Patient Location: PACU  Anesthesia Type: General  Level of Consciousness: awake and alert   Airway and Oxygen Therapy: Patient Spontanous Breathing  Post-op Pain: mild  Post-op Assessment: Post-op Vital signs reviewed, Patient's Cardiovascular Status Stable, Respiratory Function Stable, Patent Airway and No signs of Nausea or vomiting  Last Vitals:  Filed Vitals:   04/17/14 0556  BP: 115/80  Pulse: 86  Temp: 36.8 C  Resp: 18    Post-op Vital Signs: stable   Complications: No apparent anesthesia complications

## 2014-04-17 NOTE — Progress Notes (Signed)
1 Day Post-Op Subjective: Patient reports no new concerns or complaints. He feels quite good at this point. He is having no voiding difficulty. He did have a MAXIMUM TEMPERATURE of 100.6. No chills or increasing abdominal discomfort. There is no urine culture data available. Blood cultures did show gram-positive cocci.  Objective: Vital signs in last 24 hours: Temp:  [98.2 F (36.8 C)-100.6 F (38.1 C)] 98.2 F (36.8 C) (04/22 0556) Pulse Rate:  [86-99] 86 (04/22 0556) Resp:  [16-18] 18 (04/22 0556) BP: (101-132)/(63-80) 115/80 mmHg (04/22 0556) SpO2:  [93 %-96 %] 93 % (04/22 0556)  Intake/Output from previous day: 04/21 0701 - 04/22 0700 In: 1805 [P.O.:600; I.V.:1155; IV Piggyback:50] Out: 1950 [Urine:1950] Intake/Output this shift: Total I/O In: 42 [P.O.:420] Out: 400 [Urine:400]  Physical Exam:  Constitutional: Vital signs reviewed. WD WN in NAD   Eyes: PERRL, No scleral icterus.   Cardiovascular: RRR Pulmonary/Chest: Normal effort Abdominal: Soft. Non-tender, non-distended, bowel sounds are normal, no masses, organomegaly, or guarding present.  Extremities: No cyanosis or edema   Lab Results:  Recent Labs  04/15/14 2152 04/17/14 0351  HGB 14.9 13.4  HCT 44.0 41.1   BMET  Recent Labs  04/15/14 2152 04/17/14 0351  NA 140 141  K 3.7 3.6*  CL 102 106  CO2 24 26  GLUCOSE 173* 158*  BUN 29* 22  CREATININE 1.60* 1.40*  CALCIUM 9.6 8.7   No results found for this basename: LABPT, INR,  in the last 72 hours No results found for this basename: LABURIN,  in the last 72 hours Results for orders placed during the hospital encounter of 04/15/14  CULTURE, BLOOD (ROUTINE X 2)     Status: None   Collection Time    04/15/14  9:58 PM      Result Value Ref Range Status   Specimen Description BLOOD RIGHT ANTECUBITAL   Final   Special Requests BOTTLES DRAWN AEROBIC AND ANAEROBIC 6CC AER [place]   Final   Culture  Setup Time     Final   Value: 04/15/2014 23:22   Performed at [facility]   Culture     Final   Value: GRAM POSITIVE COCCI IN CLUSTERS     Note: Gram Stain Report Called to,Read Back By and Verified With: ELANOR MADRIAGA ON 04/16/2014 AT 8:18P BY WILEJ     Performed at [facility]   Report Status PENDING   Incomplete  CULTURE, BLOOD (ROUTINE X 2)     Status: None   Collection Time    04/15/14 10:02 PM      Result Value Ref Range Status   Specimen Description BLOOD LEFT ANTECUBITAL   Final   Special Requests BOTTLES DRAWN AEROBIC AND ANAEROBIC [facility] EACH   Final   Culture  Setup Time     Final   Value: 04/15/2014 23:22     Performed at [facility]   Culture     Final   Value: GRAM POSITIVE COCCI IN CLUSTERS     Note: Gram Stain Report Called to,Read Back By and Verified With: ELANOR MADRIAGA ON 04/16/2014 AT 8:18P BY WILEJ     Performed at [facility]   Report Status PENDING   Incomplete  SURGICAL PCR SCREEN     Status: Abnormal   Collection Time    04/16/14  4:27 AM      Result Value Ref Range Status   MRSA, PCR NEGATIVE  NEGATIVE Final   Staphylococcus aureus POSITIVE (*) NEGATIVE  Final   Comment:            The Xpert SA Assay (FDA     approved for NASAL specimens     in patients over 78 years of age),     is one component of     a comprehensive surveillance     program.  Test performance has     been validated by [employer] for patients greater     than or equal to 36 year old.     It is not intended     to diagnose infection nor to     guide or monitor treatment.    Studies/Results: Ct Abdomen Pelvis W Contrast  04/15/2014   CLINICAL DATA:  Left abdominal pain  EXAM: CT ABDOMEN AND PELVIS WITH CONTRAST  TECHNIQUE: Multidetector CT imaging of the abdomen and pelvis was performed using the standard protocol following bolus administration of intravenous contrast.  CONTRAST:  60mL OMNIPAQUE IOHEXOL 300 MG/ML SOLN, 62mL OMNIPAQUE IOHEXOL 300 MG/ML SOLN  COMPARISON:  DG ABDOMEN 1V dated  04/04/2014; CT ABD-PEL WO/W CM dated 05/16/2013  FINDINGS: A 1.6 cm left pelvic calculus is associated with left hydronephrosis and slight delayed nephrogram phase in the left kidney. There is some left perinephric stranding. Multiple small calculi are also present in the left renal collecting system. Tiny calculi in the lower pole of the right renal collecting system. No other ureteral calculi are present.  Stable liver cysts. Stable hypodensity in the posterior right kidney and hypodensity in the lower pole of the left kidney.  Gallbladder, spleen, pancreas are within normal limits.  Stable left adrenal nodules.  Stability supports benign etiology.  Prostate gland is mildly prominent.  No free-fluid.  No obvious retroperitoneal adenopathy.  No vertebral compression deformity.  Left inguinal hernia contains adipose tissue.  IMPRESSION: Left renal pelvic calculus associated with secondary findings of left ureteral obstruction.  Bilateral nephrolithiasis.  Chronic changes as described above.   Electronically Signed   By: Maryclare Bean M.D.   On: 04/15/2014 23:14    Assessment/Plan:   Large UPJ stone with hydronephrosis and febrile UTIs/early urosepsis. Patient is clinically markedly better. White blood cell count is now within normal limits. Given preliminary blood culture data he should stay on current parenteral antibiotics until further culture data is available.   LOS: 2 days   Bernestine Amass 04/17/2014, 9:28 AM

## 2014-04-18 MED ORDER — AMOXICILLIN 500 MG PO TABS: 500.0000 mg | ORAL_TABLET | Freq: Two times a day (BID) | ORAL | Status: DC

## 2014-04-18 NOTE — Progress Notes (Signed)
Received patient at 2300. Agree with previous assessment. Will continue to monitor pt. Lyn Deemer C Harlea Goetzinger, RN  

## 2014-04-18 NOTE — Discharge Instructions (Signed)
Bacteremia °Bacteremia occurs when bacteria get in your blood. Normal blood does not usually have bacteria. Bacteremia is one way infections can spread from one part of the body to another. °CAUSES  °· Causes may include anything that allows bacteria to get into the body. Examples are: °· Catheters. °· Intravenous (IV) access tubes. °· Cuts or scrapes of the skin. °· Temporary bacteremia may occur during dental procedures, while brushing your teeth, or during a bowel movement. This rarely causes any symptoms or medical problems. °· Bacteria may also get in the bloodstream as a complication of a bacterial infection elsewhere. This includes infected wounds and bacterial infections of the: °· Lungs (pneumonia). °· Kidneys (pyelonephritis). °· Intestines (enteritis, colitis). °· Organs in the abdomen (appendicitis, cholecystitis, diverticulitis). °SYMPTOMS  °The body is usually able to clear small numbers of bacteria out of the blood quickly. Brief bacteremia usually does not cause problems.  °· Problems can occur if the bacteria start to grow in number or spread to other parts of the body. If the bacteria start growing, you may develop: °· Chills. °· Fever. °· Nausea. °· Vomiting. °· Sweating. °· Lightheadedness and low blood pressure. °· Pain. °· If bacteria start to grow in the linings around the brain, it is called meningitis. This can cause severe headaches, many other problems, and even death. °· If bacteria start to grow in a joint, it causes arthritis with painful joints. If bacteria start to grow in a bone, it is called osteomyelitis. °· Bacteria from the blood can also cause sores (abscesses) in many organs, such as the muscle, liver, spleen, lungs, brain, and kidneys. °DIAGNOSIS  °· This condition is diagnosed by cultures of the blood. °· Cultures may also be taken from other parts of the body that are thought to be causing the bacteremia. A small piece of tissue, fluid, or other product of the body is  sampled. The sample is then put on a growth plate to see if any bacteria grows. °· Other lab tests may be done and the results may be abnormal. °TREATMENT  °Treatment requires a stay in the hospital. You will be given antibiotic medicine through an IV access tube. °PREVENTION  °People with an increased risk of developing bacteremia or complications may be given antibiotics before certain procedures. Examples are: °· A person with a heart murmur or artificial heart valve, before having his or her teeth cleaned. °· Before having a surgical or other invasive procedure. °· Before having a bowel procedure. °Document Released: 09/26/2006 Document Revised: 03/06/2012 Document Reviewed: 07/08/2011 °ExitCare® Patient Information ©2014 [employer]. ° °

## 2014-04-18 NOTE — Discharge Summary (Signed)
Patient ID: Sephiroth Mcluckie MRN: [id number] DOB/AGE: Jun 27, 1946 68 y.o.  Admit date: 04/15/2014 Discharge date: 04/18/2014    Discharge Diagnoses:   Present on Admission:  . Kidney stone on left side . Acute renal failure . UTI (urinary tract infection) . Renal stone  Consults:  None    Discharge Medications:   Medication List         amoxicillin 500 MG tablet  Commonly known as:  AMOXIL  Take 1 tablet (500 mg total) by mouth 2 (two) times daily.     aspirin EC 81 MG tablet  Take 81 mg by mouth daily.     dutasteride 0.5 MG capsule  Commonly known as:  AVODART  Take 0.5 mg by mouth daily.     EPIPEN 0.3 mg/0.3 mL Soaj injection  Generic drug:  EPINEPHrine  Inject 0.3 mg into the muscle as needed (anaphylaxis).     glucosamine-chondroitin 500-400 MG tablet  Take 1 tablet by mouth 2 (two) times daily.     multivitamin with minerals Tabs tablet  Take 1 tablet by mouth daily.     omeprazole 20 MG capsule  Commonly known as:  PRILOSEC  Take 20 mg by mouth daily.         Significant Diagnostic Studies:  Ct Abdomen Pelvis W Contrast  04/15/2014   CLINICAL DATA:  Left abdominal pain  EXAM: CT ABDOMEN AND PELVIS WITH CONTRAST  TECHNIQUE: Multidetector CT imaging of the abdomen and pelvis was performed using the standard protocol following bolus administration of intravenous contrast.  CONTRAST:  86mL OMNIPAQUE IOHEXOL 300 MG/ML SOLN, 93mL OMNIPAQUE IOHEXOL 300 MG/ML SOLN  COMPARISON:  DG ABDOMEN 1V dated 04/04/2014; CT ABD-PEL WO/W CM dated 05/16/2013  FINDINGS: A 1.6 cm left pelvic calculus is associated with left hydronephrosis and slight delayed nephrogram phase in the left kidney. There is some left perinephric stranding. Multiple small calculi are also present in the left renal collecting system. Tiny calculi in the lower pole of the right renal collecting system. No other ureteral calculi are present.  Stable liver cysts. Stable hypodensity in the posterior right kidney  and hypodensity in the lower pole of the left kidney.  Gallbladder, spleen, pancreas are within normal limits.  Stable left adrenal nodules.  Stability supports benign etiology.  Prostate gland is mildly prominent.  No free-fluid.  No obvious retroperitoneal adenopathy.  No vertebral compression deformity.  Left inguinal hernia contains adipose tissue.  IMPRESSION: Left renal pelvic calculus associated with secondary findings of left ureteral obstruction.  Bilateral nephrolithiasis.  Chronic changes as described above.   Electronically Signed   By: Maryclare Bean M.D.   On: 04/15/2014 23:14      Hospital Course:  Principal Problem:   Kidney stone on left side Active Problems:   Acute renal failure   UTI (urinary tract infection)   Renal stone  Patient was known to have a large lower pole left renal stone. He socially presented with flank pain and fever. The stone and migrated to the ureteropelvic junction. The on-call physician for urology placed a double-J stent. The patient had immediate relief of his symptoms. He has remained on IV Rocephin. Blood cultures showed gram-positive cocci on Gram stain. Final culture is pending. Urine culture had to be reintubated. He currently is completely symptom-free. He has been afebrile for 36+ hours. What blood cell count is now normal. Renal function is at baseline. He is to be sent home on oral amoxicillin with careful followup of culture data to  make sure that we'll be appropriate. Day of Discharge BP 136/90  Pulse 81  Temp(Src) 98.1 F (36.7 C) (Oral)  Resp 18  Ht 6\' 1"  (1.854 m)  Wt 205 lb 9.6 oz (93.26 kg)  BMI 27.13 kg/m2  SpO2 95% Multiple well-nourished male in no acute distress. Respiratory: Normal effort Cardiac: Regular rate and rhythm Abdomen: Soft nontender no CVA tenderness Extremities: No tenderness/edema No results found for this or any previous visit (from the past 24 hour(s)).

## 2014-04-18 NOTE — Progress Notes (Signed)
D/C instructions reviewed w/ pt and wife. Both verbalize understanding and all questions answered. Pt d/c in w/c in stable condition to wife's car by NT. Pt in possession of d/c instructions, script, and all personal belongings.

## 2014-04-19 ENCOUNTER — Other Ambulatory Visit: Payer: Self-pay | Admitting: Urology

## 2014-04-19 ENCOUNTER — Encounter (HOSPITAL_COMMUNITY): Payer: Self-pay | Admitting: Pharmacy Technician

## 2014-04-19 LAB — CULTURE, BLOOD (ROUTINE X 2)

## 2014-04-19 LAB — URINE CULTURE

## 2014-04-24 ENCOUNTER — Encounter (HOSPITAL_COMMUNITY): Payer: Self-pay | Admitting: *Deleted

## 2014-04-26 NOTE — H&P (Signed)
Reason For Visit  Patient presents tonight for elective ESWL for a 1.5 cm left lower pole renal stone.     History of Present Illness   Eddie Walker returns today for a routine 42-month followup. He is without complaints. He has no significant voiding concerns and urinalysis looks unremarkable. Again, the last time he was here we made sure that his right-sided hydronephrosis has resolved. He does have a large stone in the lower pole of the left kidney that we are simply observing. He does have a prior history of erectile dysfunction and was treated with Viagra by an outside urologist. He is interested in getting back on that at this time. I see no obvious counter indications.  On KUB today, the patient continues to have a 1.5 cm stone in the lower pole of his left kidney. He does not appear to be significantly changed from previous imaging studies, no obvious other calculi noted. There is a calcification in the lower pelvis may be a pill in the GI tract. I cannot completely rule out the possibility of a bladder calculus.     \\        Past urologic history from initial hospital consultation and initial office visits:       Eddie Walker was seen in early April 2014 with urosepsis and right hydronephrosis. We felt that he has urosepsis was secondary to acute prostatitis. The patient did have a nonobstructing left renal calculus. He did have some nonspecific mild right hydronephrosis but no evidence of a ureteral calculus on that side. As a baseline he had very little in the way of significant voiding complaints despite a history of BPH as well as a history of elevated PSA. He had previously been under the care of outside urologist. His creatinine level is 2.4 at the time of admission but it improved to 1.65 at discharge. The patient had a repeat creatinine in our office which was down to 1.3.     CT scan was performed which showed a 12 x 6 mm stone in his proximal right ureter. HU of stone about 900 units. Also  was noted to have a 2-3 mm stone in proximal left ureter.     Eddie Walker is status post ESWL ( June of 2014) for a approximately a 10 mm right proximal ureteral calculus in June of 2014. Of note, he also had a 2-3 mm proximal left ureteral calculus noted that time a CT imaging which was not treated. The patient passed a significant amount of stone material and a followup KUB appeared to have successful ESWL. The stone turned out to be primarily carbonate apatite. Repeat creatinine was down to 1.0 which was quite encouraging.   Past Medical History Problems  1. History of Gout (274.9) 2. History of Hernia (553.9) 3. History of esophageal reflux (V12.79) 4. History of kidney stones (V13.01)  Surgical History Problems  1. History of Knee Arthroscopy 2. History of Lithotripsy 3. History of Surgery Testis  Current Meds 1. Aspirin 81 MG Oral Tablet;  Therapy: (Recorded:17Apr2014) to Recorded 2. Finasteride 5 MG Oral Tablet; Take 1 tablet daily;  Therapy: [id number] to (Evaluate:11May2015)  Requested for: [id number]; Last  Rx:16May2014 Ordered 3. Glucosamine-Chondroitin Oral Capsule;  Therapy: (Recorded:17Apr2014) to Recorded 4. Multi-Vitamin Oral Tablet;  Therapy: (Recorded:17Apr2014) to Recorded 5. Omeprazole Magnesium 20.6 (20 Base) MG Oral Capsule Delayed Release;  Therapy: (Recorded:17Apr2014) to Recorded  Allergies Medication  1. Lidocaine HCl SOLN 2. Novocain SOLN 3. Procaine HCl SOLN Non-Medication  4. Peanuts  Family  History Problems  1. Family history of Benign Prostatic Hypertrophy : Brother 2. Family history of Death In The Family Father   age 64 of Parkinson's 73. Family history of Diverticulitis Of Colon : Mother 4. Family history of Family Health Status Number Of Children   1 adopted son     2 adopted daughters    and 1 stepson 5. Family history of Hiatal Hernia : Father 56. Family history of Parkinson's Disease : Father  Social History Problems  1. Alcohol Use    2 glasses wine a week 2. Denied: History of Caffeine Use 3. History of tobacco use (V15.82)   smoked 1 ppd x 43 years     Quit   3+ years ago 4. Marital History - Currently Married 5. Occupation: Retired  Electronics engineer, constitutional, skin, eye, otolaryngeal, hematologic/lymphatic, cardiovascular, pulmonary, endocrine, musculoskeletal, gastrointestinal, neurological and psychiatric system(s) were reviewed and pertinent findings if present are noted.  Genitourinary: nocturia, but no dysuria and no hematuria.  Gastrointestinal: no flank pain and no abdominal pain.  Hematologic/Lymphatic: a tendency to easily bruise.    Vitals Vital Signs [Data Includes: Last 1 Day]  Recorded: 09Apr2015 08:55AM  Height: 6 ft 1 in Weight: 200 lb  BMI Calculated: 26.39 BSA Calculated: 2.15 Blood Pressure: 141 / 95 Temperature: 97.9 F Heart Rate: 61 Well-developed well-nourished male in no acute distress Respiratory: Normal effort Cardiac: Regular rate and rhythm Abdomen: Soft nontender Extremities: No edema/tenderness  Results/Data Urine [Data Includes: Last 1 Day]   09Apr2015 COLOR YELLOW  APPEARANCE CLOUDY  SPECIFIC GRAVITY 1.020  pH 6.5  GLUCOSE NEG mg/dL BILIRUBIN NEG  KETONE NEG mg/dL BLOOD SMALL  PROTEIN TRACE mg/dL UROBILINOGEN 0.2 mg/dL NITRITE POS  LEUKOCYTE ESTERASE SMALL  SQUAMOUS EPITHELIAL/HPF RARE  WBC 11-20 WBC/hpf RBC 3-6 RBC/hpf BACTERIA FEW  CRYSTALS NONE SEEN  CASTS NONE SEEN  Other AMORPHOUS NOTED   Assessment Assessed  1. Benign prostatic hyperplasia with urinary obstruction (600.01,599.69) 2. 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 UA With REFLEX; [Do Not Release]; Status:Complete;   Done: [id number] 08:28AM Kidney stone on left side  2. KUB; Status:Hold For - Appointment,Date of Service; Requested for:09Apr2015;  3. Follow-up Year x 1 Office  Follow-up  Status: Hold For  - Appointment,Date of Service   Requested for: 09Apr2015 Organic erectile dysfunction  4. Start: Viagra 100 MG Oral Tablet; TAKE AS DIRECTED  Discussion/Summary       Eddie Walker has elected to proceed with ESWL for his left lower pole renal calculus.  Risks benefits of this procedure discussed with him.  He has undergone lithotripsy in the past.  This be scheduled for sometime in the next several weeks.

## 2014-04-28 MED ORDER — DEXTROSE 5 % IV SOLN
2.0000 g | Freq: Once | INTRAVENOUS | Status: AC
  Administered 2014-04-29: 2 g via INTRAVENOUS
  Filled 2014-04-28: qty 2

## 2014-04-29 ENCOUNTER — Ambulatory Visit (HOSPITAL_COMMUNITY)
Admission: RE | Admit: 2014-04-29 | Discharge: 2014-04-29 | Disposition: A | Discharge status: Home / Self Care | Payer: Medicare Other | Source: Ambulatory Visit | Attending: Urology | Admitting: Urology

## 2014-04-29 ENCOUNTER — Encounter (HOSPITAL_COMMUNITY)
Admission: RE | Disposition: A | Discharge status: Home / Self Care | Payer: Self-pay | Source: Ambulatory Visit | Attending: Urology

## 2014-04-29 ENCOUNTER — Ambulatory Visit (HOSPITAL_COMMUNITY): Payer: Medicare Other

## 2014-04-29 ENCOUNTER — Encounter (HOSPITAL_COMMUNITY): Payer: Self-pay | Admitting: *Deleted

## 2014-04-29 DIAGNOSIS — N401 Enlarged prostate with lower urinary tract symptoms: Secondary | ICD-10-CM | POA: Insufficient documentation

## 2014-04-29 DIAGNOSIS — N139 Obstructive and reflux uropathy, unspecified: Secondary | ICD-10-CM | POA: Insufficient documentation

## 2014-04-29 DIAGNOSIS — N200 Calculus of kidney: Secondary | ICD-10-CM | POA: Diagnosis not present

## 2014-04-29 DIAGNOSIS — Z884 Allergy status to anesthetic agent status: Secondary | ICD-10-CM | POA: Diagnosis not present

## 2014-04-29 DIAGNOSIS — N529 Male erectile dysfunction, unspecified: Secondary | ICD-10-CM | POA: Diagnosis not present

## 2014-04-29 DIAGNOSIS — Z7982 Long term (current) use of aspirin: Secondary | ICD-10-CM | POA: Insufficient documentation

## 2014-04-29 DIAGNOSIS — K219 Gastro-esophageal reflux disease without esophagitis: Secondary | ICD-10-CM | POA: Diagnosis not present

## 2014-04-29 DIAGNOSIS — N138 Other obstructive and reflux uropathy: Secondary | ICD-10-CM | POA: Insufficient documentation

## 2014-04-29 DIAGNOSIS — M109 Gout, unspecified: Secondary | ICD-10-CM | POA: Insufficient documentation

## 2014-04-29 DIAGNOSIS — Z91010 Allergy to peanuts: Secondary | ICD-10-CM | POA: Diagnosis not present

## 2014-04-29 DIAGNOSIS — Z01818 Encounter for other preprocedural examination: Secondary | ICD-10-CM | POA: Diagnosis not present

## 2014-04-29 DIAGNOSIS — Z79899 Other long term (current) drug therapy: Secondary | ICD-10-CM | POA: Insufficient documentation

## 2014-04-29 SURGERY — LITHOTRIPSY, ESWL
Anesthesia: LOCAL | Laterality: Left

## 2014-04-29 MED ORDER — DIPHENHYDRAMINE HCL 25 MG PO CAPS
25.0000 mg | ORAL_CAPSULE | ORAL | Status: AC
  Administered 2014-04-29: 25 mg via ORAL
  Filled 2014-04-29: qty 1

## 2014-04-29 MED ORDER — DEXTROSE-NACL 5-0.45 % IV SOLN
INTRAVENOUS | Status: DC
  Administered 2014-04-29: 09:00:00 via INTRAVENOUS

## 2014-04-29 MED ORDER — DIAZEPAM 5 MG PO TABS
10.0000 mg | ORAL_TABLET | ORAL | Status: AC
  Administered 2014-04-29: 10 mg via ORAL
  Filled 2014-04-29: qty 2

## 2014-04-29 NOTE — Interval H&P Note (Signed)
History and Physical Interval Note:  Had JJ stent placed about 2 weeks ago secondary to migration of stone to UPJ and fever. UTI treated. Stent in good  Position and stone now back in lower pole  04/29/2014 10:13 AM  Lyndal Pulley  has presented today for surgery, with the diagnosis of Left Renal Calculus  The various methods of treatment have been discussed with the patient and family. After consideration of risks, benefits and other options for treatment, the patient has consented to  Procedure(s): LEFT EXTRACORPOREAL SHOCK WAVE LITHOTRIPSY (ESWL) (Left) as a surgical intervention .  The patient's history has been reviewed, patient examined, no change in status, stable for surgery.  I have reviewed the patient's chart and labs.  Questions were answered to the patient's satisfaction.     Bernestine Amass

## 2014-04-29 NOTE — Discharge Instructions (Signed)
See [facility] discharge instructions in chart.  

## 2014-04-29 NOTE — Op Note (Signed)
See [facility] OP note scanned into chart. 

## 2014-05-13 DIAGNOSIS — N200 Calculus of kidney: Secondary | ICD-10-CM | POA: Diagnosis not present

## 2014-05-13 DIAGNOSIS — N201 Calculus of ureter: Secondary | ICD-10-CM | POA: Diagnosis not present

## 2014-05-13 DIAGNOSIS — N390 Urinary tract infection, site not specified: Secondary | ICD-10-CM | POA: Diagnosis not present

## 2014-05-28 DIAGNOSIS — N201 Calculus of ureter: Secondary | ICD-10-CM | POA: Diagnosis not present

## 2014-08-29 DIAGNOSIS — N139 Obstructive and reflux uropathy, unspecified: Secondary | ICD-10-CM | POA: Diagnosis not present

## 2014-08-29 DIAGNOSIS — N401 Enlarged prostate with lower urinary tract symptoms: Secondary | ICD-10-CM | POA: Diagnosis not present

## 2014-08-29 DIAGNOSIS — N200 Calculus of kidney: Secondary | ICD-10-CM | POA: Diagnosis not present

## 2014-09-06 DIAGNOSIS — M109 Gout, unspecified: Secondary | ICD-10-CM | POA: Diagnosis not present

## 2014-09-06 DIAGNOSIS — R7309 Other abnormal glucose: Secondary | ICD-10-CM | POA: Diagnosis not present

## 2014-09-06 DIAGNOSIS — R82998 Other abnormal findings in urine: Secondary | ICD-10-CM | POA: Diagnosis not present

## 2014-09-06 DIAGNOSIS — E785 Hyperlipidemia, unspecified: Secondary | ICD-10-CM | POA: Diagnosis not present

## 2014-09-06 DIAGNOSIS — Z125 Encounter for screening for malignant neoplasm of prostate: Secondary | ICD-10-CM | POA: Diagnosis not present

## 2014-09-13 DIAGNOSIS — K439 Ventral hernia without obstruction or gangrene: Secondary | ICD-10-CM | POA: Diagnosis not present

## 2014-09-13 DIAGNOSIS — Z0000 Encounter for general adult medical examination without abnormal findings: Secondary | ICD-10-CM | POA: Diagnosis not present

## 2014-09-13 DIAGNOSIS — N200 Calculus of kidney: Secondary | ICD-10-CM | POA: Diagnosis not present

## 2014-09-13 DIAGNOSIS — R7309 Other abnormal glucose: Secondary | ICD-10-CM | POA: Diagnosis not present

## 2014-09-13 DIAGNOSIS — Z1331 Encounter for screening for depression: Secondary | ICD-10-CM | POA: Diagnosis not present

## 2014-09-13 DIAGNOSIS — M109 Gout, unspecified: Secondary | ICD-10-CM | POA: Diagnosis not present

## 2014-09-13 DIAGNOSIS — E669 Obesity, unspecified: Secondary | ICD-10-CM | POA: Diagnosis not present

## 2014-09-13 DIAGNOSIS — E785 Hyperlipidemia, unspecified: Secondary | ICD-10-CM | POA: Diagnosis not present

## 2014-09-13 DIAGNOSIS — R972 Elevated prostate specific antigen [PSA]: Secondary | ICD-10-CM | POA: Diagnosis not present

## 2014-09-16 DIAGNOSIS — Z1212 Encounter for screening for malignant neoplasm of rectum: Secondary | ICD-10-CM | POA: Diagnosis not present

## 2014-09-16 DIAGNOSIS — M109 Gout, unspecified: Secondary | ICD-10-CM | POA: Diagnosis not present

## 2014-09-16 DIAGNOSIS — R972 Elevated prostate specific antigen [PSA]: Secondary | ICD-10-CM | POA: Diagnosis not present

## 2014-09-16 DIAGNOSIS — N200 Calculus of kidney: Secondary | ICD-10-CM | POA: Diagnosis not present

## 2014-09-16 DIAGNOSIS — R7309 Other abnormal glucose: Secondary | ICD-10-CM | POA: Diagnosis not present

## 2014-09-16 DIAGNOSIS — K439 Ventral hernia without obstruction or gangrene: Secondary | ICD-10-CM | POA: Diagnosis not present

## 2014-09-16 DIAGNOSIS — E669 Obesity, unspecified: Secondary | ICD-10-CM | POA: Diagnosis not present

## 2014-09-16 DIAGNOSIS — Z0000 Encounter for general adult medical examination without abnormal findings: Secondary | ICD-10-CM | POA: Diagnosis not present

## 2014-09-16 DIAGNOSIS — E785 Hyperlipidemia, unspecified: Secondary | ICD-10-CM | POA: Diagnosis not present

## 2014-12-18 IMAGING — CT CT ABD-PELV W/ CM
2 of 5 series · 17 of 46 positions shown, 19 images · IV contrast (APPLIED)
Comparison: DG ABDOMEN 1V dated 04/04/2014; CT ABD-PEL WO/W CM dated
05/16/2013

CLINICAL DATA: Left abdominal pain

EXAM:
CT ABDOMEN AND PELVIS WITH CONTRAST
TECHNIQUE: Multidetector CT imaging of the abdomen and pelvis was performed
using the standard protocol following bolus administration of
intravenous contrast.
CONTRAST:  50mL OMNIPAQUE IOHEXOL 300 MG/ML SOLN, 80mL OMNIPAQUE
IOHEXOL 300 MG/ML SOLN

[Series 2: abd/pelvis 5.0 b31f · axial · 0.75mm/px · z∈[+946,+1346]mm · 14 of 91 slices shown, 16 images]
[im 6/91  soft-tissue]
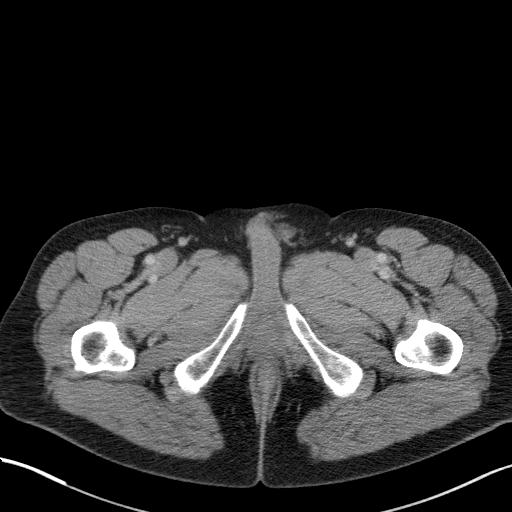
[im 6/91  bone]
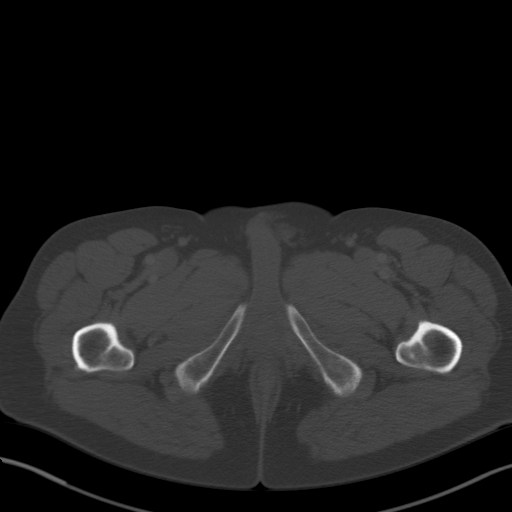
[im 11/91  soft-tissue]
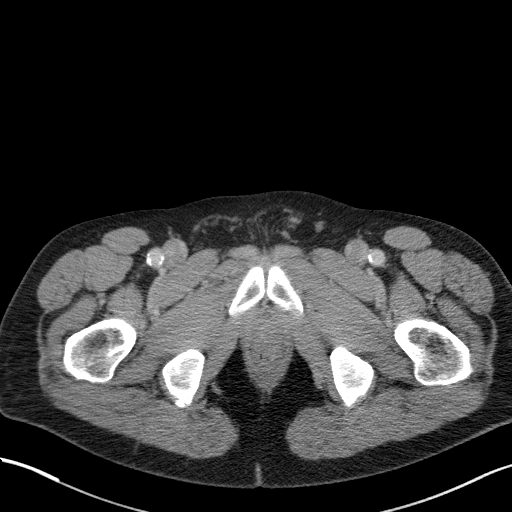
[im 21/91  soft-tissue]
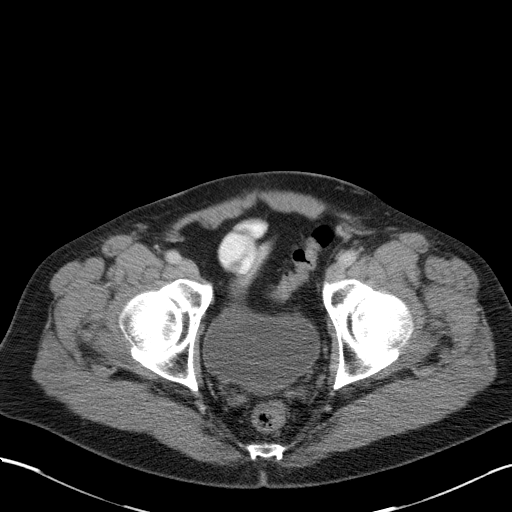
[im 26/91  soft-tissue]
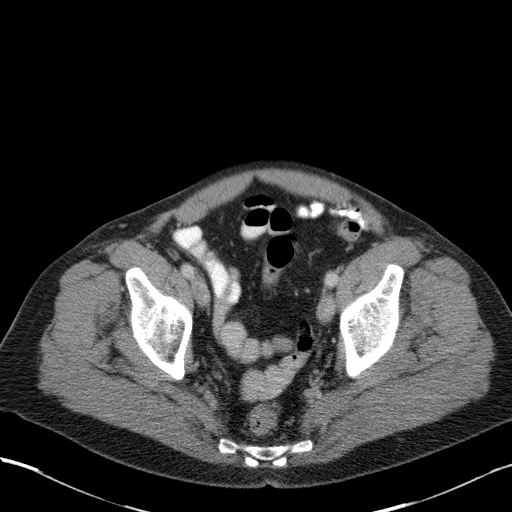
[im 31/91  soft-tissue]
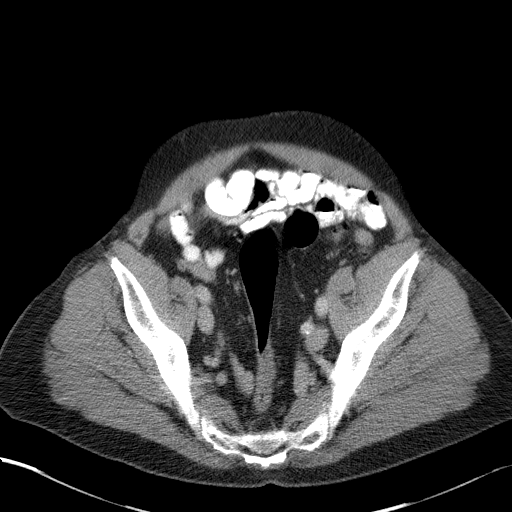
[im 36/91  soft-tissue]
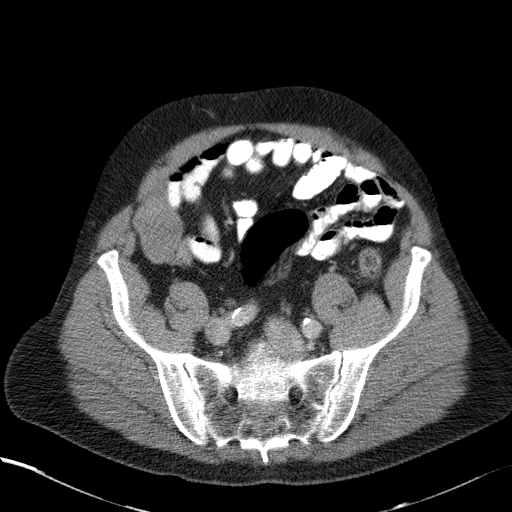
[im 41/91  soft-tissue]
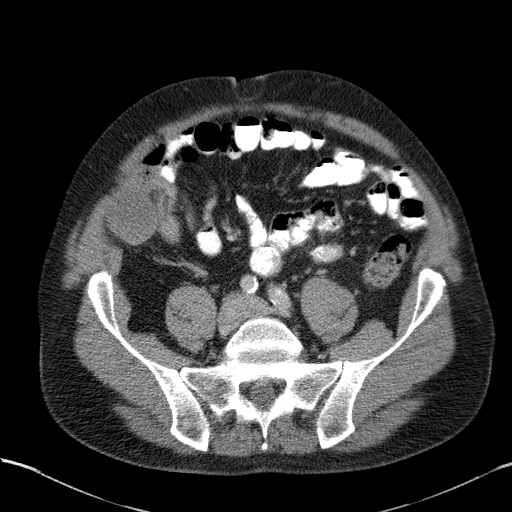
[im 51/91  soft-tissue]
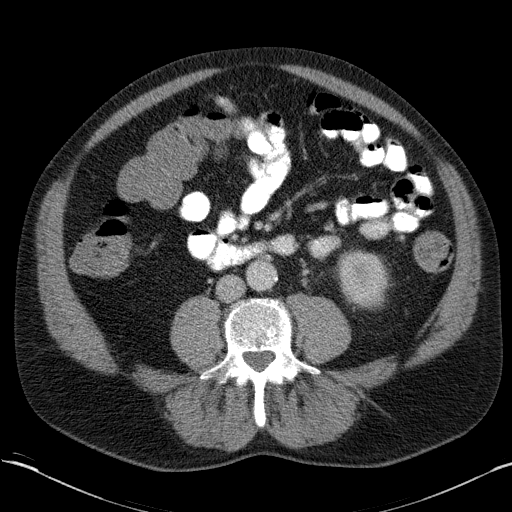
[im 56/91  soft-tissue]
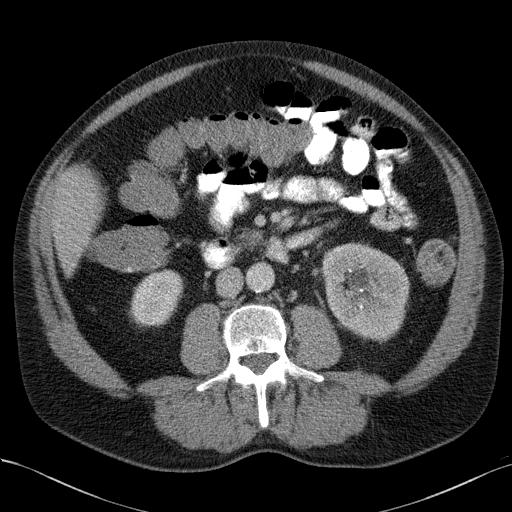
[im 56/91  bone]
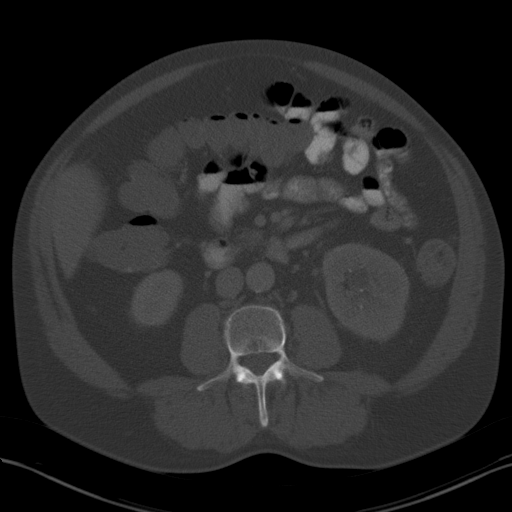
[im 61/91  soft-tissue]
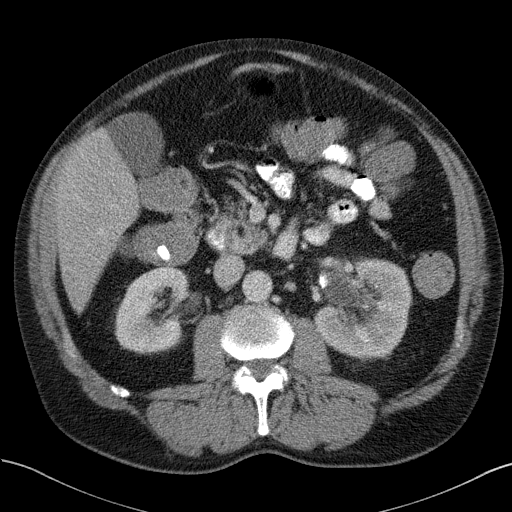
[im 66/91  soft-tissue]
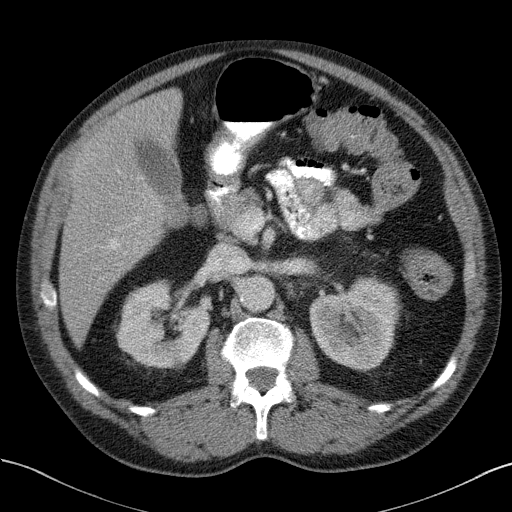
[im 71/91  soft-tissue]
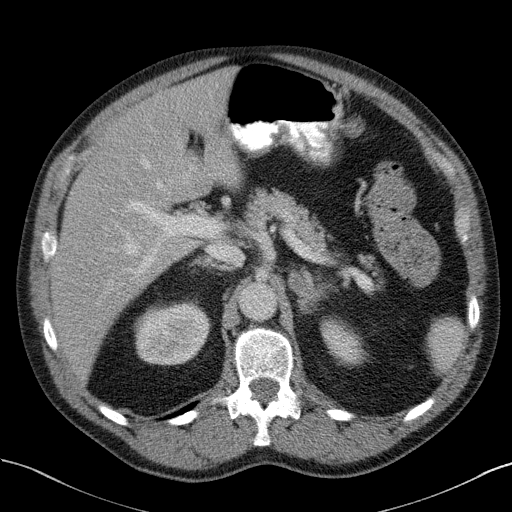
[im 81/91  soft-tissue]
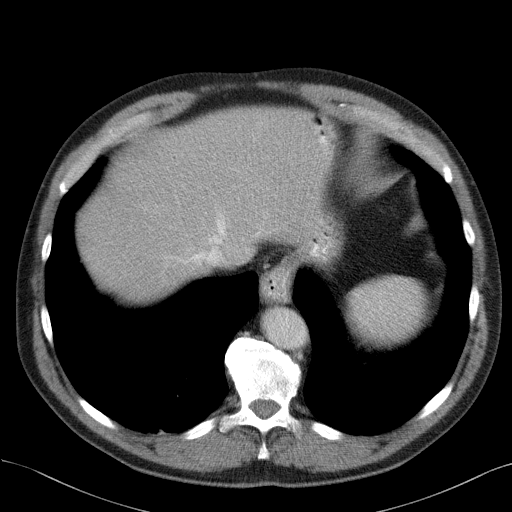
[im 86/91  soft-tissue]
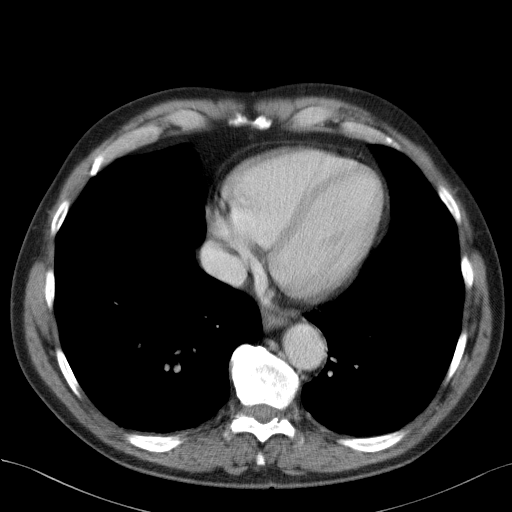

[Series 5: abd/pelvis 3.0 coronal · coronal · 0.88mm/px · 3 of 110 slices shown]
[im 37/110  soft-tissue]
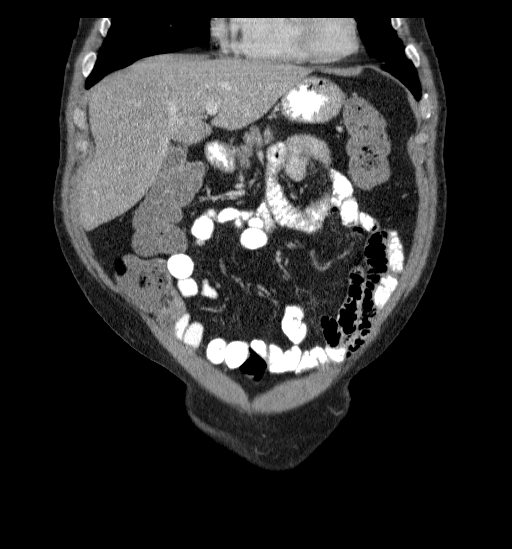
[im 49/110  soft-tissue]
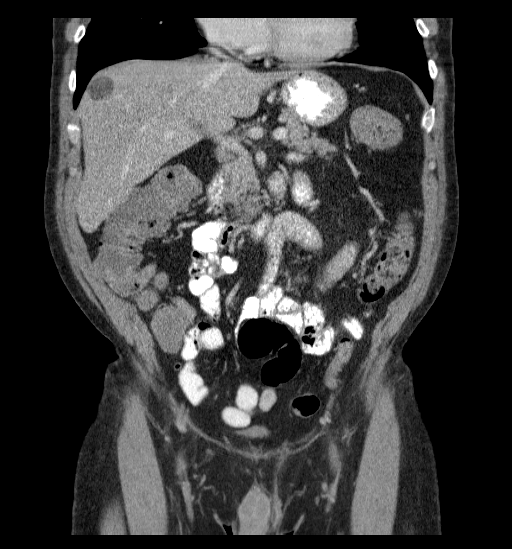
[im 61/110  soft-tissue]
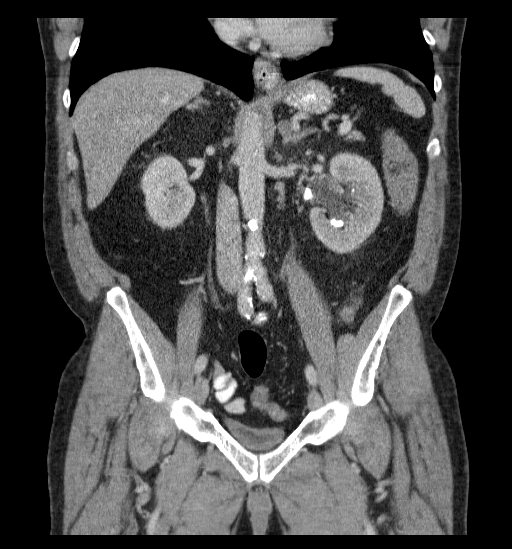

[17 of 46 positions shown; findings below may reference images not displayed]

FINDINGS: A 1.6 cm left pelvic calculus is associated with left hydronephrosis
and slight delayed nephrogram phase in the left kidney. There is
some left perinephric stranding. Multiple small calculi are also
present in the left renal collecting system. Tiny calculi in the
lower pole of the right renal collecting system. No other ureteral
calculi are present.

Stable liver cysts. Stable hypodensity in the posterior right kidney
and hypodensity in the lower pole of the left kidney.

Gallbladder, spleen, pancreas are within normal limits.

Stable left adrenal nodules.  Stability supports benign etiology.

Prostate gland is mildly prominent.

No free-fluid.  No obvious retroperitoneal adenopathy.

No vertebral compression deformity.

Left inguinal hernia contains adipose tissue.
IMPRESSION: Left renal pelvic calculus associated with secondary findings of
left ureteral obstruction.

Bilateral nephrolithiasis.

Chronic changes as described above.

## 2015-08-27 ENCOUNTER — Emergency Department (HOSPITAL_BASED_OUTPATIENT_CLINIC_OR_DEPARTMENT_OTHER): Payer: Medicare Other

## 2015-08-27 ENCOUNTER — Encounter (HOSPITAL_BASED_OUTPATIENT_CLINIC_OR_DEPARTMENT_OTHER): Payer: Self-pay

## 2015-08-27 ENCOUNTER — Emergency Department (HOSPITAL_BASED_OUTPATIENT_CLINIC_OR_DEPARTMENT_OTHER)
Admission: EM | Admit: 2015-08-27 | Discharge: 2015-08-27 | Disposition: A | Discharge status: Home / Self Care | Payer: Medicare Other | Attending: Emergency Medicine | Admitting: Emergency Medicine

## 2015-08-27 DIAGNOSIS — Z79899 Other long term (current) drug therapy: Secondary | ICD-10-CM | POA: Diagnosis not present

## 2015-08-27 DIAGNOSIS — Z87891 Personal history of nicotine dependence: Secondary | ICD-10-CM | POA: Insufficient documentation

## 2015-08-27 DIAGNOSIS — N200 Calculus of kidney: Secondary | ICD-10-CM | POA: Diagnosis not present

## 2015-08-27 DIAGNOSIS — R0602 Shortness of breath: Secondary | ICD-10-CM | POA: Diagnosis present

## 2015-08-27 DIAGNOSIS — R0600 Dyspnea, unspecified: Secondary | ICD-10-CM | POA: Diagnosis not present

## 2015-08-27 DIAGNOSIS — Z87442 Personal history of urinary calculi: Secondary | ICD-10-CM | POA: Insufficient documentation

## 2015-08-27 DIAGNOSIS — K5900 Constipation, unspecified: Secondary | ICD-10-CM | POA: Diagnosis not present

## 2015-08-27 DIAGNOSIS — J40 Bronchitis, not specified as acute or chronic: Secondary | ICD-10-CM | POA: Insufficient documentation

## 2015-08-27 DIAGNOSIS — Z792 Long term (current) use of antibiotics: Secondary | ICD-10-CM | POA: Insufficient documentation

## 2015-08-27 DIAGNOSIS — R0789 Other chest pain: Secondary | ICD-10-CM | POA: Diagnosis not present

## 2015-08-27 LAB — BASIC METABOLIC PANEL
Anion gap: 7 (ref 5–15)
BUN: 23 mg/dL — AB (ref 6–20)
CO2: 26 mmol/L (ref 22–32)
Calcium: 9.3 mg/dL (ref 8.9–10.3)
Chloride: 109 mmol/L (ref 101–111)
Creatinine, Ser: 1.09 mg/dL (ref 0.61–1.24)
GFR calc Af Amer: >60 mL/min (ref >60)
Glucose, Bld: 133 mg/dL — ABNORMAL HIGH (ref 65–99)
Potassium: 3.8 mmol/L (ref 3.5–5.1)
Sodium: 142 mmol/L (ref 135–145)

## 2015-08-27 LAB — CBC WITH DIFFERENTIAL/PLATELET
Basophils Absolute: 0 10*3/uL (ref 0.0–0.1)
Basophils Relative: 0 % (ref 0–1)
EOS PCT: 4 % (ref 0–5)
Eosinophils Absolute: 0.3 10*3/uL (ref 0.0–0.7)
HEMATOCRIT: 45.8 % (ref 39.0–52.0)
Hemoglobin: 14.8 g/dL (ref 13.0–17.0)
LYMPHS ABS: 2.1 10*3/uL (ref 0.7–4.0)
LYMPHS PCT: 25 % (ref 12–46)
MCH: 30.3 pg (ref 26.0–34.0)
MCHC: 32.3 g/dL (ref 30.0–36.0)
MCV: 93.9 fL (ref 78.0–100.0)
MONO ABS: 0.7 10*3/uL (ref 0.1–1.0)
Monocytes Relative: 9 % (ref 3–12)
NEUTROS ABS: 5.2 10*3/uL (ref 1.7–7.7)
Neutrophils Relative %: 62 % (ref 43–77)
PLATELETS: 203 10*3/uL (ref 150–400)
RBC: 4.88 MIL/uL (ref 4.22–5.81)
RDW: 13.8 % (ref 11.5–15.5)
WBC: 8.5 10*3/uL (ref 4.0–10.5)

## 2015-08-27 LAB — TROPONIN I: Troponin I: <0.03 ng/mL (ref <0.031)

## 2015-08-27 MED ORDER — ALBUTEROL SULFATE (2.5 MG/3ML) 0.083% IN NEBU
2.5000 mg | INHALATION_SOLUTION | Freq: Once | RESPIRATORY_TRACT | Status: AC
  Administered 2015-08-27: 2.5 mg via RESPIRATORY_TRACT
  Filled 2015-08-27: qty 3

## 2015-08-27 MED ORDER — ALBUTEROL SULFATE HFA 108 (90 BASE) MCG/ACT IN AERS: 1.0000 | INHALATION_SPRAY | Freq: Four times a day (QID) | RESPIRATORY_TRACT | Status: DC | PRN

## 2015-08-27 MED ORDER — GI COCKTAIL ~~LOC~~
30.0000 mL | Freq: Once | ORAL | Status: AC
Start: 2015-08-27 — End: 2015-08-27
  Administered 2015-08-27: 30 mL via ORAL
  Filled 2015-08-27: qty 30

## 2015-08-27 MED ORDER — E-Z SPACER DEVI: Status: DC

## 2015-08-27 MED ORDER — KETOROLAC TROMETHAMINE 30 MG/ML IJ SOLN
30.0000 mg | Freq: Once | INTRAMUSCULAR | Status: AC
  Administered 2015-08-27: 30 mg via INTRAVENOUS
  Filled 2015-08-27: qty 1

## 2015-08-27 MED ORDER — POLYETHYLENE GLYCOL 3350 17 GM/SCOOP PO POWD: 17.0000 g | Freq: Every day | ORAL | Status: DC

## 2015-08-27 MED ORDER — PREDNISONE 20 MG PO TABS: ORAL_TABLET | ORAL | Status: DC

## 2015-08-27 MED ORDER — OMEPRAZOLE 20 MG PO CPDR: 20.0000 mg | DELAYED_RELEASE_CAPSULE | Freq: Every day | ORAL | Status: DC

## 2015-08-27 NOTE — ED Notes (Signed)
Pt c/o chest heaviness and difficulty breathing since 0230 this morning, states he "feels like an elephant is sitting on my chest," no hx of lung problems, no arm or back pain, no nausea, c/o headache currently.

## 2015-08-27 NOTE — ED Notes (Signed)
Pt verbalizes understanding of d/c instructions and denies any further needs at this time. 

## 2015-08-27 NOTE — ED Provider Notes (Signed)
CSN: [id number]     Arrival date & time 08/27/15  7829 History   First MD Initiated Contact with Patient 08/27/15 0354     Chief Complaint  Patient presents with  . Shortness of Breath  . Chest Pain     (Consider location/radiation/quality/duration/timing/severity/associated sxs/prior Treatment) Patient is a 69 y.o. male presenting with shortness of breath and chest pain. The history is provided by the patient.  Shortness of Breath Severity:  Moderate Onset quality:  Sudden Timing:  Constant Progression:  Unchanged Chronicity:  New Context: not weather changes   Relieved by:  Nothing Worsened by:  Nothing tried Ineffective treatments:  None tried Associated symptoms: cough   Associated symptoms: no abdominal pain, no diaphoresis, no fever, no hemoptysis and no vomiting   Risk factors: no recent surgery   Chest Pain Associated symptoms: cough and shortness of breath   Associated symptoms: no abdominal pain, no diaphoresis, no fever, no nausea, no palpitations and not vomiting   It is really not so much chest pain as he feels tight and an like he cannot get a deep breath.  There is no DOE (he was out on the golf course and has no Cp no SOB no doe). He has developed a dry cough this evening.  Patient was around a tree stump that was ground up.  He ate dinner at [employer] and ate cajun chicken and shrimp with garlic sauce.  He does not have abdominal pain.  No n/v/d.    Past Medical History  Diagnosis Date  . Hernia 01/2013  . Kidney stone 2008  . GERD (gastroesophageal reflux disease)   . H/O hiatal hernia    Past Surgical History  Procedure Laterality Date  . Orchiectomy Right 1971  . Knee arthroscopy Left 2008  . Hernia repair      left  . Teeth pulled  2010  . Extracorporeal shock wave lithotripsy Right 2014  . Cystoscopy with stent placement Left 04/16/2014    Procedure: CYSTOSCOPY WITH STENT PLACEMENT;  Surgeon: Irine Seal, MD;  Location: [facility];  Service:  Urology;  Laterality: Left;   History reviewed. No pertinent family history. Social History  Substance Use Topics  . Smoking status: Former Smoker -- 1.50 packs/day    Types: Cigarettes, Pipe, Cigars    Quit date: 11/28/2009  . Smokeless tobacco: None  . Alcohol Use: 0.6 oz/week    1 Glasses of wine per week    Review of Systems  Constitutional: Negative for fever and diaphoresis.  Respiratory: Positive for cough, chest tightness and shortness of breath. Negative for hemoptysis.   Cardiovascular: Negative for palpitations and leg swelling.  Gastrointestinal: Positive for abdominal distention. Negative for nausea, vomiting, abdominal pain and diarrhea.  All other systems reviewed and are negative.     Allergies  Peanut-containing drug products and Novocain  Home Medications   Prior to Admission medications   Medication Sig Start Date End Date Taking? Authorizing Provider  acetaminophen (TYLENOL) 500 MG tablet Take 500 mg by mouth every 6 (six) hours as needed for mild pain.    Historical Provider, MD  amoxicillin (AMOXIL) 500 MG tablet Take 1 tablet (500 mg total) by mouth 2 (two) times daily. 04/18/14   Rana Snare, MD  dutasteride (AVODART) 0.5 MG capsule Take 0.5 mg by mouth daily.    Historical Provider, MD  EPINEPHrine (EPIPEN) 0.3 mg/0.3 mL SOAJ injection Inject 0.3 mg into the muscle as needed (anaphylaxis).    Historical Provider, MD  glucosamine-chondroitin  500-400 MG tablet Take 1 tablet by mouth 2 (two) times daily.     Historical Provider, MD  Multiple Vitamin (MULTIVITAMIN WITH MINERALS) TABS Take 1 tablet by mouth daily.    Historical Provider, MD  omeprazole (PRILOSEC) 20 MG capsule Take 20 mg by mouth daily.    Historical Provider, MD   BP 169/108 mmHg  Pulse 74  Temp(Src) 98.7 F (37.1 C) (Oral)  Resp 18  Ht 6\' 1"  (1.854 m)  Wt 205 lb (92.987 kg)  BMI 27.05 kg/m2  SpO2 92% Physical Exam  Constitutional: He is oriented to person, place, and time. He  appears well-developed and well-nourished. No distress.  HENT:  Head: Normocephalic and atraumatic.  Mouth/Throat: Oropharynx is clear and moist.  Eyes: Conjunctivae are normal.  Neck: Normal range of motion. Neck supple.  Cardiovascular: Normal rate, regular rhythm and intact distal pulses.   Pulmonary/Chest: Effort normal. No accessory muscle usage. No respiratory distress. He has decreased breath sounds. He has no wheezes. He has no rhonchi. He has no rales.  Abdominal: Soft. He exhibits distension. Bowel sounds are decreased. There is no tenderness. There is no rebound, no guarding, no tenderness at McBurney's point and negative Murphy's sign.  Musculoskeletal: Normal range of motion. He exhibits no edema or tenderness.  Neurological: He is alert and oriented to person, place, and time. He has normal reflexes.  Skin: Skin is warm and dry. He is not diaphoretic.  Psychiatric: He has a normal mood and affect.    ED Course  Procedures (including critical care time) Labs Review Labs Reviewed  CBC WITH DIFFERENTIAL/PLATELET  BASIC METABOLIC PANEL  TROPONIN I    Imaging Review No results found. I have personally reviewed and evaluated these images and lab results as part of my medical decision-making.   EKG Interpretation   Date/Time:  Wednesday August 27 2015 03:50:25 EDT Ventricular Rate:  71 PR Interval:  178 QRS Duration: 96 QT Interval:  400 QTC Calculation: 434 R Axis:   61 Text Interpretation:  Normal sinus rhythm Confirmed by [facility]  MD,  Andrey Mccaskill ([id number]) on 08/27/2015 3:57:25 AM      MDM   Final diagnoses:  None    Medications  gi cocktail (Maalox,Lidocaine,Donnatal) (30 mLs Oral Given 08/27/15 0406)  albuterol (PROVENTIL) (2.5 MG/3ML) 0.083% nebulizer solution 2.5 mg (2.5 mg Nebulization Given 08/27/15 0456)  ketorolac (TORADOL) 30 MG/ML injection 30 mg (30 mg Intravenous Given 08/27/15 0518)   Following GI cocktail bowel sounds started moving through and  became hyperactive in the lower abdomen now belching and passing gas from bottom.  Abdomen is markedly flatter.    Given a breathing treatment and patient now states his breathing is markedly improved and he feels better and wants to go home  2 negative sets of troponins and a normal EKG.  HEART score is 0.  Symptoms consistent with bronchitis and now feels markedly improved post medication.  Will treat with albuterol MDI/ spacer, prednisone.  Strict return precautions given.  Follow up in the am with Dr. Virgina Jock.  Start miralax for constipation and adhere to a GERD friendly diet.  Patient and wife verbalize understanding of all instructions    Sanvika Cuttino, MD 08/27/15 [phone]

## 2015-08-29 DIAGNOSIS — K219 Gastro-esophageal reflux disease without esophagitis: Secondary | ICD-10-CM | POA: Diagnosis not present

## 2015-08-29 DIAGNOSIS — Z6830 Body mass index (BMI) 30.0-30.9, adult: Secondary | ICD-10-CM | POA: Diagnosis not present

## 2015-08-29 DIAGNOSIS — J209 Acute bronchitis, unspecified: Secondary | ICD-10-CM | POA: Diagnosis not present

## 2015-09-02 DIAGNOSIS — N401 Enlarged prostate with lower urinary tract symptoms: Secondary | ICD-10-CM | POA: Diagnosis not present

## 2015-09-02 DIAGNOSIS — N390 Urinary tract infection, site not specified: Secondary | ICD-10-CM | POA: Diagnosis not present

## 2015-09-02 DIAGNOSIS — N529 Male erectile dysfunction, unspecified: Secondary | ICD-10-CM | POA: Diagnosis not present

## 2015-09-02 DIAGNOSIS — N138 Other obstructive and reflux uropathy: Secondary | ICD-10-CM | POA: Diagnosis not present

## 2015-09-02 DIAGNOSIS — N200 Calculus of kidney: Secondary | ICD-10-CM | POA: Diagnosis not present

## 2015-09-11 DIAGNOSIS — Z0000 Encounter for general adult medical examination without abnormal findings: Secondary | ICD-10-CM | POA: Diagnosis not present

## 2015-09-11 DIAGNOSIS — R739 Hyperglycemia, unspecified: Secondary | ICD-10-CM | POA: Diagnosis not present

## 2015-09-11 DIAGNOSIS — M109 Gout, unspecified: Secondary | ICD-10-CM | POA: Diagnosis not present

## 2015-09-11 DIAGNOSIS — Z125 Encounter for screening for malignant neoplasm of prostate: Secondary | ICD-10-CM | POA: Diagnosis not present

## 2015-09-11 DIAGNOSIS — N390 Urinary tract infection, site not specified: Secondary | ICD-10-CM | POA: Diagnosis not present

## 2015-09-11 DIAGNOSIS — E785 Hyperlipidemia, unspecified: Secondary | ICD-10-CM | POA: Diagnosis not present

## 2015-09-23 ENCOUNTER — Encounter (HOSPITAL_BASED_OUTPATIENT_CLINIC_OR_DEPARTMENT_OTHER): Payer: Self-pay | Admitting: Adult Health

## 2015-09-23 ENCOUNTER — Emergency Department (HOSPITAL_BASED_OUTPATIENT_CLINIC_OR_DEPARTMENT_OTHER)
Admission: EM | Admit: 2015-09-23 | Discharge: 2015-09-23 | Disposition: A | Discharge status: Home / Self Care | Payer: Medicare Other | Attending: Emergency Medicine | Admitting: Emergency Medicine

## 2015-09-23 ENCOUNTER — Emergency Department (HOSPITAL_BASED_OUTPATIENT_CLINIC_OR_DEPARTMENT_OTHER): Payer: Medicare Other

## 2015-09-23 DIAGNOSIS — J209 Acute bronchitis, unspecified: Secondary | ICD-10-CM | POA: Diagnosis not present

## 2015-09-23 DIAGNOSIS — Z79899 Other long term (current) drug therapy: Secondary | ICD-10-CM | POA: Insufficient documentation

## 2015-09-23 DIAGNOSIS — R059 Cough, unspecified: Secondary | ICD-10-CM

## 2015-09-23 DIAGNOSIS — Z792 Long term (current) use of antibiotics: Secondary | ICD-10-CM | POA: Insufficient documentation

## 2015-09-23 DIAGNOSIS — J40 Bronchitis, not specified as acute or chronic: Secondary | ICD-10-CM

## 2015-09-23 DIAGNOSIS — R509 Fever, unspecified: Secondary | ICD-10-CM | POA: Diagnosis present

## 2015-09-23 DIAGNOSIS — R05 Cough: Secondary | ICD-10-CM

## 2015-09-23 DIAGNOSIS — Z87891 Personal history of nicotine dependence: Secondary | ICD-10-CM | POA: Insufficient documentation

## 2015-09-23 DIAGNOSIS — K219 Gastro-esophageal reflux disease without esophagitis: Secondary | ICD-10-CM | POA: Insufficient documentation

## 2015-09-23 DIAGNOSIS — Z87442 Personal history of urinary calculi: Secondary | ICD-10-CM | POA: Insufficient documentation

## 2015-09-23 LAB — CBC WITH DIFFERENTIAL/PLATELET
BASOS ABS: 0 10*3/uL (ref 0.0–0.1)
BASOS PCT: 0 %
Eosinophils Absolute: 0.1 10*3/uL (ref 0.0–0.7)
Eosinophils Relative: 1 %
HEMATOCRIT: 42 % (ref 39.0–52.0)
HEMOGLOBIN: 13.7 g/dL (ref 13.0–17.0)
LYMPHS PCT: 7 %
Lymphs Abs: 1.3 10*3/uL (ref 0.7–4.0)
MCH: 30.6 pg (ref 26.0–34.0)
MCHC: 32.6 g/dL (ref 30.0–36.0)
MCV: 93.8 fL (ref 78.0–100.0)
Monocytes Absolute: 1.6 10*3/uL — ABNORMAL HIGH (ref 0.1–1.0)
Monocytes Relative: 9 %
NEUTROS ABS: 15.3 10*3/uL — AB (ref 1.7–7.7)
NEUTROS PCT: 83 %
Platelets: 193 10*3/uL (ref 150–400)
RBC: 4.48 MIL/uL (ref 4.22–5.81)
RDW: 14 % (ref 11.5–15.5)
WBC: 18.3 10*3/uL — AB (ref 4.0–10.5)

## 2015-09-23 LAB — BASIC METABOLIC PANEL
ANION GAP: 7 (ref 5–15)
BUN: 14 mg/dL (ref 6–20)
CALCIUM: 8.8 mg/dL — AB (ref 8.9–10.3)
CHLORIDE: 104 mmol/L (ref 101–111)
CO2: 26 mmol/L (ref 22–32)
Creatinine, Ser: 0.92 mg/dL (ref 0.61–1.24)
GFR calc non Af Amer: >60 mL/min (ref >60)
GLUCOSE: 133 mg/dL — AB (ref 65–99)
POTASSIUM: 3.5 mmol/L (ref 3.5–5.1)
Sodium: 137 mmol/L (ref 135–145)

## 2015-09-23 LAB — I-STAT CG4 LACTIC ACID, ED: Lactic Acid, Venous: 0.93 mmol/L (ref 0.5–2.0)

## 2015-09-23 MED ORDER — AZITHROMYCIN 250 MG PO TABS: 250.0000 mg | ORAL_TABLET | Freq: Every day | ORAL | Status: DC

## 2015-09-23 MED ORDER — DOXYCYCLINE HYCLATE 50 MG PO CAPS: 50.0000 mg | ORAL_CAPSULE | Freq: Two times a day (BID) | ORAL | Status: DC

## 2015-09-23 MED ORDER — DOXYCYCLINE HYCLATE 100 MG PO CAPS: 100.0000 mg | ORAL_CAPSULE | Freq: Two times a day (BID) | ORAL | Status: DC

## 2015-09-23 NOTE — ED Provider Notes (Signed)
CSN: [id number]     Arrival date & time 09/23/15  2042 History  By signing my name below, I, Eddie Walker, attest that this documentation has been prepared under the direction and in the presence of Eddie Dandy, MD. Electronically Signed: Meriel Walker, ED Scribe. 09/23/2015. 10:44 PM.   Chief Complaint  Patient presents with  . Fever  . Shortness of Breath   The history is provided by the patient. No language interpreter was used.   HPI Comments: Eddie Walker is a 69 y.o. male, with a significant PMhx, who presents to the Emergency Department complaining of gradually worsening cough for one month. Reports onset of cough, congestion for one month, initially diagnosed as bronchitis. Tried inhaler and steroids without significant improvement of symptoms. Today noted he had fever of 101.48F at home and came to ED for evaluation. Denies chest pain or shortness of breath, but states when he lies down, his cough is worse. Denies sob with lying down. Played recent game of golf with ongoing symptoms, and no DOE/fatigue with this activity.  Pt denies DOE, CP, BLE edema, nasal congestion, vomiting, diarrhea, dysuria, urgency, or frequency. No PND. No PMhx of COPD or CHF. Pt has not been recently hospitalized or visited anyone in a hospital or nursing home setting.   Past Medical History  Diagnosis Date  . Hernia 01/2013  . Kidney stone 2008  . GERD (gastroesophageal reflux disease)   . H/O hiatal hernia    Past Surgical History  Procedure Laterality Date  . Orchiectomy Right 1971  . Knee arthroscopy Left 2008  . Hernia repair      left  . Teeth pulled  2010  . Extracorporeal shock wave lithotripsy Right 2014  . Cystoscopy with stent placement Left 04/16/2014    Procedure: CYSTOSCOPY WITH STENT PLACEMENT;  Surgeon: Irine Seal, MD;  Location: [facility];  Service: Urology;  Laterality: Left;   History reviewed. No pertinent family history. Social History  Substance Use Topics  . Smoking  status: Former Smoker -- 1.50 packs/day    Types: Cigarettes, Pipe, Cigars    Quit date: 11/28/2009  . Smokeless tobacco: None  . Alcohol Use: 0.6 oz/week    1 Glasses of wine per week    Review of Systems  Constitutional: Positive for fever.  HENT: Positive for sore throat. Negative for congestion.   Respiratory: Positive for cough and shortness of breath.   Cardiovascular: Negative for chest pain and leg swelling.  Gastrointestinal: Negative for nausea, vomiting and diarrhea.  Genitourinary: Negative for dysuria, urgency and frequency.  All other systems reviewed and are negative.  Allergies  Peanut-containing drug products and Novocain  Home Medications   Prior to Admission medications   Medication Sig Start Date End Date Taking? Authorizing Provider  acetaminophen (TYLENOL) 500 MG tablet Take 500 mg by mouth every 6 (six) hours as needed for mild pain.    Historical Provider, MD  albuterol (PROVENTIL HFA;VENTOLIN HFA) 108 (90 BASE) MCG/ACT inhaler Inhale 1-2 puffs into the lungs every 6 (six) hours as needed for wheezing or shortness of breath. 08/27/15   April Palumbo, MD  amoxicillin (AMOXIL) 500 MG tablet Take 1 tablet (500 mg total) by mouth 2 (two) times daily. 04/18/14   Rana Snare, MD  doxycycline (VIBRAMYCIN) 100 MG capsule Take 1 capsule (100 mg total) by mouth 2 (two) times daily. 09/23/15   Eddie Dandy, MD  dutasteride (AVODART) 0.5 MG capsule Take 0.5 mg by mouth daily.    Historical  Provider, MD  EPINEPHrine (EPIPEN) 0.3 mg/0.3 mL SOAJ injection Inject 0.3 mg into the muscle as needed (anaphylaxis).    Historical Provider, MD  glucosamine-chondroitin 500-400 MG tablet Take 1 tablet by mouth 2 (two) times daily.     Historical Provider, MD  Multiple Vitamin (MULTIVITAMIN WITH MINERALS) TABS Take 1 tablet by mouth daily.    Historical Provider, MD  omeprazole (PRILOSEC) 20 MG capsule Take 20 mg by mouth daily.    Historical Provider, MD  omeprazole (PRILOSEC) 20 MG  capsule Take 1 capsule (20 mg total) by mouth daily. 08/27/15   April Palumbo, MD  polyethylene glycol powder (MIRALAX) powder Take 17 g by mouth daily. 08/27/15   April Palumbo, MD  predniSONE (DELTASONE) 20 MG tablet 3 tabs po day one, then 2 po daily x 4 days 08/27/15   April Palumbo, MD  Spacer/Aero-Holding [name] (E-Z SPACER) inhaler Use as instructed 08/27/15   April Palumbo, MD   BP 153/74 mmHg  Pulse 108  Temp(Src) 99.8 F (37.7 C) (Oral)  Resp 20  Ht 6\' 1"  (1.854 m)  Wt 210 lb 5 oz (95.397 kg)  BMI 27.75 kg/m2  SpO2 93% Physical Exam Physical Exam  Nursing note and vitals reviewed. Constitutional: Well developed, well nourished, non-toxic, and in no acute distress Head: Normocephalic and atraumatic.  Mouth/Throat: Oropharynx is clear and moist.  Neck: Normal range of motion. Neck supple.  Cardiovascular: Normal rate and regular rhythm.  No edema. NO JVD Pulmonary/Chest: Effort normal and breath sounds normal.  Abdominal: Soft. There is no tenderness. There is no rebound and no guarding.  Musculoskeletal: Normal range of motion.  Neurological: Alert, no facial droop, fluent speech, moves all extremities symmetrically Skin: Skin is warm and dry.  Psychiatric: Cooperative  ED Course  Procedures  DIAGNOSTIC STUDIES: Oxygen Saturation is 93% on RA, adequate by my interpretation.    COORDINATION OF CARE: 9:15 PM Discussed treatment plan with pt at bedside and pt agreed to plan.   Labs Review Labs Reviewed  CBC WITH DIFFERENTIAL/PLATELET - Abnormal; Notable for the following:    WBC 18.3 (*)    Neutro Abs 15.3 (*)    Monocytes Absolute 1.6 (*)    All other components within normal limits  BASIC METABOLIC PANEL - Abnormal; Notable for the following:    Glucose, Bld 133 (*)    Calcium 8.8 (*)    All other components within normal limits  I-STAT CG4 LACTIC ACID, ED  I-STAT CG4 LACTIC ACID, ED    Imaging Review Dg Chest 2 View  09/23/2015   CLINICAL DATA:  Cough and  fever.  Fever for 1 month.  EXAM: CHEST  2 VIEW  COMPARISON:  08/27/2015  FINDINGS: The lungs are hyperinflated. The cardiomediastinal contours are normal. Minimal right basilar atelectasis. Pulmonary vasculature is normal. No consolidation, pleural effusion, or pneumothorax. No acute osseous abnormalities are seen.  IMPRESSION: Stable hyperinflation. Minimal right basilar atelectasis. No consolidation to suggest pneumonia.   Electronically Signed   By: Jeb Levering M.D.   On: 09/23/2015 22:04   I have personally reviewed and evaluated these images and lab results as part of my medical decision-making.   EKG Interpretation   Date/Time:  Tuesday September 23 2015 21:23:55 EDT Ventricular Rate:  106 PR Interval:  160 QRS Duration: 86 QT Interval:  344 QTC Calculation: 456 R Axis:   54 Text Interpretation:  Sinus tachycardia No signficant change from prior  EKG Confirmed by LIU MD, DANA [phone]) on 09/23/2015 9:24:15  PM      MDM   Final diagnoses:  Bronchitis  Cough    In short, this is a 69 year old male who presents with 1 month of persist cough and now fever. Although CC states SOB, he denies SOB and reports that it is his cough that is why he is here. Afebrile on arrival. Lungs are clear. NO CHF history and does not appear fluid overloaded. CXR showing no PNA and basic blood work showing normal lactate but leukocytosis which is new. No other source of infection by history of exam aside from respiratory. Given ongoing symptoms for 4 weeks, with treat or bacterial etiology of bronchitis. No ACS risk factors aside from age, and no reported sob or chest pain concerning for underlying ACS or even PE. Given course of doxycycline. He will see PCP in 2 days (as previously scheduled. Strict return instructions reviewed. He expressed understanding of all discharge instructions and felt comfortable with the plan of care.    I personally performed the services described in this documentation,  which was scribed in my presence. The recorded information has been reviewed and is accurate.    Eddie Dandy, MD 09/24/15 [phone]

## 2015-09-23 NOTE — Discharge Instructions (Signed)
Please take antibiotics as prescribed. Return for worsening symptoms, including persistent fevers despite antibiotics, difficulty breathing, chest pain, or any other symptoms concerning to you.   Cough, Adult  A cough is a reflex that helps clear your throat and airways. It can help heal the body or may be a reaction to an irritated airway. A cough may only last 2 or 3 weeks (acute) or may last more than 8 weeks (chronic).  CAUSES Acute cough:  Viral or bacterial infections. Chronic cough:  Infections.  Allergies.  Asthma.  Post-nasal drip.  Smoking.  Heartburn or acid reflux.  Some medicines.  Chronic lung problems (COPD).  Cancer. SYMPTOMS   Cough.  Fever.  Chest pain.  Increased breathing rate.  High-pitched whistling sound when breathing (wheezing).  Colored mucus that you cough up (sputum). TREATMENT   A bacterial cough may be treated with antibiotic medicine.  A viral cough must run its course and will not respond to antibiotics.  Your caregiver may recommend other treatments if you have a chronic cough. HOME CARE INSTRUCTIONS   Only take over-the-counter or prescription medicines for pain, discomfort, or fever as directed by your caregiver. Use cough suppressants only as directed by your caregiver.  Use a cold steam vaporizer or humidifier in your bedroom or home to help loosen secretions.  Sleep in a semi-upright position if your cough is worse at night.  Rest as needed.  Stop smoking if you smoke. SEEK IMMEDIATE MEDICAL CARE IF:   You have pus in your sputum.  Your cough starts to worsen.  You cannot control your cough with suppressants and are losing sleep.  You begin coughing up blood.  You have difficulty breathing.  You develop pain which is getting worse or is uncontrolled with medicine.  You have a fever. MAKE SURE YOU:   Understand these instructions.  Will watch your condition.  Will get help right away if you are not  doing well or get worse. Document Released: 06/11/2011 Document Revised: 03/06/2012 Document Reviewed: 06/11/2011 [facility] Patient Information 2015 [place], [place]. This information is not intended to replace advice given to you by your health care provider. Make sure you discuss any questions you have with your health care provider.

## 2015-09-23 NOTE — ED Notes (Signed)
Presents with one month of cough and general maliase-seen her a month ago and hasn ot gotten better, today fever of 102.0, cough and SOB. Breath sounds diminished bilaterally. No production to cough.

## 2015-09-25 DIAGNOSIS — Z6829 Body mass index (BMI) 29.0-29.9, adult: Secondary | ICD-10-CM | POA: Diagnosis not present

## 2015-09-25 DIAGNOSIS — R739 Hyperglycemia, unspecified: Secondary | ICD-10-CM | POA: Diagnosis not present

## 2015-09-25 DIAGNOSIS — E669 Obesity, unspecified: Secondary | ICD-10-CM | POA: Diagnosis not present

## 2015-09-25 DIAGNOSIS — Z1389 Encounter for screening for other disorder: Secondary | ICD-10-CM | POA: Diagnosis not present

## 2015-09-25 DIAGNOSIS — K760 Fatty (change of) liver, not elsewhere classified: Secondary | ICD-10-CM | POA: Diagnosis not present

## 2015-09-25 DIAGNOSIS — K469 Unspecified abdominal hernia without obstruction or gangrene: Secondary | ICD-10-CM | POA: Diagnosis not present

## 2015-09-25 DIAGNOSIS — E785 Hyperlipidemia, unspecified: Secondary | ICD-10-CM | POA: Diagnosis not present

## 2015-09-25 DIAGNOSIS — Z0000 Encounter for general adult medical examination without abnormal findings: Secondary | ICD-10-CM | POA: Diagnosis not present

## 2015-09-25 DIAGNOSIS — Z23 Encounter for immunization: Secondary | ICD-10-CM | POA: Diagnosis not present

## 2015-09-25 DIAGNOSIS — R972 Elevated prostate specific antigen [PSA]: Secondary | ICD-10-CM | POA: Diagnosis not present

## 2015-09-25 DIAGNOSIS — J449 Chronic obstructive pulmonary disease, unspecified: Secondary | ICD-10-CM | POA: Diagnosis not present

## 2015-09-25 DIAGNOSIS — J209 Acute bronchitis, unspecified: Secondary | ICD-10-CM | POA: Diagnosis not present

## 2015-10-08 DIAGNOSIS — Z1212 Encounter for screening for malignant neoplasm of rectum: Secondary | ICD-10-CM | POA: Diagnosis not present

## 2015-12-17 DIAGNOSIS — N390 Urinary tract infection, site not specified: Secondary | ICD-10-CM | POA: Diagnosis not present

## 2015-12-17 DIAGNOSIS — R300 Dysuria: Secondary | ICD-10-CM | POA: Diagnosis not present

## 2015-12-17 DIAGNOSIS — R8299 Other abnormal findings in urine: Secondary | ICD-10-CM | POA: Diagnosis not present

## 2016-04-01 DIAGNOSIS — Z23 Encounter for immunization: Secondary | ICD-10-CM | POA: Diagnosis not present

## 2016-04-01 DIAGNOSIS — R739 Hyperglycemia, unspecified: Secondary | ICD-10-CM | POA: Diagnosis not present

## 2016-04-01 DIAGNOSIS — J449 Chronic obstructive pulmonary disease, unspecified: Secondary | ICD-10-CM | POA: Diagnosis not present

## 2016-04-01 DIAGNOSIS — E668 Other obesity: Secondary | ICD-10-CM | POA: Diagnosis not present

## 2016-04-01 DIAGNOSIS — M170 Bilateral primary osteoarthritis of knee: Secondary | ICD-10-CM | POA: Diagnosis not present

## 2016-04-01 DIAGNOSIS — Z6830 Body mass index (BMI) 30.0-30.9, adult: Secondary | ICD-10-CM | POA: Diagnosis not present

## 2016-04-01 DIAGNOSIS — E784 Other hyperlipidemia: Secondary | ICD-10-CM | POA: Diagnosis not present

## 2016-04-05 ENCOUNTER — Emergency Department (HOSPITAL_BASED_OUTPATIENT_CLINIC_OR_DEPARTMENT_OTHER): Payer: Medicare Other

## 2016-04-05 ENCOUNTER — Inpatient Hospital Stay (HOSPITAL_BASED_OUTPATIENT_CLINIC_OR_DEPARTMENT_OTHER)
Admission: EM | Admit: 2016-04-05 | Discharge: 2016-04-07 | DRG: 689 | Disposition: A | Discharge status: Home / Self Care | Payer: Medicare Other | Attending: Internal Medicine | Admitting: Internal Medicine

## 2016-04-05 ENCOUNTER — Inpatient Hospital Stay (HOSPITAL_COMMUNITY): Payer: Medicare Other | Admitting: Anesthesiology

## 2016-04-05 ENCOUNTER — Encounter (HOSPITAL_BASED_OUTPATIENT_CLINIC_OR_DEPARTMENT_OTHER): Payer: Self-pay | Admitting: *Deleted

## 2016-04-05 ENCOUNTER — Encounter (HOSPITAL_COMMUNITY)
Admission: EM | Disposition: A | Discharge status: Home / Self Care | Payer: Self-pay | Source: Home / Self Care | Attending: Internal Medicine

## 2016-04-05 DIAGNOSIS — R030 Elevated blood-pressure reading, without diagnosis of hypertension: Secondary | ICD-10-CM

## 2016-04-05 DIAGNOSIS — N1330 Unspecified hydronephrosis: Secondary | ICD-10-CM | POA: Diagnosis not present

## 2016-04-05 DIAGNOSIS — I10 Essential (primary) hypertension: Secondary | ICD-10-CM | POA: Diagnosis present

## 2016-04-05 DIAGNOSIS — Z87891 Personal history of nicotine dependence: Secondary | ICD-10-CM | POA: Diagnosis not present

## 2016-04-05 DIAGNOSIS — N136 Pyonephrosis: Principal | ICD-10-CM | POA: Diagnosis present

## 2016-04-05 DIAGNOSIS — N179 Acute kidney failure, unspecified: Secondary | ICD-10-CM | POA: Diagnosis present

## 2016-04-05 DIAGNOSIS — N132 Hydronephrosis with renal and ureteral calculous obstruction: Secondary | ICD-10-CM

## 2016-04-05 DIAGNOSIS — N200 Calculus of kidney: Secondary | ICD-10-CM | POA: Diagnosis not present

## 2016-04-05 DIAGNOSIS — R509 Fever, unspecified: Secondary | ICD-10-CM | POA: Diagnosis not present

## 2016-04-05 DIAGNOSIS — J9600 Acute respiratory failure, unspecified whether with hypoxia or hypercapnia: Secondary | ICD-10-CM | POA: Diagnosis present

## 2016-04-05 DIAGNOSIS — N201 Calculus of ureter: Secondary | ICD-10-CM | POA: Diagnosis not present

## 2016-04-05 DIAGNOSIS — K219 Gastro-esophageal reflux disease without esophagitis: Secondary | ICD-10-CM | POA: Diagnosis present

## 2016-04-05 DIAGNOSIS — IMO0001 Reserved for inherently not codable concepts without codable children: Secondary | ICD-10-CM | POA: Diagnosis present

## 2016-04-05 DIAGNOSIS — N202 Calculus of kidney with calculus of ureter: Secondary | ICD-10-CM | POA: Diagnosis present

## 2016-04-05 DIAGNOSIS — N12 Tubulo-interstitial nephritis, not specified as acute or chronic: Secondary | ICD-10-CM | POA: Diagnosis not present

## 2016-04-05 DIAGNOSIS — Z87442 Personal history of urinary calculi: Secondary | ICD-10-CM | POA: Diagnosis not present

## 2016-04-05 DIAGNOSIS — N390 Urinary tract infection, site not specified: Secondary | ICD-10-CM | POA: Insufficient documentation

## 2016-04-05 HISTORY — PX: CYSTOSCOPY WITH RETROGRADE PYELOGRAM, URETEROSCOPY AND STENT PLACEMENT: SHX5789

## 2016-04-05 LAB — COMPREHENSIVE METABOLIC PANEL
ALT: 23 U/L (ref 17–63)
AST: 22 U/L (ref 15–41)
Albumin: 3.6 g/dL (ref 3.5–5.0)
Alkaline Phosphatase: 72 U/L (ref 38–126)
Anion gap: 7 (ref 5–15)
BILIRUBIN TOTAL: 1.1 mg/dL (ref 0.3–1.2)
BUN: 26 mg/dL — AB (ref 6–20)
CALCIUM: 8.8 mg/dL — AB (ref 8.9–10.3)
CO2: 22 mmol/L (ref 22–32)
CREATININE: 1.43 mg/dL — AB (ref 0.61–1.24)
Chloride: 108 mmol/L (ref 101–111)
GFR, EST AFRICAN AMERICAN: 56 mL/min — AB (ref >60)
GFR, EST NON AFRICAN AMERICAN: 48 mL/min — AB (ref >60)
Glucose, Bld: 184 mg/dL — ABNORMAL HIGH (ref 65–99)
Potassium: 3.4 mmol/L — ABNORMAL LOW (ref 3.5–5.1)
Sodium: 137 mmol/L (ref 135–145)
TOTAL PROTEIN: 7.3 g/dL (ref 6.5–8.1)

## 2016-04-05 LAB — CBC WITH DIFFERENTIAL/PLATELET
BASOS ABS: 0 10*3/uL (ref 0.0–0.1)
BASOS PCT: 0 %
EOS ABS: 0 10*3/uL (ref 0.0–0.7)
EOS PCT: 0 %
HCT: 42.6 % (ref 39.0–52.0)
Hemoglobin: 14.1 g/dL (ref 13.0–17.0)
LYMPHS ABS: 0.4 10*3/uL — AB (ref 0.7–4.0)
Lymphocytes Relative: 2 %
MCH: 30.1 pg (ref 26.0–34.0)
MCHC: 33.1 g/dL (ref 30.0–36.0)
MCV: 91 fL (ref 78.0–100.0)
Monocytes Absolute: 0.7 10*3/uL (ref 0.1–1.0)
Monocytes Relative: 4 %
Neutro Abs: 15.8 10*3/uL — ABNORMAL HIGH (ref 1.7–7.7)
Neutrophils Relative %: 94 %
PLATELETS: 162 10*3/uL (ref 150–400)
RBC: 4.68 MIL/uL (ref 4.22–5.81)
RDW: 14.6 % (ref 11.5–15.5)
WBC: 16.9 10*3/uL — AB (ref 4.0–10.5)

## 2016-04-05 LAB — URINALYSIS, ROUTINE W REFLEX MICROSCOPIC
BILIRUBIN URINE: NEGATIVE
Glucose, UA: NEGATIVE mg/dL
Ketones, ur: 15 mg/dL — AB
NITRITE: POSITIVE — AB
PH: 6.5 (ref 5.0–8.0)
Protein, ur: 100 mg/dL — AB
SPECIFIC GRAVITY, URINE: 1.025 (ref 1.005–1.030)

## 2016-04-05 LAB — I-STAT CG4 LACTIC ACID, ED
LACTIC ACID, VENOUS: 1.16 mmol/L (ref 0.5–2.0)
Lactic Acid, Venous: 1.46 mmol/L (ref 0.5–2.0)

## 2016-04-05 LAB — URINE MICROSCOPIC-ADD ON

## 2016-04-05 SURGERY — CYSTOURETEROSCOPY, WITH RETROGRADE PYELOGRAM AND STENT INSERTION
Anesthesia: General | Site: Ureter | Laterality: Right

## 2016-04-05 MED ORDER — SODIUM CHLORIDE 0.9 % IV BOLUS (SEPSIS)
1000.0000 mL | INTRAVENOUS | Status: AC
  Administered 2016-04-05 (×3): 1000 mL via INTRAVENOUS

## 2016-04-05 MED ORDER — ONDANSETRON HCL 4 MG/2ML IJ SOLN
INTRAMUSCULAR | Status: AC
  Filled 2016-04-05: qty 2

## 2016-04-05 MED ORDER — LIDOCAINE HCL (CARDIAC) 20 MG/ML IV SOLN
INTRAVENOUS | Status: AC
  Filled 2016-04-05: qty 5

## 2016-04-05 MED ORDER — ONDANSETRON HCL 4 MG/2ML IJ SOLN
INTRAMUSCULAR | Status: DC | PRN
  Administered 2016-04-05: 4 mg via INTRAVENOUS

## 2016-04-05 MED ORDER — ACETAMINOPHEN 650 MG RE SUPP: 650.0000 mg | Freq: Four times a day (QID) | RECTAL | Status: DC | PRN

## 2016-04-05 MED ORDER — OXYCODONE HCL 5 MG PO TABS: 5.0000 mg | ORAL_TABLET | ORAL | Status: DC | PRN

## 2016-04-05 MED ORDER — IOPAMIDOL (ISOVUE-300) INJECTION 61%
INTRAVENOUS | Status: AC
  Filled 2016-04-05: qty 50

## 2016-04-05 MED ORDER — PROPOFOL 10 MG/ML IV BOLUS
INTRAVENOUS | Status: DC | PRN
  Administered 2016-04-05: 180 mg via INTRAVENOUS

## 2016-04-05 MED ORDER — ACETAMINOPHEN 500 MG PO TABS
1000.0000 mg | ORAL_TABLET | Freq: Once | ORAL | Status: AC
  Administered 2016-04-05: 1000 mg via ORAL
  Filled 2016-04-05: qty 2

## 2016-04-05 MED ORDER — HYDROMORPHONE HCL 1 MG/ML IJ SOLN: 0.5000 mg | INTRAMUSCULAR | Status: DC | PRN

## 2016-04-05 MED ORDER — DEXTROSE 5 % IV SOLN
1.0000 g | Freq: Once | INTRAVENOUS | Status: AC
  Administered 2016-04-05: 1 g via INTRAVENOUS
  Filled 2016-04-05: qty 10

## 2016-04-05 MED ORDER — AMLODIPINE BESYLATE 5 MG PO TABS
5.0000 mg | ORAL_TABLET | Freq: Once | ORAL | Status: AC
  Administered 2016-04-05: 5 mg via ORAL
  Filled 2016-04-05: qty 1

## 2016-04-05 MED ORDER — PROPOFOL 10 MG/ML IV BOLUS
INTRAVENOUS | Status: AC
  Filled 2016-04-05: qty 20

## 2016-04-05 MED ORDER — SODIUM CHLORIDE 0.45 % IV SOLN
INTRAVENOUS | Status: AC
  Administered 2016-04-06: 01:00:00 via INTRAVENOUS

## 2016-04-05 MED ORDER — ACETAMINOPHEN 325 MG PO TABS
650.0000 mg | ORAL_TABLET | Freq: Four times a day (QID) | ORAL | Status: DC | PRN
  Administered 2016-04-06 – 2016-04-07 (×4): 650 mg via ORAL
  Filled 2016-04-05 (×4): qty 2

## 2016-04-05 MED ORDER — ONDANSETRON HCL 4 MG/2ML IJ SOLN: 4.0000 mg | Freq: Four times a day (QID) | INTRAMUSCULAR | Status: DC | PRN

## 2016-04-05 MED ORDER — ONDANSETRON HCL 4 MG PO TABS: 4.0000 mg | ORAL_TABLET | Freq: Four times a day (QID) | ORAL | Status: DC | PRN

## 2016-04-05 MED ORDER — FENTANYL CITRATE (PF) 100 MCG/2ML IJ SOLN
INTRAMUSCULAR | Status: AC
  Filled 2016-04-05: qty 2

## 2016-04-05 MED ORDER — MIDAZOLAM HCL 2 MG/2ML IJ SOLN
INTRAMUSCULAR | Status: AC
  Filled 2016-04-05: qty 2

## 2016-04-05 MED ORDER — FENTANYL CITRATE (PF) 100 MCG/2ML IJ SOLN
INTRAMUSCULAR | Status: DC | PRN
  Administered 2016-04-05: 100 ug via INTRAVENOUS

## 2016-04-05 MED ORDER — SODIUM CHLORIDE 0.9 % IV SOLN: INTRAVENOUS | Status: DC

## 2016-04-05 MED ORDER — LACTATED RINGERS IV SOLN
INTRAVENOUS | Status: DC | PRN
  Administered 2016-04-05: 23:00:00 via INTRAVENOUS

## 2016-04-05 MED ORDER — IOPAMIDOL (ISOVUE-300) INJECTION 61%
INTRAVENOUS | Status: DC | PRN
  Administered 2016-04-05: 10 mL via INTRAVENOUS

## 2016-04-05 MED ORDER — SODIUM CHLORIDE 0.9 % IR SOLN
Status: DC | PRN
  Administered 2016-04-05: 3000 mL

## 2016-04-05 SURGICAL SUPPLY — 23 items
BAG URO CATCHER STRL LF (MISCELLANEOUS) ×3 IMPLANT
BASKET LASER NITINOL 1.9FR (BASKET) ×3 IMPLANT
BASKET ZERO TIP NITINOL 2.4FR (BASKET) IMPLANT
BSKT STON RTRVL 120 1.9FR (BASKET) ×1
BSKT STON RTRVL ZERO TP 2.4FR (BASKET)
CATH INTERMIT  6FR 70CM (CATHETERS) ×2 IMPLANT
CLOTH BEACON ORANGE TIMEOUT ST (SAFETY) ×3 IMPLANT
FIBER LASER FLEXIVA 200 (UROLOGICAL SUPPLIES) IMPLANT
FIBER LASER FLEXIVA 365 (UROLOGICAL SUPPLIES) IMPLANT
FIBER LASER TRAC TIP (UROLOGICAL SUPPLIES) IMPLANT
GLOVE BIOGEL M 8.0 STRL (GLOVE) ×3 IMPLANT
GOWN STRL REUS W/ TWL XL LVL3 (GOWN DISPOSABLE) ×1 IMPLANT
GOWN STRL REUS W/TWL LRG LVL3 (GOWN DISPOSABLE) ×6 IMPLANT
GOWN STRL REUS W/TWL XL LVL3 (GOWN DISPOSABLE) ×3
GUIDEWIRE ANG ZIPWIRE 038X150 (WIRE) ×3 IMPLANT
GUIDEWIRE STR DUAL SENSOR (WIRE) ×3 IMPLANT
IV NS 1000ML (IV SOLUTION) ×3
IV NS 1000ML BAXH (IV SOLUTION) ×1 IMPLANT
MANIFOLD NEPTUNE II (INSTRUMENTS) ×3 IMPLANT
PACK CYSTO (CUSTOM PROCEDURE TRAY) ×3 IMPLANT
STENT URET 6FRX24 CONTOUR (STENTS) ×2 IMPLANT
TUBING CONNECTING 10 (TUBING) ×2 IMPLANT
TUBING CONNECTING 10' (TUBING) ×1

## 2016-04-05 NOTE — ED Notes (Signed)
MD at bedside. 

## 2016-04-05 NOTE — Anesthesia Preprocedure Evaluation (Signed)
Anesthesia Evaluation  Patient identified by MRN, date of birth, ID band Patient awake    Reviewed: Allergy & Precautions, H&P , Patient's Chart, lab work & pertinent test results, reviewed documented beta blocker date and time   Airway Mallampati: II  TM Distance: >3 FB Neck ROM: full    Dental no notable dental hx.    Pulmonary former smoker,    Pulmonary exam normal breath sounds clear to auscultation       Cardiovascular  Rhythm:regular Rate:Normal     Neuro/Psych    GI/Hepatic   Endo/Other    Renal/GU      Musculoskeletal   Abdominal   Peds  Hematology   Anesthesia Other Findings Feels well except kidney stone sx No inhaler; breathing well   Reproductive/Obstetrics                             Anesthesia Physical Anesthesia Plan  ASA: II and emergent  Anesthesia Plan:    Post-op Pain Management:    Induction: Intravenous  Airway Management Planned: LMA  Additional Equipment:   Intra-op Plan:   Post-operative Plan:   Informed Consent: I have reviewed the patients History and Physical, chart, labs and discussed the procedure including the risks, benefits and alternatives for the proposed anesthesia with the patient or authorized representative who has indicated his/her understanding and acceptance.   Dental Advisory Given and Dental advisory given  Plan Discussed with: CRNA and Surgeon  Anesthesia Plan Comments: (Discussed GA with LMA, possible sore throat, potential need to switch to ETT, N/V, pulmonary aspiration. Questions answered. )        Anesthesia Quick Evaluation

## 2016-04-05 NOTE — Consult Note (Signed)
Urology Consult  Consulting MD:J Tomi Bamberger, MD  CC: Kidney stone, fever  HPI: This is a 70 year old male With a prior history of urolithiasis, last taken care of by Dr. Rana Snare or a left-sided stone. The [facility] with a fever 103 and right flank pain as well as nausea and vomiting. Evaluation revealed the patient had a 10 mm right proximal ureteral stone with hydronephrosis, as well as probable UTI. He is transferred to [facility] for management of this obstructing stone with fever.  PMH: Past Medical History  Diagnosis Date  . Hernia 01/2013  . Kidney stone 2008  . GERD (gastroesophageal reflux disease)   . H/O hiatal hernia     PSH: Past Surgical History  Procedure Laterality Date  . Orchiectomy Right 1971  . Knee arthroscopy Left 2008  . Hernia repair      left  . Teeth pulled  2010  . Extracorporeal shock wave lithotripsy Right 2014  . Cystoscopy with stent placement Left 04/16/2014    Procedure: CYSTOSCOPY WITH STENT PLACEMENT;  Surgeon: Irine Seal, MD;  Location: [facility];  Service: Urology;  Laterality: Left;    Allergies: Allergies  Allergen Reactions  . Peanut-Containing Drug Products Anaphylaxis    REACTION: Patient has Epipen to treat this- anaphylaxis shock  . Novocain [Procaine Hcl] Itching and Rash    Low grade fever    Medications: Prescriptions prior to admission  Medication Sig Dispense Refill Last Dose  . acetaminophen (TYLENOL) 500 MG tablet Take 500 mg by mouth every 6 (six) hours as needed for mild pain.   Past Week at Unknown time  . albuterol (PROVENTIL HFA;VENTOLIN HFA) 108 (90 BASE) MCG/ACT inhaler Inhale 1-2 puffs into the lungs every 6 (six) hours as needed for wheezing or shortness of breath. 1 Inhaler 0   . dutasteride (AVODART) 0.5 MG capsule Take 0.5 mg by mouth daily.   04/28/2014 at Unknown time  . EPINEPHrine (EPIPEN) 0.3 mg/0.3 mL SOAJ injection Inject 0.3 mg into the muscle as needed  (anaphylaxis).   never  . glucosamine-chondroitin 500-400 MG tablet Take 1 tablet by mouth 2 (two) times daily.    04/28/2014 at Unknown time  . Multiple Vitamin (MULTIVITAMIN WITH MINERALS) TABS Take 1 tablet by mouth daily.   04/28/2014 at Unknown time  . omeprazole (PRILOSEC) 20 MG capsule Take 20 mg by mouth daily.   04/28/2014 at Unknown time  . omeprazole (PRILOSEC) 20 MG capsule Take 1 capsule (20 mg total) by mouth daily. 30 capsule 0   . polyethylene glycol powder (MIRALAX) powder Take 17 g by mouth daily. 255 g 0   . Spacer/Aero-Holding Chambers (E-Z SPACER) inhaler Use as instructed 1 each 2      Social History: Social History   Social History  . Marital Status: Married    Spouse Name: N/A  . Number of Children: N/A  . Years of Education: N/A   Occupational History  . Not on file.   Social History Main Topics  . Smoking status: Former Smoker -- 1.50 packs/day    Types: Cigarettes, Pipe, Cigars    Quit date: 11/28/2009  . Smokeless tobacco: Not on file  . Alcohol Use: 0.6 oz/week    1 Glasses of wine per week  . Drug Use: No  . Sexual Activity: No   Other Topics Concern  . Not on file   Social History Narrative    Family History: History reviewed. No  pertinent family history.  Review of Systems: Positive: Nausea, vomiting, flank pain, fever Negative: No chest pain.  A further 10 point review of systems was negative except what is listed in the HPI.  Physical Exam: @VITALS2 @ General: No acute distress.  Awake. Head:  Normocephalic.  Atraumatic. ENT:  EOMI.  Mucous membranes moist Neck:  Supple.  No lymphadenopathy. CV:  S1 present. S2 present. Regular rate. Pulmonary: Equal effort bilaterally.  Clear to auscultation bilaterally. Abdomen: Soft. Mild right lower quadrant tenderness to palpation.Mild right CVA tenderness Skin:  Normal turgor.  No visible rash. Extremity: No gross deformity of bilateral upper extremities.  No gross deformity of    bilateral lower  extremities. Neurologic: Alert. Appropriate mood.    Studies:  Recent Labs     04/05/16  1557  HGB  14.1  WBC  16.9*  PLT  162    Recent Labs     04/05/16  1557  NA  137  K  3.4*  CL  108  CO2  22  BUN  26*  CREATININE  1.43*  CALCIUM  8.8*  GFRNONAA  48*  GFRAA  56*     No results for input(s): INR, APTT in the last 72 hours.  Invalid input(s): PT   Invalid input(s): ABG  CT scan images were reviewed as well as the above laboratories.  Assessment:  10 mm right proximal ureteral stone with fever, Infection and hydronephrosis  Plan: I discussed management with the patient and his wife. I would suggest urgent cystoscopy and double-J stent placement. The patient has received preoperative IV antibiotics. We will proceed with this  Urgently.    Pager:[phone]

## 2016-04-05 NOTE — ED Notes (Signed)
FIRST BOLUS COMPLETE.

## 2016-04-05 NOTE — ED Notes (Signed)
Patient transported to and from radiology department via stretcher. 

## 2016-04-05 NOTE — Transfer of Care (Signed)
Immediate Anesthesia Transfer of Care Note  Patient: Eddie Walker  Procedure(s) Performed: Procedure(s): CYSTOSCOPY WITH RETROGRADE PYELOGRAM, URETEROSCOPY AND STENT PLACEMENT (Right)  Patient Location: PACU  Anesthesia Type:General  Level of Consciousness: awake, alert  and oriented  Airway & Oxygen Therapy: Patient Spontanous Breathing and Patient connected to face mask oxygen  Post-op Assessment: Report given to RN and Post -op Vital signs reviewed and stable  Post vital signs: Reviewed and stable  Last Vitals:  Filed Vitals:   04/05/16 2134 04/05/16 2227  BP: 142/101 150/90  Pulse: 93 92  Temp:  37.3 C  Resp: 20 18    Complications: No apparent anesthesia complications

## 2016-04-05 NOTE — ED Notes (Signed)
Returned from CT with tech via Biomedical scientist

## 2016-04-05 NOTE — ED Notes (Signed)
Possible kidney stone per pt. Right flank pain since yesterday.

## 2016-04-05 NOTE — ED Notes (Signed)
Patient returned from xray.

## 2016-04-05 NOTE — ED Notes (Signed)
Report given to [facility]  

## 2016-04-05 NOTE — Op Note (Signed)
Preoperative diagnosis: Infected, obstructing right ureteropelvic junction stone  Postoperative diagnosis: Same  Procedure: Cystoscopy, right retrograde ureteropyelogram with interpretive fluoroscopy, right double-J stent placement-24 cm x 6 French contour stent without tether  Surgeon: Steel Kerney  Anesthesia: Gen.  Complications: None  Estimated blood loss: None  Drains: 24 cm x 6 French contour double-J stent, without tether  Indications: 70 year old male admitted this evening from [facility] with a 10 mm right UPJ stone, associated fever and infected urine. Due to the combination of these factors, urgent decompression of his right renal unit was recommended with double-J stent placement passed under anesthesia. The procedure as well as eventual further management was discussed with the patient and his wife, who attended him at the admission. They understand these and desire to proceed.  Findings: Normal bladder, minimally obstructive prostate, bilateral ureteral orifices normal, no evident bladder abnormalities. Normal right ureter with filling defect in the proximal ureter with proximal pyelocaliectasis, although this was incompletely visualized due to incomplete passage of contrast into the system.  Description of procedure: Patient was identified and properly marked in the holding area. He was taken to the operating room where general anesthetic was administered. He was placed in the dorsolithotomy position. Genitalia and perineum were prepped and draped. Proper timeout was performed.  A 23 French panendoscope was advanced under direct vision through the patient's urethra using the 30 lens. Urethra was without lesions. The prostate was minimally obstructive. The bladder was inspected circumferentially. No trabeculations foreign bodies or tumors noted. The right ureteral orifice was cannulated with a 6 [place] open-ended catheter. Using Isovue, a right retrograde ureteropyelogram  was performed. The above findings were noted.  Once the pyelogram was complete, I negotiated a 0.038 inch sensor-tip guidewire through the open-ended catheter, easily passed the stone and the upper pole calyceal system. The guidewire was left in, the open-end catheter removed. I then passed a 24 cm x 6 [place] contour double-J stent over top of the guidewire, with excellent positioning seen with the proximal curl me renal pelvis and the distal curl in the bladder once a guidewire was removed. The bladder was then drained. The procedure was then terminated following removal of the scope. He was then awakened and taken to the PACU in stable condition.

## 2016-04-05 NOTE — ED Provider Notes (Addendum)
CSN: [id number]     Arrival date & time 04/05/16  1504 History  By signing my name below, I, Doran Stabler, attest that this documentation has been prepared under the direction and in the presence of Dorie Rank, MD. Electronically Signed: Doran Stabler, ED Scribe. 04/05/2016. 3:48 PM.  Chief Complaint  Patient presents with  . Back Pain   The history is provided by the patient. No language interpreter was used.   HPI Comments: Eddie Walker is a 70 y.o. male who presents to the Emergency Department with a PMhx of kidney stones, hiatal hernia and GERD complaining of right flank pain that began yesterday. Pt also reports vomiting and diarrhea. Pt denies any urinary frequency, dysuria, urine discoloration, cough, abd pain, rash or any other symptoms at this time.  Past Medical History  Diagnosis Date  . Hernia 01/2013  . Kidney stone 2008  . GERD (gastroesophageal reflux disease)   . H/O hiatal hernia    Past Surgical History  Procedure Laterality Date  . Orchiectomy Right 1971  . Knee arthroscopy Left 2008  . Hernia repair      left  . Teeth pulled  2010  . Extracorporeal shock wave lithotripsy Right 2014  . Cystoscopy with stent placement Left 04/16/2014    Procedure: CYSTOSCOPY WITH STENT PLACEMENT;  Surgeon: Irine Seal, MD;  Location: [facility];  Service: Urology;  Laterality: Left;   No family history on file. Social History  Substance Use Topics  . Smoking status: Former Smoker -- 1.50 packs/day    Types: Cigarettes, Pipe, Cigars    Quit date: 11/28/2009  . Smokeless tobacco: None  . Alcohol Use: 0.6 oz/week    1 Glasses of wine per week    Review of Systems A complete 10 system review of systems was obtained and all systems are negative except as noted in the HPI and PMH.   Allergies  Peanut-containing drug products and Novocain  Home Medications   Prior to Admission medications   Medication Sig Start Date End Date Taking? Authorizing Provider  acetaminophen  (TYLENOL) 500 MG tablet Take 500 mg by mouth every 6 (six) hours as needed for mild pain.    Historical Provider, MD  albuterol (PROVENTIL HFA;VENTOLIN HFA) 108 (90 BASE) MCG/ACT inhaler Inhale 1-2 puffs into the lungs every 6 (six) hours as needed for wheezing or shortness of breath. 08/27/15   April Palumbo, MD  amoxicillin (AMOXIL) 500 MG tablet Take 1 tablet (500 mg total) by mouth 2 (two) times daily. 04/18/14   Rana Snare, MD  doxycycline (VIBRAMYCIN) 100 MG capsule Take 1 capsule (100 mg total) by mouth 2 (two) times daily. 09/23/15   Forde Dandy, MD  dutasteride (AVODART) 0.5 MG capsule Take 0.5 mg by mouth daily.    Historical Provider, MD  EPINEPHrine (EPIPEN) 0.3 mg/0.3 mL SOAJ injection Inject 0.3 mg into the muscle as needed (anaphylaxis).    Historical Provider, MD  glucosamine-chondroitin 500-400 MG tablet Take 1 tablet by mouth 2 (two) times daily.     Historical Provider, MD  Multiple Vitamin (MULTIVITAMIN WITH MINERALS) TABS Take 1 tablet by mouth daily.    Historical Provider, MD  omeprazole (PRILOSEC) 20 MG capsule Take 20 mg by mouth daily.    Historical Provider, MD  omeprazole (PRILOSEC) 20 MG capsule Take 1 capsule (20 mg total) by mouth daily. 08/27/15   April Palumbo, MD  polyethylene glycol powder (MIRALAX) powder Take 17 g by mouth daily. 08/27/15   Veatrice Kells, MD  predniSONE (DELTASONE) 20 MG tablet 3 tabs po day one, then 2 po daily x 4 days 08/27/15   April Palumbo, MD  Spacer/Aero-Holding [name] (E-Z SPACER) inhaler Use as instructed 08/27/15   April Palumbo, MD   BP 172/113 mmHg  Pulse 102  Temp(Src) 99.4 F (37.4 C) (Oral)  Resp 22  Ht 6\' 1"  (1.854 m)  Wt 97.977 kg  BMI 28.50 kg/m2  SpO2 95%   Physical Exam  Constitutional: He appears well-developed and well-nourished. No distress.  HENT:  Head: Normocephalic and atraumatic.  Right Ear: External ear normal.  Left Ear: External ear normal.  Mouth/Throat: No oropharyngeal exudate.  Eyes: Conjunctivae  are normal. Right eye exhibits no discharge. Left eye exhibits no discharge. No scleral icterus.  Neck: Neck supple. No tracheal deviation present.  Cardiovascular: Regular rhythm and intact distal pulses.  Tachycardia present.   Pulmonary/Chest: Effort normal and breath sounds normal. No stridor. No respiratory distress. He has no wheezes. He has no rales.  Abdominal: Soft. Bowel sounds are normal. He exhibits no distension. There is no tenderness. There is CVA tenderness (right). There is no rebound and no guarding.  Musculoskeletal: He exhibits no edema or tenderness.  Neurological: He is alert. He has normal strength. No cranial nerve deficit (no facial droop, extraocular movements intact, no slurred speech) or sensory deficit. He exhibits normal muscle tone. He displays no seizure activity. Coordination normal.  Skin: Skin is warm and dry. No rash noted.  Psychiatric: He has a normal mood and affect.  Nursing note and vitals reviewed.   ED Course  Procedures   DIAGNOSTIC STUDIES: Oxygen Saturation is 92% on room air, normal by my interpretation.    COORDINATION OF CARE: 3:38 PM Will give Tylenol. Will order urinalysis. Discussed treatment plan with pt at bedside and pt agreed to plan.  Labs Review Labs Reviewed  URINALYSIS, ROUTINE W REFLEX MICROSCOPIC (NOT AT [facility]) - Abnormal; Notable for the following:    APPearance CLOUDY (*)    Hgb urine dipstick LARGE (*)    Ketones, ur 15 (*)    Protein, ur 100 (*)    Nitrite POSITIVE (*)    Leukocytes, UA LARGE (*)    All other components within normal limits  COMPREHENSIVE METABOLIC PANEL - Abnormal; Notable for the following:    Potassium 3.4 (*)    Glucose, Bld 184 (*)    BUN 26 (*)    Creatinine, Ser 1.43 (*)    Calcium 8.8 (*)    GFR calc non Af Amer 48 (*)    GFR calc Af Amer 56 (*)    All other components within normal limits  CBC WITH DIFFERENTIAL/PLATELET - Abnormal; Notable for the following:    WBC 16.9 (*)    Neutro  Abs 15.8 (*)    Lymphs Abs 0.4 (*)    All other components within normal limits  URINE MICROSCOPIC-ADD ON - Abnormal; Notable for the following:    Squamous Epithelial / LPF 0-5 (*)    Bacteria, UA MANY (*)    All other components within normal limits  CULTURE, BLOOD (ROUTINE X 2)  CULTURE, BLOOD (ROUTINE X 2)  URINE CULTURE  I-STAT CG4 LACTIC ACID, ED  I-STAT CG4 LACTIC ACID, ED    Imaging Review Ct Abdomen Pelvis Wo Contrast  04/05/2016  CLINICAL DATA:  Right flank pain since Sunday.  Fever. EXAM: CT ABDOMEN AND PELVIS WITHOUT CONTRAST TECHNIQUE: Multidetector CT imaging of the abdomen and pelvis was performed following the standard  protocol without IV contrast. COMPARISON:  04/23/2014 FINDINGS: Lower chest: Small right pleural effusion and overlying atelectasis. The left lung base is clear. The heart is normal in size. No pericardial effusion. The distal esophagus is grossly normal. Hepatobiliary: Simple hepatic cyst. No worrisome hepatic lesions or intrahepatic biliary dilatation. The gallbladder is grossly normal. Pancreas: No mass, inflammation or ductal dilatation. Spleen: Normal size.  No focal lesions. Adrenals/Urinary Tract: Stable moderate nodularity of both adrenal glands, likely benign adenomas. Moderate-to-marked right-sided hydronephrosis due to a large obstructing UPJ calculus measuring 10 x 10 mm. Smaller renal calculi are noted. Perinephric fluid surrounding the right kidney in tracking down along the right ureter and into the extraperitoneal pelvis consistent with high-grade obstruction. Multiple small left-sided renal calculi but no obstructing ureteral calculi or bladder calculi. Small hyperdense/ hemorrhagic cysts are noted bilaterally. Stomach/Bowel: The stomach, duodenum, small bowel and colon are grossly normal. No inflammatory changes, mass lesions or obstructive findings. The terminal ileum is normal. The appendix is normal. Vascular/Lymphatic: Moderate atherosclerotic  calcifications involving the aorta and branch vessel ostia. No aneurysm. Small scattered mesenteric and retroperitoneal lymph nodes but no mass or adenopathy. Other: No pelvic mass or adenopathy. No free pelvic fluid collections. No inguinal mass are adenopathy. There is a left inguinal hernia containing fat. Musculoskeletal: No significant bony findings. Degenerative changes involving the spine and hips. IMPRESSION: 1. High-grade obstruction of the right kidney by a 10 x 10 mm UPJ calculus. 2. Bilateral renal calculi. 3. Small right pleural effusion with overlying atelectasis. 4. Moderate atherosclerotic calcifications involving the aorta and branch vessel ostia. No aneurysm. Electronically Signed   By: Marijo Sanes M.D.   On: 04/05/2016 18:36   Dg Chest 2 View  04/05/2016  CLINICAL DATA:  Fever for 2 days EXAM: CHEST  2 VIEW COMPARISON:  09/23/2015 FINDINGS: Linear opacity at the right base most consistent with atelectasis. No lobar pneumonia, edema, effusion, or pneumothorax. Normal heart size and stable mediastinal contours. IMPRESSION: Right perihilar atelectasis. In the setting of fever this could reflect airway infection. No consolidating pneumonia. Electronically Signed   By: Monte Fantasia M.D.   On: 04/05/2016 16:19   I have personally reviewed and evaluated these images and lab results as part of my medical decision-making.  Medications  acetaminophen (TYLENOL) tablet 1,000 mg (1,000 mg Oral Given 04/05/16 1519)  sodium chloride 0.9 % bolus 1,000 mL (1,000 mLs Intravenous New Bag/Given 04/05/16 1721)  cefTRIAXone (ROCEPHIN) 1 g in dextrose 5 % 50 mL IVPB (0 g Intravenous Stopped 04/05/16 1747)     MDM   Final diagnoses:  Pyelonephritis  Ureteral stone with hydronephrosis  Essential hypertension   Patient's urinalysis is consistent with a urinary tract infection. I suspect his symptoms are related to pyelonephritis. He has a fever, elevated white blood cell count but fortunately no  signs of sepsis.  CT scan shows a 10 mm stone in the right low pelvic junction with hydronephrosis.  I will consult with urology. The patient may need intervention to relieve the obstruction. I will consult the medical service for admission to the hospital for IV antibiotics and close monitoring.   I personally performed the services described in this documentation, which was scribed in my presence.  The recorded information has been reviewed and is accurate.    Dorie Rank, MD 04/05/16 1905  Discussed with Dr Diona Fanti.  Pt will need a stent.  He will see the patient at [facility], MD 04/05/16 [phone]

## 2016-04-05 NOTE — Plan of Care (Signed)
70 yo M with Right flank Pain x 2 days, Fever to 103, N+V, found to have Pyelonephritis, and Hydronephrosis from an Obstructing UPJ Calculus seen on CT scan, +AKI. Urology Dr Juliette Alcide wants to place a Stent tonight. Given IV Rocephin x 1 Going to MED/Surg Bed, Hemodynamically stable.  EDP: Dr Marye Round PCP: Dr Virgina Jock

## 2016-04-05 NOTE — Anesthesia Procedure Notes (Signed)
Procedure Name: LMA Insertion Date/Time: 04/05/2016 11:40 PM Performed by: Noralyn Pick D Pre-anesthesia Checklist: Patient identified, Emergency Drugs available, Suction available and Patient being monitored Patient Re-evaluated:Patient Re-evaluated prior to inductionOxygen Delivery Method: Circle System Utilized Preoxygenation: Pre-oxygenation with 100% oxygen Intubation Type: IV induction Ventilation: Mask ventilation without difficulty LMA Size: 5.0 Tube type: Oral Number of attempts: 1 Placement Confirmation: positive ETCO2 and breath sounds checked- equal and bilateral Tube secured with: Tape Dental Injury: Teeth and Oropharynx as per pre-operative assessment

## 2016-04-05 NOTE — ED Notes (Signed)
MD at bedside with patient

## 2016-04-05 NOTE — ED Notes (Signed)
Patient transported to X-ray 

## 2016-04-06 ENCOUNTER — Encounter (HOSPITAL_COMMUNITY): Payer: Self-pay | Admitting: Urology

## 2016-04-06 DIAGNOSIS — N132 Hydronephrosis with renal and ureteral calculous obstruction: Secondary | ICD-10-CM | POA: Insufficient documentation

## 2016-04-06 DIAGNOSIS — N179 Acute kidney failure, unspecified: Secondary | ICD-10-CM

## 2016-04-06 DIAGNOSIS — N200 Calculus of kidney: Secondary | ICD-10-CM

## 2016-04-06 DIAGNOSIS — R319 Hematuria, unspecified: Secondary | ICD-10-CM

## 2016-04-06 DIAGNOSIS — IMO0001 Reserved for inherently not codable concepts without codable children: Secondary | ICD-10-CM | POA: Diagnosis present

## 2016-04-06 DIAGNOSIS — R030 Elevated blood-pressure reading, without diagnosis of hypertension: Secondary | ICD-10-CM

## 2016-04-06 DIAGNOSIS — N390 Urinary tract infection, site not specified: Secondary | ICD-10-CM

## 2016-04-06 DIAGNOSIS — N12 Tubulo-interstitial nephritis, not specified as acute or chronic: Secondary | ICD-10-CM

## 2016-04-06 DIAGNOSIS — A419 Sepsis, unspecified organism: Secondary | ICD-10-CM | POA: Insufficient documentation

## 2016-04-06 LAB — CBC
HEMATOCRIT: 36.3 % — AB (ref 39.0–52.0)
Hemoglobin: 12.1 g/dL — ABNORMAL LOW (ref 13.0–17.0)
MCH: 29.9 pg (ref 26.0–34.0)
MCHC: 33.3 g/dL (ref 30.0–36.0)
MCV: 89.6 fL (ref 78.0–100.0)
Platelets: 143 10*3/uL — ABNORMAL LOW (ref 150–400)
RBC: 4.05 MIL/uL — ABNORMAL LOW (ref 4.22–5.81)
RDW: 14.6 % (ref 11.5–15.5)
WBC: 14.4 10*3/uL — ABNORMAL HIGH (ref 4.0–10.5)

## 2016-04-06 LAB — BASIC METABOLIC PANEL
Anion gap: 8 (ref 5–15)
BUN: 25 mg/dL — AB (ref 6–20)
CALCIUM: 8.2 mg/dL — AB (ref 8.9–10.3)
CO2: 21 mmol/L — ABNORMAL LOW (ref 22–32)
Chloride: 115 mmol/L — ABNORMAL HIGH (ref 101–111)
Creatinine, Ser: 1.25 mg/dL — ABNORMAL HIGH (ref 0.61–1.24)
GFR calc Af Amer: >60 mL/min (ref >60)
GFR, EST NON AFRICAN AMERICAN: 57 mL/min — AB (ref >60)
GLUCOSE: 162 mg/dL — AB (ref 65–99)
POTASSIUM: 3.4 mmol/L — AB (ref 3.5–5.1)
Sodium: 144 mmol/L (ref 135–145)

## 2016-04-06 LAB — SURGICAL PCR SCREEN
MRSA, PCR: NEGATIVE
STAPHYLOCOCCUS AUREUS: POSITIVE — AB

## 2016-04-06 MED ORDER — DEXTROSE 5 % IV SOLN
1.0000 g | Freq: Once | INTRAVENOUS | Status: AC
  Administered 2016-04-06: 1 g via INTRAVENOUS
  Filled 2016-04-06: qty 10

## 2016-04-06 MED ORDER — SODIUM CHLORIDE 0.9 % IV SOLN
INTRAVENOUS | Status: DC
  Administered 2016-04-06: 22:00:00 via INTRAVENOUS

## 2016-04-06 MED ORDER — IBUPROFEN 400 MG PO TABS
400.0000 mg | ORAL_TABLET | Freq: Once | ORAL | Status: AC
  Administered 2016-04-06: 400 mg via ORAL
  Filled 2016-04-06: qty 1

## 2016-04-06 MED ORDER — HYDRALAZINE HCL 20 MG/ML IJ SOLN: 10.0000 mg | Freq: Three times a day (TID) | INTRAMUSCULAR | Status: DC | PRN

## 2016-04-06 MED ORDER — PANTOPRAZOLE SODIUM 40 MG PO TBEC
40.0000 mg | DELAYED_RELEASE_TABLET | Freq: Every day | ORAL | Status: DC
  Administered 2016-04-06 – 2016-04-07 (×2): 40 mg via ORAL
  Filled 2016-04-06 (×2): qty 1

## 2016-04-06 MED ORDER — DEXTROSE 5 % IV SOLN
2.0000 g | INTRAVENOUS | Status: DC
  Administered 2016-04-06: 2 g via INTRAVENOUS
  Filled 2016-04-06 (×2): qty 2

## 2016-04-06 MED ORDER — FENTANYL CITRATE (PF) 100 MCG/2ML IJ SOLN: 25.0000 ug | INTRAMUSCULAR | Status: DC | PRN

## 2016-04-06 MED ORDER — OXYCODONE HCL 5 MG PO TABS
5.0000 mg | ORAL_TABLET | Freq: Four times a day (QID) | ORAL | Status: DC | PRN
Start: 2016-04-06 — End: 2016-04-07

## 2016-04-06 MED ORDER — HYDRALAZINE HCL 20 MG/ML IJ SOLN: 5.0000 mg | Freq: Four times a day (QID) | INTRAMUSCULAR | Status: DC | PRN

## 2016-04-06 MED ORDER — SODIUM CHLORIDE 0.9 % IV BOLUS (SEPSIS)
1000.0000 mL | Freq: Once | INTRAVENOUS | Status: AC
  Administered 2016-04-06: 1000 mL via INTRAVENOUS

## 2016-04-06 NOTE — Progress Notes (Signed)
Patient seen and examined. Admitted after midnight secondary to Sepsis due to UTI/pyelonephritis. No CP, no SOB. Currently with significant improvement on his fever curve and also improve in urine output. Patient's Hr stable and WBC's trending down. Please refer to H&P written by Dr. Arnoldo Morale for further info/details on admission.  Plan: -continue current IV antibiotics -follow urology rec's -follow clinical course and culture data  Barton Dubois [phone]

## 2016-04-06 NOTE — Anesthesia Postprocedure Evaluation (Signed)
Anesthesia Post Note  Patient: Eddie Walker  Procedure(s) Performed: Procedure(s) (LRB): CYSTOSCOPY WITH RETROGRADE PYELOGRAM, URETEROSCOPY AND STENT PLACEMENT (Right)  Patient location during evaluation: PACU Anesthesia Type: General Level of consciousness: sedated Pain management: satisfactory to patient Vital Signs Assessment: post-procedure vital signs reviewed and stable Respiratory status: spontaneous breathing Cardiovascular status: stable Anesthetic complications: no    Last Vitals:  Filed Vitals:   04/06/16 1258 04/06/16 1652  BP: 123/86 139/78  Pulse: 84 84  Temp: 36.6 C 37.2 C  Resp: 16 16    Last Pain:  Filed Vitals:   04/06/16 1821  PainSc: [place]

## 2016-04-06 NOTE — H&P (Signed)
[facility] Admission History and Physical       Eddie Walker [id number] DOB: May 06, 1946 DOA: 04/05/2016    Referring physician:  EDP PCP: Precious Reel, MD  Specialists:   Chief Complaint: Right Flank Pain and Fever  HPI: Eddie Walker is a 70 y.o. male with a history of Nephrolithiasis who presented to the [facility] ED with Right flank Pain x 2 days, He has had Nausea and Vomiting for the past 2 days and fevers and chills and Fever to 103 today.  He was found to have Pyelonephritis, and Hydronephrosis from an Obstructing UPJ Calculus seen on CT scan, and +AKI. Urology Dr Juliette Alcide wants to place a Stent tonight. Given IV Rocephin x 1 and was referred for medical admission.     Review of Systems:  Constitutional: No Weight Loss, No Weight Gain, Night Sweats, +Fevers, +Chills, Dizziness, Light Headedness, Fatigue, or Generalized Weakness HEE NT: No Headaches, Difficulty Swallowing,Tooth/Dental Problems,Sore Throat,  No Sneezing, Rhinitis, Ear Ache, Nasal Congestion, or Post Nasal Drip,  Cardio-vascular:  No Chest pain, Orthopnea, PND, Edema in Lower Extremities, Anasarca, Dizziness, Palpitations  Resp: No Dyspnea, No DOE, No Productive Cough, No Non-Productive Cough, No Hemoptysis, No Wheezing.    GI: No Heartburn, Indigestion, Abdominal Pain, +Nausea, +Vomiting, Diarrhea, Constipation, Hematemesis, Hematochezia, Melena, Change in Bowel Habits,  Loss of Appetite  GU: No Dysuria, No Change in Color of Urine, No Urgency or Urinary Frequency, +Right Flank pain.  Musculoskeletal: No Joint Pain or Swelling, No Decreased Range of Motion, No Back Pain.  Neurologic: No Syncope, No Seizures, Muscle Weakness, Paresthesia, Vision Disturbance or Loss, No Diplopia, No Vertigo, No Difficulty Walking,  Skin: No Rash or Lesions. Psych: No Change in Mood or Affect, No Depression or Anxiety, No Memory loss, No Confusion, or Hallucinations   Past Medical History  Diagnosis  Date  . Hernia 01/2013  . Kidney stone 2008  . GERD (gastroesophageal reflux disease)   . H/O hiatal hernia      Past Surgical History  Procedure Laterality Date  . Orchiectomy Right 1971  . Knee arthroscopy Left 2008  . Hernia repair      left  . Teeth pulled  2010  . Extracorporeal shock wave lithotripsy Right 2014  . Cystoscopy with stent placement Left 04/16/2014    Procedure: CYSTOSCOPY WITH STENT PLACEMENT;  Surgeon: Irine Seal, MD;  Location: [facility];  Service: Urology;  Laterality: Left;      Prior to Admission medications   Medication Sig Start Date End Date Taking? Authorizing Provider  acetaminophen (TYLENOL) 500 MG tablet Take 500 mg by mouth every 6 (six) hours as needed for mild pain.    Historical Provider, MD  albuterol (PROVENTIL HFA;VENTOLIN HFA) 108 (90 BASE) MCG/ACT inhaler Inhale 1-2 puffs into the lungs every 6 (six) hours as needed for wheezing or shortness of breath. 08/27/15   April Palumbo, MD  dutasteride (AVODART) 0.5 MG capsule Take 0.5 mg by mouth daily.    Historical Provider, MD  EPINEPHrine (EPIPEN) 0.3 mg/0.3 mL SOAJ injection Inject 0.3 mg into the muscle as needed (anaphylaxis).    Historical Provider, MD  glucosamine-chondroitin 500-400 MG tablet Take 1 tablet by mouth 2 (two) times daily.     Historical Provider, MD  Multiple Vitamin (MULTIVITAMIN WITH MINERALS) TABS Take 1 tablet by mouth daily.    Historical Provider, MD  omeprazole (PRILOSEC) 20 MG capsule Take 20 mg by mouth daily.    Historical Provider, MD  omeprazole (PRILOSEC) 20 MG capsule Take 1 capsule (20 mg total) by mouth daily. 08/27/15   April Palumbo, MD  polyethylene glycol powder (MIRALAX) powder Take 17 g by mouth daily. 08/27/15   April Palumbo, MD  Spacer/Aero-Holding [name] (E-Z SPACER) inhaler Use as instructed 08/27/15   April Palumbo, MD     Allergies  Allergen Reactions  . Peanut-Containing Drug Products Anaphylaxis    REACTION: Patient has Epipen to treat this-  anaphylaxis shock  . Novocain [Procaine Hcl] Itching and Rash    Low grade fever    Social History:  reports that he quit smoking about 6 years ago. His smoking use included Cigarettes, Pipe, and Cigars. He smoked 1.50 packs per day. He does not have any smokeless tobacco history on file. He reports that he drinks about 0.6 oz of alcohol per week. He reports that he does not use illicit drugs.     History reviewed. No pertinent family history.     Physical Exam:  GEN:  Pleasant Obese 70 y.o.  Caucasian male examined and in no acute distress; cooperative with exam Filed Vitals:   04/05/16 1815 04/05/16 2021 04/05/16 2134 04/05/16 2227  BP: 172/113 146/101 142/101 150/90  Pulse: 102 96 93 92  Temp: 99.4 F (37.4 C) 98.9 F (37.2 C)  99.2 F (37.3 C)  TempSrc: Oral Oral  Oral  Resp: 22 19 20 18   Height:    6\' 1"  (1.854 m)  Weight:    97.6 kg (215 lb 2.7 oz)  SpO2: 95% 96% 95% 96%   Blood pressure 150/90, pulse 92, temperature 99.2 F (37.3 C), temperature source Oral, resp. rate 18, height 6\' 1"  (1.854 m), weight 97.6 kg (215 lb 2.7 oz), SpO2 96 %. PSYCH: He is alert and oriented x4; does not appear anxious does not appear depressed; affect is normal HEENT: Normocephalic and Atraumatic, Mucous membranes pink; PERRLA; EOM intact; Fundi:  Benign;  No scleral icterus, Nares: Patent, Oropharynx: Clear, Fair Dentition,    Neck:  FROM, No Cervical Lymphadenopathy nor Thyromegaly or Carotid Bruit; No JVD; Breasts:: Not examined CHEST WALL: No tenderness CHEST: Normal respiration, clear to auscultation bilaterally HEART: Regular rate and rhythm; no murmurs rubs or gallops BACK: No kyphosis or scoliosis; +Right CVA tenderness ABDOMEN: Positive Bowel Sounds, Obese, Soft Non-Tender, No Rebound or Guarding; No Masses, No Organomegaly. Rectal Exam: Not done EXTREMITIES: No Cyanosis, Clubbing, or Edema; No Ulcerations. Genitalia: not examined PULSES: 2+ and symmetric SKIN: Normal hydration  no rash or ulceration CNS:  Alert and Oriented x 4, No Focal Deficits Vascular: pulses palpable throughout    Labs on Admission:  Basic Metabolic Panel:  Recent Labs Lab 04/05/16 1557  NA 137  K 3.4*  CL 108  CO2 22  GLUCOSE 184*  BUN 26*  CREATININE 1.43*  CALCIUM 8.8*   Liver Function Tests:  Recent Labs Lab 04/05/16 1557  AST 22  ALT 23  ALKPHOS 72  BILITOT 1.1  PROT 7.3  ALBUMIN 3.6   No results for input(s): LIPASE, AMYLASE in the last 168 hours. No results for input(s): AMMONIA in the last 168 hours. CBC:  Recent Labs Lab 04/05/16 1557  WBC 16.9*  NEUTROABS 15.8*  HGB 14.1  HCT 42.6  MCV 91.0  PLT 162   Cardiac Enzymes: No results for input(s): CKTOTAL, CKMB, CKMBINDEX, TROPONINI in the last 168 hours.  BNP (last 3 results) No results for input(s): BNP in the last 8760 hours.  ProBNP (last 3 results) No results  for input(s): PROBNP in the last 8760 hours.  CBG: No results for input(s): GLUCAP in the last 168 hours.  Radiological Exams on Admission: Ct Abdomen Pelvis Wo Contrast  04/05/2016  CLINICAL DATA:  Right flank pain since Sunday.  Fever. EXAM: CT ABDOMEN AND PELVIS WITHOUT CONTRAST TECHNIQUE: Multidetector CT imaging of the abdomen and pelvis was performed following the standard protocol without IV contrast. COMPARISON:  04/23/2014 FINDINGS: Lower chest: Small right pleural effusion and overlying atelectasis. The left lung base is clear. The heart is normal in size. No pericardial effusion. The distal esophagus is grossly normal. Hepatobiliary: Simple hepatic cyst. No worrisome hepatic lesions or intrahepatic biliary dilatation. The gallbladder is grossly normal. Pancreas: No mass, inflammation or ductal dilatation. Spleen: Normal size.  No focal lesions. Adrenals/Urinary Tract: Stable moderate nodularity of both adrenal glands, likely benign adenomas. Moderate-to-marked right-sided hydronephrosis due to a large obstructing UPJ calculus  measuring 10 x 10 mm. Smaller renal calculi are noted. Perinephric fluid surrounding the right kidney in tracking down along the right ureter and into the extraperitoneal pelvis consistent with high-grade obstruction. Multiple small left-sided renal calculi but no obstructing ureteral calculi or bladder calculi. Small hyperdense/ hemorrhagic cysts are noted bilaterally. Stomach/Bowel: The stomach, duodenum, small bowel and colon are grossly normal. No inflammatory changes, mass lesions or obstructive findings. The terminal ileum is normal. The appendix is normal. Vascular/Lymphatic: Moderate atherosclerotic calcifications involving the aorta and branch vessel ostia. No aneurysm. Small scattered mesenteric and retroperitoneal lymph nodes but no mass or adenopathy. Other: No pelvic mass or adenopathy. No free pelvic fluid collections. No inguinal mass are adenopathy. There is a left inguinal hernia containing fat. Musculoskeletal: No significant bony findings. Degenerative changes involving the spine and hips. IMPRESSION: 1. High-grade obstruction of the right kidney by a 10 x 10 mm UPJ calculus. 2. Bilateral renal calculi. 3. Small right pleural effusion with overlying atelectasis. 4. Moderate atherosclerotic calcifications involving the aorta and branch vessel ostia. No aneurysm. Electronically Signed   By: Marijo Sanes M.D.   On: 04/05/2016 18:36   Dg Chest 2 View  04/05/2016  CLINICAL DATA:  Fever for 2 days EXAM: CHEST  2 VIEW COMPARISON:  09/23/2015 FINDINGS: Linear opacity at the right base most consistent with atelectasis. No lobar pneumonia, edema, effusion, or pneumothorax. Normal heart size and stable mediastinal contours. IMPRESSION: Right perihilar atelectasis. In the setting of fever this could reflect airway infection. No consolidating pneumonia. Electronically Signed   By: Monte Fantasia M.D.   On: 04/05/2016 16:19      Assessment/Plan:   70 y.o. male with  Active Problems:      Hydronephrosis of right kidney   Urology Dr Eulogio Ditch consulted    For Placement of Ureteral Stent         Kidney stone   Urology for Stone Removal   Pain Control PRN    Pyelonephritis   Urine C+S sent   IV Rocephin      AKI (acute kidney injury) ([place])   IVFs   Monitor BUN/Cr   Avoid Nephrotoxins      Elevated blood pressure   PRN IV Hydralazine for SBP > 160    DVT Prophylaxis   SCDs    Code Status:     FULL CODE  Family Communication:   Wife at Bedside  Disposition Plan:    Inpatient Status        Time spent:  [facility] Pager [phone]  If 7AM -7PM Please Contact the Day Rounding Team MD for [facility]  If 7PM-7AM, Please Contact Night-Floor Coverage  [email or web address] Password [email or web address] 04/06/2016, 1:58 AM     ADDENDUM:   Patient was seen and examined on 04/06/2016

## 2016-04-06 NOTE — Progress Notes (Addendum)
Rapid Response Event Note  Overview:  Rapid Response nurse called to room 1404. Primary nurse concerned about respiratory status and heart rate. Patient s/p cystoscopy.     Initial Focused Assessment:  Patient alert/oriented states he feels "terrible" and short of breath. RR 40/min, Venti Mask 8L - 96%,Blood Pressure 179/109 Sinus Tach 150-160, Rectal temp 106.5   MEWS=6   Interventions: [facility] on call notified of patient condition. Spoke with K.Schorr about assessment findings. Orders written for motrin, tylenol, NS bolus and continue to monitor patient on current unit. Primary nurse will reassess vitals in hour and call if additional assistance is needed by rapid response nurse.    Event Summary:  04/06/16 at  [place], [place]

## 2016-04-07 DIAGNOSIS — N200 Calculus of kidney: Secondary | ICD-10-CM

## 2016-04-07 DIAGNOSIS — N132 Hydronephrosis with renal and ureteral calculous obstruction: Secondary | ICD-10-CM

## 2016-04-07 DIAGNOSIS — N1330 Unspecified hydronephrosis: Secondary | ICD-10-CM

## 2016-04-07 LAB — URINE CULTURE

## 2016-04-07 LAB — BASIC METABOLIC PANEL
ANION GAP: 8 (ref 5–15)
BUN: 23 mg/dL — ABNORMAL HIGH (ref 6–20)
CALCIUM: 8.7 mg/dL — AB (ref 8.9–10.3)
CHLORIDE: 114 mmol/L — AB (ref 101–111)
CO2: 24 mmol/L (ref 22–32)
Creatinine, Ser: 1.13 mg/dL (ref 0.61–1.24)
GFR calc non Af Amer: >60 mL/min (ref >60)
GLUCOSE: 144 mg/dL — AB (ref 65–99)
Potassium: 3.6 mmol/L (ref 3.5–5.1)
Sodium: 146 mmol/L — ABNORMAL HIGH (ref 135–145)

## 2016-04-07 LAB — CBC
HEMATOCRIT: 38.6 % — AB (ref 39.0–52.0)
HEMOGLOBIN: 12.5 g/dL — AB (ref 13.0–17.0)
MCH: 29.3 pg (ref 26.0–34.0)
MCHC: 32.4 g/dL (ref 30.0–36.0)
MCV: 90.4 fL (ref 78.0–100.0)
Platelets: 162 10*3/uL (ref 150–400)
RBC: 4.27 MIL/uL (ref 4.22–5.81)
RDW: 14.9 % (ref 11.5–15.5)
WBC: 8.9 10*3/uL (ref 4.0–10.5)

## 2016-04-07 MED ORDER — SULFAMETHOXAZOLE-TRIMETHOPRIM 800-160 MG PO TABS: 1.0000 | ORAL_TABLET | Freq: Two times a day (BID) | ORAL | Status: DC

## 2016-04-07 NOTE — Clinical Documentation Improvement (Signed)
Internal Medicine  Can the diagnosis of Short Of Breath be further specified?   Document Acuity - Acute, Chronic, Acute on Chronic  Document Inclusion Of - Hypoxia, Hypercapnia, Combination of Both  Other  Clinically Undetermined  Document any associated diagnoses/conditions. Please update your documentation within the medical record to reflect your response to this query. Thank you.  Supporting Information:(As per notes) "Sepsis due to UTI/pyelonephritis"  Rapid Response nurse called to room 1404. Primary nurse concerned about respiratory status and heart rate. Patient s/p cystoscopy. Initial Focused Assessment:  Patient alert/oriented states he feels "terrible" and short of breath. RR 40/min, Venti Mask 8L - 96%,Blood Pressure 179/109 Sinus Tach 150-160, Rectal temp 106.5   Please exercise your independent, professional judgment when responding. A specific answer is not anticipated or expected.   Thank You,  [place] [phone]

## 2016-04-07 NOTE — Discharge Summary (Addendum)
Physician Discharge Summary  Eddie Walker [id number] DOB: 1946-05-01 DOA: 04/05/2016  PCP: Eddie Reel, MD  Admit date: 04/05/2016 Discharge date: 04/07/2016  Time spent: 35 minutes  Recommendations for Outpatient Follow-up:  1. Eddie. Wickizer admitted for right UPJ stone having a urinary tract infection. He was seen by urology during this hospitalization who recommended empiric antimicrobial therapy with Septra 2 weeks for empiric coverage as urine cultures unremarkable. 2. He'll be treated with ESWL once completing antimicrobial therapy.   Discharge Diagnoses:  Active Problems:   Pyelonephritis   Hydronephrosis of right kidney   AKI (acute kidney injury) (Eddie Walker)   Kidney stone   Elevated blood pressure   Ureteral stone with hydronephrosis   Urinary tract infectious disease   Acute respiratory failure ([name])   Discharge Condition: Stable   Diet recommendation: Regular diet  Filed Weights   04/05/16 1513 04/05/16 2227  Weight: 97.977 kg (216 lb) 97.6 kg (215 lb 2.7 oz)    History of present illness:  Eddie Walker is a 70 y.o. male with a history of Nephrolithiasis who presented to the [facility] ED with Right flank Pain x 2 days, He has had Nausea and Vomiting for the past 2 days and fevers and chills and Fever to 103 today. He was found to have Pyelonephritis, and Hydronephrosis from an Obstructing UPJ Calculus seen on CT scan, and +AKI. Urology Dr Juliette Alcide wants to place a Stent tonight. Given IV Rocephin x 1 and was referred for medical admission.   Hospital Course:  Eddie Walker is a pleasant 70 year old gentleman with a history of nephrolithiasis, admitted on 04/06/2016 when he presented with complaints of right leg pain associate with a temperature 103. A CT scan of abdomen and pelvis revealed a high-grade obstruction of the right kidney by a 10 x 10 mm UPJ calculus. He was seen and evaluated by urology undergoing cystoscopy with placement of right double-J stent,  procedure performed on 04/05/2016 by Dr.Dahlstedt. He was also started on empiric IV antibiotic therapy with ceftriaxone. Patient tolerated procedure well there were no immediate complications. I 04/07/2016 he showed clinical improvement. He was evaluated by Dr.Grapey of urology who felt he was stable for discharge from a urologic standpoint. Urinalysis unremarkable. He was discharged on Bactrim DS one tablet by mouth twice a day 14 days as recommended by urology. Hospitalization complicated by acute respiratory failure as on 04/06/2016 he was noted to be in respiratory distress required Venti mask, having respiratory to 40 breaths per minutes, sinus tach of 150. Respiratory failure resolved during this hospitalization. Chest x-ray on 04/05/2016 did not reveal pneumonia or edema.  Procedures:  Cystoscopy with placement of right double-J stent procedure performed on 04/05/2016  Consultations:  Urology  Discharge Exam: Filed Vitals:   04/07/16 0200 04/07/16 0544  BP: 132/65 137/94  Pulse: 67 95  Temp: 98.2 F (36.8 C) 98.9 F (37.2 C)  Resp: 16 16    General: Patient is in no acute distress, he is anxious to go home today. States feeling much better Cardiovascular: Regular rate and rhythm normal S1-S2 no murmurs or gallops Respiratory: Normal respiratory effort, lungs are clear to auscultation bilaterally Abdomen: Soft nontender nondistended  Discharge Instructions   Discharge Instructions    Call MD for:  difficulty breathing, headache or visual disturbances    Complete by:  As directed      Call MD for:  extreme fatigue    Complete by:  As directed      Call MD for:  hives  Complete by:  As directed      Call MD for:  persistant dizziness or light-headedness    Complete by:  As directed      Call MD for:  persistant nausea and vomiting    Complete by:  As directed      Call MD for:  redness, tenderness, or signs of infection (pain, swelling, redness, odor or green/yellow  discharge around incision site)    Complete by:  As directed      Call MD for:  severe uncontrolled pain    Complete by:  As directed      Call MD for:  temperature >100.4    Complete by:  As directed      Call MD for:    Complete by:  As directed      Diet - low sodium heart healthy    Complete by:  As directed      Increase activity slowly    Complete by:  As directed           Discharge Medication List as of 04/07/2016  1:10 PM    START taking these medications   Details  sulfamethoxazole-trimethoprim (BACTRIM DS,SEPTRA DS) 800-160 MG tablet Take 1 tablet by mouth 2 (two) times daily., Starting 04/07/2016, Until Discontinued, Print      CONTINUE these medications which have NOT CHANGED   Details  acetaminophen (TYLENOL) 500 MG tablet Take 500 mg by mouth every 6 (six) hours as needed for mild pain., Until Discontinued, Historical Med    aspirin EC 81 MG tablet Take 81 mg by mouth daily., Until Discontinued, Historical Med    dutasteride (AVODART) 0.5 MG capsule Take 0.5 mg by mouth daily., Until Discontinued, Historical Med    EPINEPHrine (EPIPEN) 0.3 mg/0.3 mL SOAJ injection Inject 0.3 mg into the muscle as needed (anaphylaxis)., Until Discontinued, Historical Med    glucosamine-chondroitin 500-400 MG tablet Take 1 tablet by mouth daily. , Until Discontinued, Historical Med    Multiple Vitamin (MULTIVITAMIN WITH MINERALS) TABS Take 1 tablet by mouth daily., Until Discontinued, Historical Med    albuterol (PROVENTIL HFA;VENTOLIN HFA) 108 (90 BASE) MCG/ACT inhaler Inhale 1-2 puffs into the lungs every 6 (six) hours as needed for wheezing or shortness of breath., Starting 08/27/2015, Until Discontinued, Print    omeprazole (PRILOSEC) 20 MG capsule Take 1 capsule (20 mg total) by mouth daily., Starting 08/27/2015, Until Discontinued, Print    polyethylene glycol powder (MIRALAX) powder Take 17 g by mouth daily., Starting 08/27/2015, Until Discontinued, Print      STOP taking  these medications     Spacer/Aero-Holding Chambers (E-Z SPACER) inhaler        Allergies  Allergen Reactions  . Peanut-Containing Drug Products Anaphylaxis    REACTION: Patient has Epipen to treat this- anaphylaxis shock  . Novocain [Procaine Hcl] Itching and Rash    Low grade fever   Follow-up Information    Follow up with Eddie Reel, MD In 2 weeks.   Specialty:  Internal Medicine   Contact information:   [place] [address] [phone]       Follow up with Bernestine Amass, MD In 1 week.   Specialty:  Urology   Contact information:   [place] [address] [phone]        The results of significant diagnostics from this hospitalization (including imaging, microbiology, ancillary and laboratory) are listed below for reference.    Significant Diagnostic Studies: Ct Abdomen Pelvis Wo  Contrast  04/05/2016  CLINICAL DATA:  Right flank pain since Sunday.  Fever. EXAM: CT ABDOMEN AND PELVIS WITHOUT CONTRAST TECHNIQUE: Multidetector CT imaging of the abdomen and pelvis was performed following the standard protocol without IV contrast. COMPARISON:  04/23/2014 FINDINGS: Lower chest: Small right pleural effusion and overlying atelectasis. The left lung base is clear. The heart is normal in size. No pericardial effusion. The distal esophagus is grossly normal. Hepatobiliary: Simple hepatic cyst. No worrisome hepatic lesions or intrahepatic biliary dilatation. The gallbladder is grossly normal. Pancreas: No mass, inflammation or ductal dilatation. Spleen: Normal size.  No focal lesions. Adrenals/Urinary Tract: Stable moderate nodularity of both adrenal glands, likely benign adenomas. Moderate-to-marked right-sided hydronephrosis due to a large obstructing UPJ calculus measuring 10 x 10 mm. Smaller renal calculi are noted. Perinephric fluid surrounding the right kidney in tracking down along the right ureter and into the extraperitoneal pelvis consistent  with high-grade obstruction. Multiple small left-sided renal calculi but no obstructing ureteral calculi or bladder calculi. Small hyperdense/ hemorrhagic cysts are noted bilaterally. Stomach/Bowel: The stomach, duodenum, small bowel and colon are grossly normal. No inflammatory changes, mass lesions or obstructive findings. The terminal ileum is normal. The appendix is normal. Vascular/Lymphatic: Moderate atherosclerotic calcifications involving the aorta and branch vessel ostia. No aneurysm. Small scattered mesenteric and retroperitoneal lymph nodes but no mass or adenopathy. Other: No pelvic mass or adenopathy. No free pelvic fluid collections. No inguinal mass are adenopathy. There is a left inguinal hernia containing fat. Musculoskeletal: No significant bony findings. Degenerative changes involving the spine and hips. IMPRESSION: 1. High-grade obstruction of the right kidney by a 10 x 10 mm UPJ calculus. 2. Bilateral renal calculi. 3. Small right pleural effusion with overlying atelectasis. 4. Moderate atherosclerotic calcifications involving the aorta and branch vessel ostia. No aneurysm. Electronically Signed   By: Marijo Sanes M.D.   On: 04/05/2016 18:36   Dg Chest 2 View  04/05/2016  CLINICAL DATA:  Fever for 2 days EXAM: CHEST  2 VIEW COMPARISON:  09/23/2015 FINDINGS: Linear opacity at the right base most consistent with atelectasis. No lobar pneumonia, edema, effusion, or pneumothorax. Normal heart size and stable mediastinal contours. IMPRESSION: Right perihilar atelectasis. In the setting of fever this could reflect airway infection. No consolidating pneumonia. Electronically Signed   By: Monte Fantasia M.D.   On: 04/05/2016 16:19    Microbiology: Recent Results (from the past 240 hour(s))  Urine culture     Status: Abnormal   Collection Time: 04/05/16  3:15 PM  Result Value Ref Range Status   Specimen Description URINE, CLEAN CATCH  Final   Special Requests NONE  Final   Culture (A)   Final    5,000 COLONIES/mL INSIGNIFICANT GROWTH Performed at [facility]    Report Status 04/07/2016 FINAL  Final  Blood Culture (routine x 2)     Status: None   Collection Time: 04/05/16  3:57 PM  Result Value Ref Range Status   Specimen Description BLOOD LEFT ANTECUBITAL  Final   Special Requests BOTTLES DRAWN AEROBIC AND ANAEROBIC  5ML  Final   Culture   Final    NO GROWTH 5 DAYS Performed at [facility]    Report Status 04/10/2016 FINAL  Final  Blood Culture (routine x 2)     Status: None   Collection Time: 04/05/16  4:02 PM  Result Value Ref Range Status   Specimen Description BLOOD RIGHT ANTECUBITAL  Final   Special Requests BOTTLES DRAWN AEROBIC AND ANAEROBIC  5 ML  Final   Culture   Final    NO GROWTH 5 DAYS Performed at [facility]    Report Status 04/10/2016 FINAL  Final  Surgical PCR screen     Status: Abnormal   Collection Time: 04/05/16 11:03 PM  Result Value Ref Range Status   MRSA, PCR NEGATIVE NEGATIVE Final   Staphylococcus aureus POSITIVE (A) NEGATIVE Final    Comment:        The Xpert SA Assay (FDA approved for NASAL specimens in patients over 52 years of age), is one component of a comprehensive surveillance program.  Test performance has been validated by [facility] for patients greater than or equal to 57 year old. It is not intended to diagnose infection nor to guide or monitor treatment.      Labs: Basic Metabolic Panel: No results for input(s): NA, K, CL, CO2, GLUCOSE, BUN, CREATININE, CALCIUM, MG, PHOS in the last 168 hours. Liver Function Tests: No results for input(s): AST, ALT, ALKPHOS, BILITOT, PROT, ALBUMIN in the last 168 hours. No results for input(s): LIPASE, AMYLASE in the last 168 hours. No results for input(s): AMMONIA in the last 168 hours. CBC: No results for input(s): WBC, NEUTROABS, HGB, HCT, MCV, PLT in the last 168 hours. Cardiac Enzymes: No results for input(s): CKTOTAL, CKMB, CKMBINDEX,  TROPONINI in the last 168 hours. BNP: BNP (last 3 results) No results for input(s): BNP in the last 8760 hours.  ProBNP (last 3 results) No results for input(s): PROBNP in the last 8760 hours.  CBG: No results for input(s): GLUCAP in the last 168 hours.     Signed:  Kelvin Cellar MD.  [facility] 04/14/2016, 10:50 PM

## 2016-04-07 NOTE — Progress Notes (Signed)
2 Days Post-Op Subjective: Patient reports feeling good. No complaints. Tolerating stent well. Urine culture showing less than 5000 colonies. 10 mm UPJ stone with febrile urinary tract infection.  Objective: Vital signs in last 24 hours: Temp:  [97.9 F (36.6 C)-98.9 F (37.2 C)] 98.9 F (37.2 C) (04/12 0544) Pulse Rate:  [67-95] 95 (04/12 0544) Resp:  [16] 16 (04/12 0544) BP: (123-139)/(65-94) 137/94 mmHg (04/12 0544) SpO2:  [95 %-100 %] 95 % (04/12 0544)  Intake/Output from previous day: 04/11 0701 - 04/12 0700 In: 668.3 [P.O.:240; I.V.:428.3] Out: 600 [Urine:600] Intake/Output this shift:    Physical Exam:  Constitutional: Vital signs reviewed. WD WN in NAD   Eyes: PERRL, No scleral icterus.   Cardiovascular: RRR Pulmonary/Chest: Normal effort Abdominal: Soft. Non-tender, non-distended, bowel sounds are normal, no masses, organomegaly, or guarding present.  Genitourinary: Not examined Extremities: No cyanosis or edema   Lab Results:  Recent Labs  04/05/16 1557 04/06/16 0459 04/07/16 0503  HGB 14.1 12.1* 12.5*  HCT 42.6 36.3* 38.6*   BMET  Recent Labs  04/06/16 0459 04/07/16 0503  NA 144 146*  K 3.4* 3.6  CL 115* 114*  CO2 21* 24  GLUCOSE 162* 144*  BUN 25* 23*  CREATININE 1.25* 1.13  CALCIUM 8.2* 8.7*   No results for input(s): LABPT, INR in the last 72 hours. No results for input(s): LABURIN in the last 72 hours. Results for orders placed or performed during the hospital encounter of 04/05/16  Urine culture     Status: Abnormal   Collection Time: 04/05/16  3:15 PM  Result Value Ref Range Status   Specimen Description URINE, CLEAN CATCH  Final   Special Requests NONE  Final   Culture (A)  Final    5,000 COLONIES/mL INSIGNIFICANT GROWTH Performed at [facility]    Report Status 04/07/2016 FINAL  Final  Blood Culture (routine x 2)     Status: None (Preliminary result)   Collection Time: 04/05/16  3:57 PM  Result Value Ref Range Status   Specimen Description BLOOD LEFT ANTECUBITAL  Final   Special Requests BOTTLES DRAWN AEROBIC AND ANAEROBIC  5ML  Final   Culture   Final    NO GROWTH < 24 HOURS Performed at [facility]    Report Status PENDING  Incomplete  Blood Culture (routine x 2)     Status: None (Preliminary result)   Collection Time: 04/05/16  4:02 PM  Result Value Ref Range Status   Specimen Description BLOOD RIGHT ANTECUBITAL  Final   Special Requests BOTTLES DRAWN AEROBIC AND ANAEROBIC 5 ML  Final   Culture   Final    NO GROWTH < 24 HOURS Performed at [facility]    Report Status PENDING  Incomplete  Surgical PCR screen     Status: Abnormal   Collection Time: 04/05/16 11:03 PM  Result Value Ref Range Status   MRSA, PCR NEGATIVE NEGATIVE Final   Staphylococcus aureus POSITIVE (A) NEGATIVE Final    Comment:        The Xpert SA Assay (FDA approved for NASAL specimens in patients over 69 years of age), is one component of a comprehensive surveillance program.  Test performance has been validated by [facility] for patients greater than or equal to 110 year old. It is not intended to diagnose infection nor to guide or monitor treatment.     Studies/Results: Ct Abdomen Pelvis Wo Contrast  04/05/2016  CLINICAL DATA:  Right flank pain since Sunday.  Fever. EXAM: CT ABDOMEN AND PELVIS WITHOUT CONTRAST TECHNIQUE: Multidetector CT imaging of the abdomen and pelvis was performed following the standard protocol without IV contrast. COMPARISON:  04/23/2014 FINDINGS: Lower chest: Small right pleural effusion and overlying atelectasis. The left lung base is clear. The heart is normal in size. No pericardial effusion. The distal esophagus is grossly normal. Hepatobiliary: Simple hepatic cyst. No worrisome hepatic lesions or intrahepatic biliary dilatation. The gallbladder is grossly normal. Pancreas: No mass, inflammation or ductal dilatation. Spleen: Normal size.  No focal lesions. Adrenals/Urinary  Tract: Stable moderate nodularity of both adrenal glands, likely benign adenomas. Moderate-to-marked right-sided hydronephrosis due to a large obstructing UPJ calculus measuring 10 x 10 mm. Smaller renal calculi are noted. Perinephric fluid surrounding the right kidney in tracking down along the right ureter and into the extraperitoneal pelvis consistent with high-grade obstruction. Multiple small left-sided renal calculi but no obstructing ureteral calculi or bladder calculi. Small hyperdense/ hemorrhagic cysts are noted bilaterally. Stomach/Bowel: The stomach, duodenum, small bowel and colon are grossly normal. No inflammatory changes, mass lesions or obstructive findings. The terminal ileum is normal. The appendix is normal. Vascular/Lymphatic: Moderate atherosclerotic calcifications involving the aorta and branch vessel ostia. No aneurysm. Small scattered mesenteric and retroperitoneal lymph nodes but no mass or adenopathy. Other: No pelvic mass or adenopathy. No free pelvic fluid collections. No inguinal mass are adenopathy. There is a left inguinal hernia containing fat. Musculoskeletal: No significant bony findings. Degenerative changes involving the spine and hips. IMPRESSION: 1. High-grade obstruction of the right kidney by a 10 x 10 mm UPJ calculus. 2. Bilateral renal calculi. 3. Small right pleural effusion with overlying atelectasis. 4. Moderate atherosclerotic calcifications involving the aorta and branch vessel ostia. No aneurysm. Electronically Signed   By: Marijo Sanes M.D.   On: 04/05/2016 18:36   Dg Chest 2 View  04/05/2016  CLINICAL DATA:  Fever for 2 days EXAM: CHEST  2 VIEW COMPARISON:  09/23/2015 FINDINGS: Linear opacity at the right base most consistent with atelectasis. No lobar pneumonia, edema, effusion, or pneumothorax. Normal heart size and stable mediastinal contours. IMPRESSION: Right perihilar atelectasis. In the setting of fever this could reflect airway infection. No  consolidating pneumonia. Electronically Signed   By: Monte Fantasia M.D.   On: 04/05/2016 16:19    Assessment/Plan:   10 x 10 mm right UPJ stone with febrile urinary tract infection. Normally we would convert to oral antibiotics when culture results are available but there appears to be insignificant growth on his urine culture. Antibiotic choice will need to be empiric. Septra double strength twice a day 2 weeks would be reasonable. Amoxicillin or Cipro for also reasonable options. This stone will be amenable to ESWL once the infection is resolved. We would plan on follow-up in our office in about one week and hopefully scheduling ESWL in 10-14 days. Urologically the patient appears to be ready for discharge today or tomorrow. We'll ask our clinic to start working on arranging follow-up and ESWL dates.   LOS: 2 days   Cope Marte S 04/07/2016, 12:52 PM

## 2016-04-10 LAB — CULTURE, BLOOD (ROUTINE X 2)
Culture: NO GROWTH
Culture: NO GROWTH

## 2016-04-12 ENCOUNTER — Other Ambulatory Visit: Payer: Self-pay | Admitting: Urology

## 2016-04-13 DIAGNOSIS — J9600 Acute respiratory failure, unspecified whether with hypoxia or hypercapnia: Secondary | ICD-10-CM

## 2016-04-19 DIAGNOSIS — N201 Calculus of ureter: Secondary | ICD-10-CM | POA: Diagnosis not present

## 2016-04-19 DIAGNOSIS — Z0000 Encounter for general adult medical examination without abnormal findings: Secondary | ICD-10-CM | POA: Diagnosis not present

## 2016-04-20 ENCOUNTER — Encounter (HOSPITAL_COMMUNITY): Payer: Self-pay | Admitting: *Deleted

## 2016-04-23 DIAGNOSIS — Z6829 Body mass index (BMI) 29.0-29.9, adult: Secondary | ICD-10-CM | POA: Diagnosis not present

## 2016-04-23 DIAGNOSIS — N200 Calculus of kidney: Secondary | ICD-10-CM | POA: Diagnosis not present

## 2016-04-23 DIAGNOSIS — N12 Tubulo-interstitial nephritis, not specified as acute or chronic: Secondary | ICD-10-CM | POA: Diagnosis not present

## 2016-04-26 ENCOUNTER — Encounter (HOSPITAL_COMMUNITY)
Admission: RE | Disposition: A | Discharge status: Home / Self Care | Payer: Self-pay | Source: Ambulatory Visit | Attending: Urology

## 2016-04-26 ENCOUNTER — Ambulatory Visit (HOSPITAL_COMMUNITY)
Admission: RE | Admit: 2016-04-26 | Discharge: 2016-04-26 | Disposition: A | Discharge status: Home / Self Care | Payer: Medicare Other | Source: Ambulatory Visit | Attending: Urology | Admitting: Urology

## 2016-04-26 ENCOUNTER — Encounter (HOSPITAL_COMMUNITY): Payer: Self-pay | Admitting: General Practice

## 2016-04-26 ENCOUNTER — Ambulatory Visit (HOSPITAL_COMMUNITY): Payer: Medicare Other

## 2016-04-26 DIAGNOSIS — Z87442 Personal history of urinary calculi: Secondary | ICD-10-CM | POA: Insufficient documentation

## 2016-04-26 DIAGNOSIS — N202 Calculus of kidney with calculus of ureter: Secondary | ICD-10-CM | POA: Diagnosis not present

## 2016-04-26 DIAGNOSIS — Z87891 Personal history of nicotine dependence: Secondary | ICD-10-CM | POA: Insufficient documentation

## 2016-04-26 DIAGNOSIS — N200 Calculus of kidney: Secondary | ICD-10-CM | POA: Diagnosis not present

## 2016-04-26 DIAGNOSIS — Z01818 Encounter for other preprocedural examination: Secondary | ICD-10-CM | POA: Diagnosis not present

## 2016-04-26 SURGERY — LITHOTRIPSY, ESWL
Anesthesia: LOCAL | Laterality: Right

## 2016-04-26 MED ORDER — DEXTROSE 5 % IV SOLN
2.0000 g | Freq: Once | INTRAVENOUS | Status: AC
  Administered 2016-04-26: 2 g via INTRAVENOUS
  Filled 2016-04-26: qty 2

## 2016-04-26 MED ORDER — SODIUM CHLORIDE 0.9 % IV SOLN
INTRAVENOUS | Status: DC
  Administered 2016-04-26: 10:00:00 via INTRAVENOUS

## 2016-04-26 MED ORDER — DIAZEPAM 5 MG PO TABS
10.0000 mg | ORAL_TABLET | ORAL | Status: AC
  Administered 2016-04-26: 10 mg via ORAL
  Filled 2016-04-26: qty 2

## 2016-04-26 MED ORDER — DIPHENHYDRAMINE HCL 25 MG PO CAPS
25.0000 mg | ORAL_CAPSULE | ORAL | Status: AC
  Administered 2016-04-26: 25 mg via ORAL
  Filled 2016-04-26: qty 1

## 2016-04-26 NOTE — H&P (Signed)
Urology History and Physical Exam  CC: Right kidney stone  HPI: 70 year old male presents for left ESL for mgmt of a right renal stone. He originally presented in early April 17 with an infected, obstructed stonene. He underwent urgent stenting. He presents now for litho. PMH: Past Medical History  Diagnosis Date  . Hernia 01/2013  . Kidney stone 2008  . GERD (gastroesophageal reflux disease)   . H/O hiatal hernia     PSH: Past Surgical History  Procedure Laterality Date  . Orchiectomy Right 1971  . Knee arthroscopy Left 2008  . Hernia repair      left  . Teeth pulled  2010  . Extracorporeal shock wave lithotripsy Right 2014  . Cystoscopy with stent placement Left 04/16/2014    Procedure: CYSTOSCOPY WITH STENT PLACEMENT;  Surgeon: Irine Seal, MD;  Location: [facility];  Service: Urology;  Laterality: Left;  . Cystoscopy with retrograde pyelogram, ureteroscopy and stent placement Right 04/05/2016    Procedure: CYSTOSCOPY WITH RETROGRADE PYELOGRAM, URETEROSCOPY AND STENT PLACEMENT;  Surgeon: Franchot Gallo, MD;  Location: [facility];  Service: Urology;  Laterality: Right;    Allergies: Allergies  Allergen Reactions  . Peanut-Containing Drug Products Anaphylaxis    REACTION: Patient has Epipen to treat this- anaphylaxis shock  . Novocain [Procaine Hcl] Itching and Rash    Low grade fever    Medications: No prescriptions prior to admission     Social History: Social History   Social History  . Marital Status: Married    Spouse Name: N/A  . Number of Children: N/A  . Years of Education: N/A   Occupational History  . Not on file.   Social History Main Topics  . Smoking status: Former Smoker -- 1.50 packs/day    Types: Cigarettes, Pipe, Cigars    Quit date: 11/28/2009  . Smokeless tobacco: Not on file  . Alcohol Use: 0.6 oz/week    1 Glasses of wine per week  . Drug Use: No  . Sexual Activity: No   Other Topics Concern  . Not on file   Social History Narrative     Family History: History reviewed. No pertinent family history.  Review of Systems: Genitourinary, constitutional, skin, eye, otolaryngeal, hematologic/lymphatic, cardiovascular, pulmonary, endocrine, musculoskeletal, gastrointestinal, neurological and psychiatric system(s) were reviewed and pertinent findings if present are noted and are otherwise negative.  Genitourinary: post-void dribbling and erectile dysfunction, but no hematuria.  Gastrointestinal: no flank pain and no abdominal pain.  Constitutional: no fever.  Musculoskeletal: no back pain.                     Physical Exam: @VITALS2 @ General: No acute distress.  Awake. Head:  Normocephalic.  Atraumatic. ENT:  EOMI.  Mucous membranes moist Neck:  Supple.  No lymphadenopathy. CV:  S1 present. S2 present. Regular rate. Pulmonary: Equal effort bilaterally.  Clear to auscultation bilaterally. Abdomen: Soft.  Non tender to palpation. Skin:  Normal turgor.  No visible rash. Extremity: No gross deformity of bilateral upper extremities.  No gross deformity of                             lower extremities. Neurologic: Alert. Appropriate mood.    Studies:  No results for input(s): HGB, WBC, PLT in the last 72 hours.  No results for input(s): NA, K, CL, CO2, BUN, CREATININE, CALCIUM, GFRNONAA, GFRAA in the last 72 hours.  Invalid input(s): MAGNESIUM  No results for input(s): INR, APTT in the last 72 hours.  Invalid input(s): PT   Invalid input(s): ABG    Assessment:  Right renal sentingtone s/p st  Plan: Rt ESWL

## 2016-04-26 NOTE — Op Note (Signed)
See [facility] OP note scanned into chart. 

## 2016-04-26 NOTE — Discharge Instructions (Signed)
See [facility] discharge instructions in chart.  

## 2016-05-18 DIAGNOSIS — Z0000 Encounter for general adult medical examination without abnormal findings: Secondary | ICD-10-CM | POA: Diagnosis not present

## 2016-05-18 DIAGNOSIS — N200 Calculus of kidney: Secondary | ICD-10-CM | POA: Diagnosis not present

## 2016-05-18 DIAGNOSIS — N201 Calculus of ureter: Secondary | ICD-10-CM | POA: Diagnosis not present

## 2016-05-27 IMAGING — CR DG CHEST 2V
2 series · 2 of 2 positions shown · non-contrast
Comparison: 08/27/2015

CLINICAL DATA: Cough and fever.  Fever for 1 month.

EXAM:
CHEST  2 VIEW

[w chest pa]
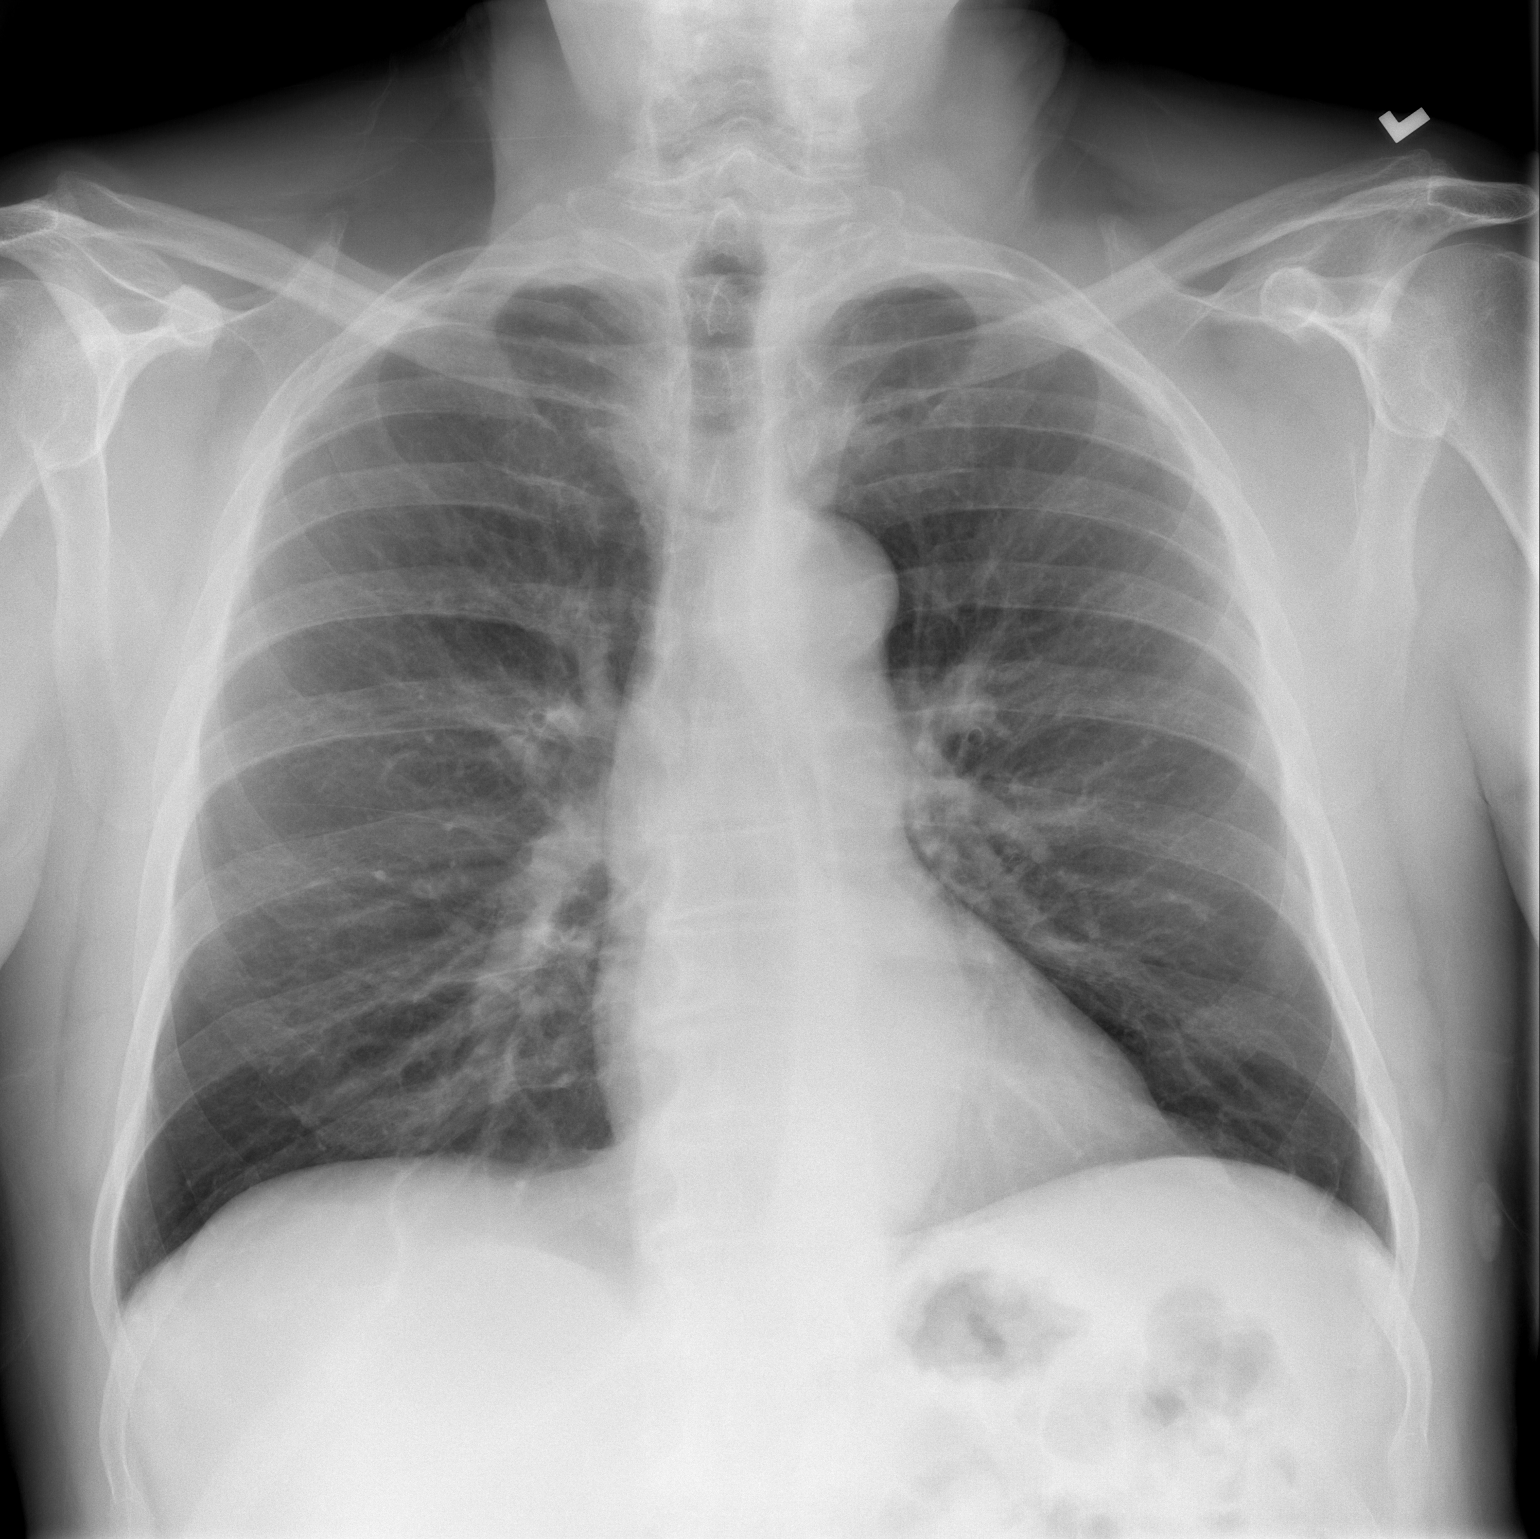

[w chest lat]
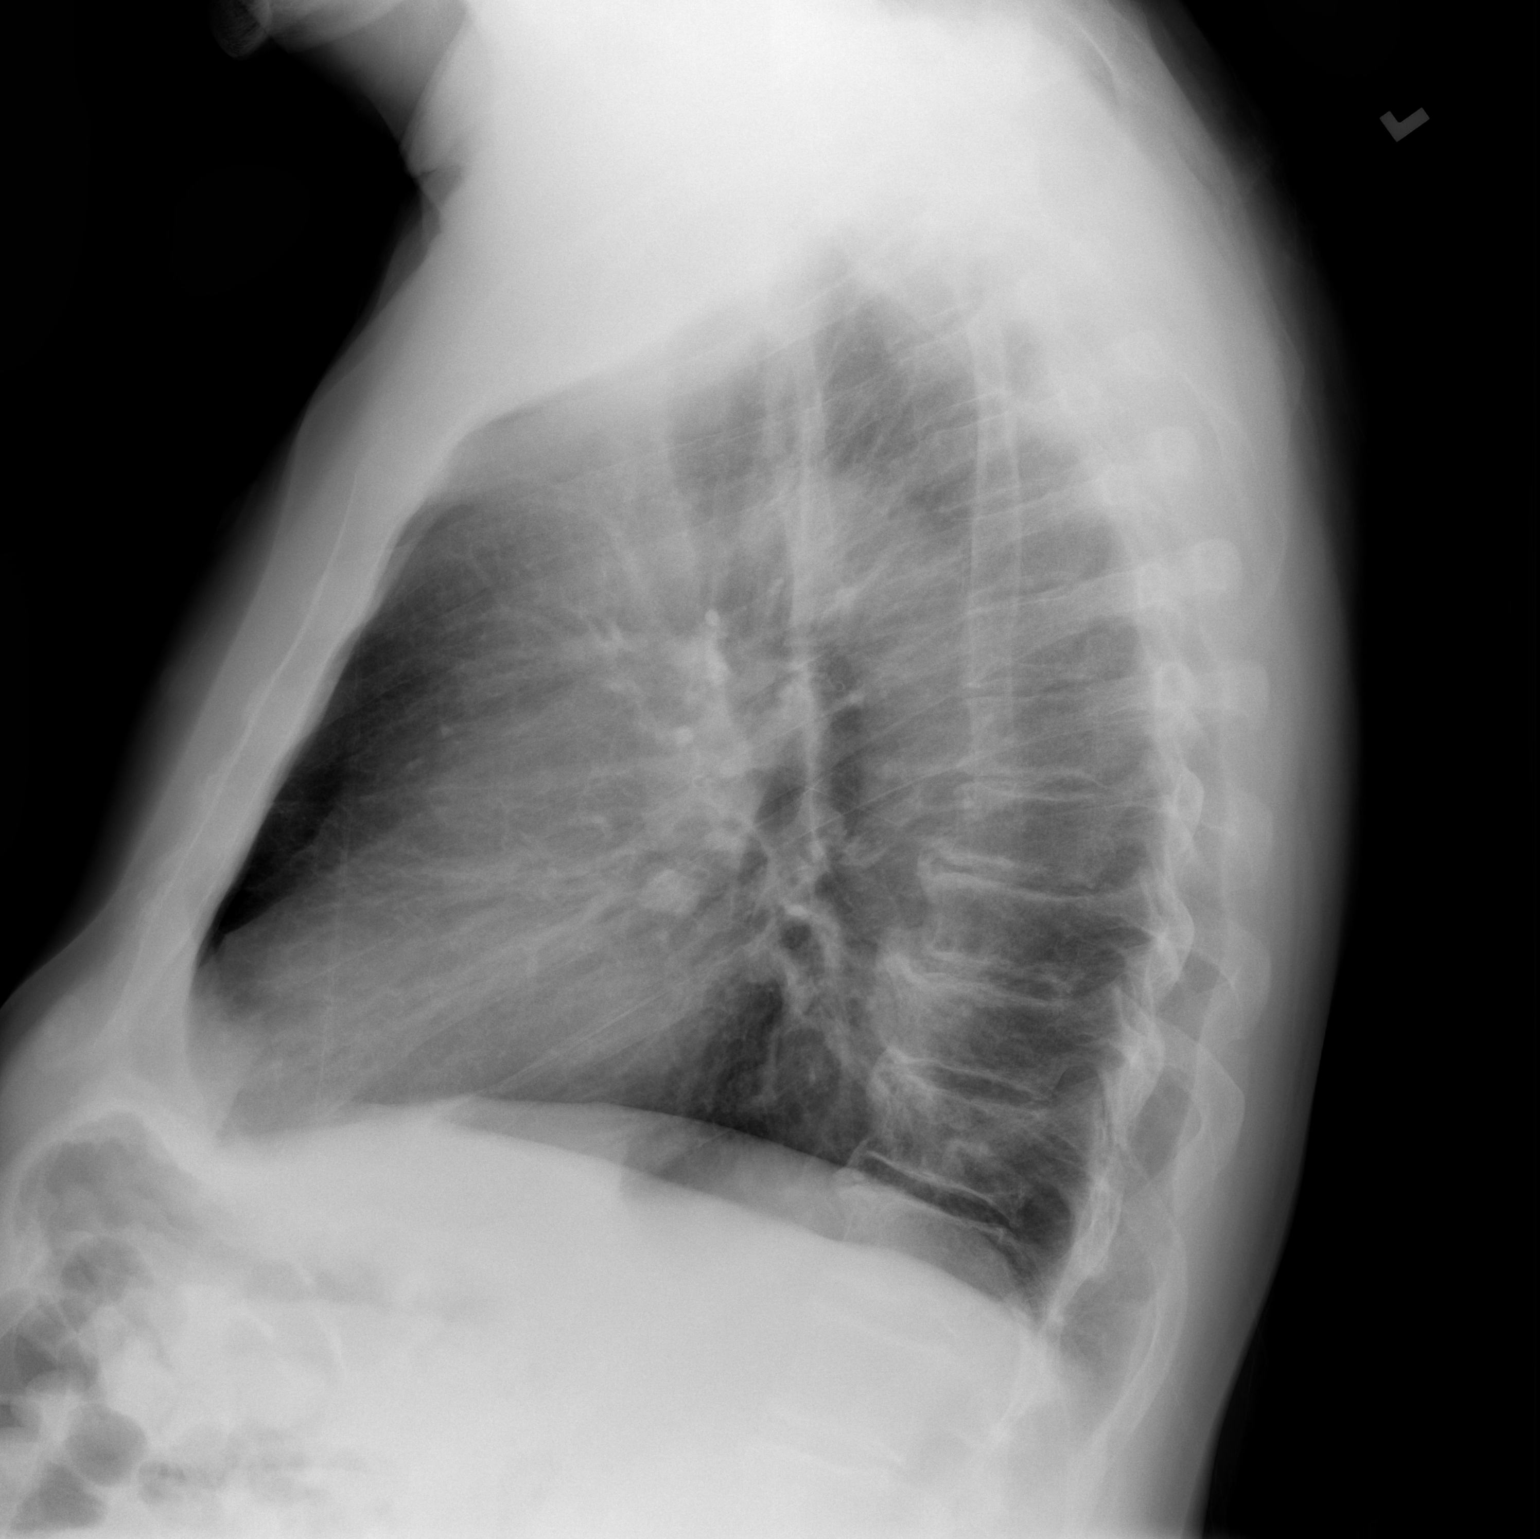

[2 of 2 positions shown; findings below may reference images not displayed]

FINDINGS: The lungs are hyperinflated. The cardiomediastinal contours are
normal. Minimal right basilar atelectasis. Pulmonary vasculature is
normal. No consolidation, pleural effusion, or pneumothorax. No
acute osseous abnormalities are seen.
IMPRESSION: Stable hyperinflation. Minimal right basilar atelectasis. No
consolidation to suggest pneumonia.

## 2016-05-31 ENCOUNTER — Other Ambulatory Visit: Payer: Self-pay | Admitting: Urology

## 2016-06-09 NOTE — Patient Instructions (Addendum)
Eddie Walker  06/09/2016   Your procedure is scheduled on: 06/14/2016    Report to [facility] Main  Entrance take [place]  elevators to 3rd floor to  [place] at    0800 AM.  Call this number if you have problems the morning of surgery [phone]   Remember: ONLY 1 PERSON MAY GO WITH YOU TO SHORT STAY TO GET  READY MORNING OF [place].  Do not eat food or drink liquids :After Midnight.     Take these medicines the morning of surgery with A SIP OF WATER: Omeprazole ( Prilosec), Avodart, Proscar                                 You may not have any metal on your body including hair pins and              piercings  Do not wear jewelry, , powders or perfumes, deodorant                      Men may shave face and neck.   Do not bring valuables to the hospital. [place].  Contacts, dentures or bridgework may not be worn into surgery.      Patients discharged the day of surgery will not be allowed to drive home.                Please read over the following fact sheets you were given: _____________________________________________________________________             [facility] - Preparing for Surgery Before surgery, you can play an important role.  Because skin is not sterile, your skin needs to be as free of germs as possible.  You can reduce the number of germs on your skin by washing with CHG (chlorahexidine gluconate) soap before surgery.  CHG is an antiseptic cleaner which kills germs and bonds with the skin to continue killing germs even after washing. Please DO NOT use if you have an allergy to CHG or antibacterial soaps.  If your skin becomes reddened/irritated stop using the CHG and inform your nurse when you arrive at Short Stay. Do not shave (including legs and underarms) for at least 48 hours prior to the first CHG shower.  You may shave your face/neck. Please follow these  instructions carefully:  1.  Shower with CHG Soap the night before surgery and the  morning of Surgery.  2.  If you choose to wash your hair, wash your hair first as usual with your  normal  shampoo.  3.  After you shampoo, rinse your hair and body thoroughly to remove the  shampoo.                           4.  Use CHG as you would any other liquid soap.  You can apply chg directly  to the skin and wash                       Gently with a scrungie or clean washcloth.  5.  Apply the CHG Soap to your body ONLY FROM THE NECK DOWN.  Do not use on face/ open                           Wound or open sores. Avoid contact with eyes, ears mouth and genitals (private parts).                       Wash face,  Genitals (private parts) with your normal soap.             6.  Wash thoroughly, paying special attention to the area where your surgery  will be performed.  7.  Thoroughly rinse your body with warm water from the neck down.  8.  DO NOT shower/wash with your normal soap after using and rinsing off  the CHG Soap.                9.  Pat yourself dry with a clean towel.            10.  Wear clean pajamas.            11.  Place clean sheets on your bed the night of your first shower and do not  sleep with pets. Day of Surgery : Do not apply any lotions/deodorants the morning of surgery.  Please wear clean clothes to the hospital/surgery center.  FAILURE TO FOLLOW THESE INSTRUCTIONS MAY RESULT IN THE CANCELLATION OF YOUR SURGERY PATIENT SIGNATURE_________________________________  NURSE SIGNATURE__________________________________  ________________________________________________________________________

## 2016-06-11 ENCOUNTER — Encounter (HOSPITAL_COMMUNITY)
Admission: RE | Admit: 2016-06-11 | Discharge: 2016-06-11 | Disposition: A | Discharge status: Home / Self Care | Payer: Medicare Other | Source: Ambulatory Visit | Attending: Urology | Admitting: Urology

## 2016-06-11 ENCOUNTER — Encounter (HOSPITAL_COMMUNITY): Payer: Self-pay

## 2016-06-11 DIAGNOSIS — T83123A Displacement of other urinary stents, initial encounter: Secondary | ICD-10-CM | POA: Diagnosis not present

## 2016-06-11 DIAGNOSIS — M1990 Unspecified osteoarthritis, unspecified site: Secondary | ICD-10-CM | POA: Diagnosis not present

## 2016-06-11 DIAGNOSIS — Y838 Other surgical procedures as the cause of abnormal reaction of the patient, or of later complication, without mention of misadventure at the time of the procedure: Secondary | ICD-10-CM | POA: Diagnosis not present

## 2016-06-11 DIAGNOSIS — N201 Calculus of ureter: Secondary | ICD-10-CM | POA: Diagnosis not present

## 2016-06-11 DIAGNOSIS — Z87891 Personal history of nicotine dependence: Secondary | ICD-10-CM | POA: Diagnosis not present

## 2016-06-11 HISTORY — DX: Unspecified osteoarthritis, unspecified site: M19.90

## 2016-06-11 LAB — CBC
HEMATOCRIT: 41.7 % (ref 39.0–52.0)
Hemoglobin: 14.2 g/dL (ref 13.0–17.0)
MCH: 30.7 pg (ref 26.0–34.0)
MCHC: 34.1 g/dL (ref 30.0–36.0)
MCV: 90.3 fL (ref 78.0–100.0)
Platelets: 224 10*3/uL (ref 150–400)
RBC: 4.62 MIL/uL (ref 4.22–5.81)
RDW: 14 % (ref 11.5–15.5)
WBC: 8 10*3/uL (ref 4.0–10.5)

## 2016-06-11 LAB — BASIC METABOLIC PANEL
Anion gap: 7 (ref 5–15)
BUN: 17 mg/dL (ref 6–20)
CHLORIDE: 106 mmol/L (ref 101–111)
CO2: 28 mmol/L (ref 22–32)
Calcium: 9.3 mg/dL (ref 8.9–10.3)
Creatinine, Ser: 0.92 mg/dL (ref 0.61–1.24)
GFR calc Af Amer: >60 mL/min (ref >60)
GFR calc non Af Amer: >60 mL/min (ref >60)
GLUCOSE: 126 mg/dL — AB (ref 65–99)
POTASSIUM: 4.1 mmol/L (ref 3.5–5.1)
Sodium: 141 mmol/L (ref 135–145)

## 2016-06-11 NOTE — Pre-Procedure Instructions (Addendum)
EKG 09-24-15 epic 2V CXR 04-05-16 epic - spoke with Dr. Ola Spurr regarding pt's chest x-ray. Orders to proceed with appointment and surgery unless pt is showing signs/symptoms of infection.

## 2016-06-13 NOTE — H&P (Signed)
Urology History and Physical Exam  CC: Migrated stent  HPI: 70 year old male presents for extraction of a migrated ureteral stent, placed in May '17 after right uretroscopic stone extraction  PMH: Past Medical History  Diagnosis Date  . Hernia 01/2013  . Kidney stone 2008  . GERD (gastroesophageal reflux disease)   . H/O hiatal hernia   . Arthritis     PSH: Past Surgical History  Procedure Laterality Date  . Orchiectomy Right 1971  . Knee arthroscopy Left 2008  . Hernia repair      left  . Teeth pulled  2010  . Extracorporeal shock wave lithotripsy Right 2014  . Cystoscopy with stent placement Left 04/16/2014    Procedure: CYSTOSCOPY WITH STENT PLACEMENT;  Surgeon: Irine Seal, MD;  Location: [facility];  Service: Urology;  Laterality: Left;  . Cystoscopy with retrograde pyelogram, ureteroscopy and stent placement Right 04/05/2016    Procedure: CYSTOSCOPY WITH RETROGRADE PYELOGRAM, URETEROSCOPY AND STENT PLACEMENT;  Surgeon: Franchot Gallo, MD;  Location: [facility];  Service: Urology;  Laterality: Right;    Allergies: Allergies  Allergen Reactions  . Peanut-Containing Drug Products Anaphylaxis and Other (See Comments)    Patient has Epipen to treat this- anaphylaxis shock  . Novocain [Procaine Hcl] Itching, Rash and Other (See Comments)    Low grade fever    Medications: No prescriptions prior to admission     Social History: Social History   Social History  . Marital Status: Married    Spouse Name: N/A  . Number of Children: N/A  . Years of Education: N/A   Occupational History  . Not on file.   Social History Main Topics  . Smoking status: Former Smoker -- 1.50 packs/day    Types: Cigarettes, Pipe, Cigars    Quit date: 11/28/2009  . Smokeless tobacco: Never Used  . Alcohol Use: 0.6 oz/week    1 Glasses of wine per week  . Drug Use: No  . Sexual Activity: No   Other Topics Concern  . Not on file   Social History Narrative    Family History: No  family history on file.  Review of Systems: Positive: N/A Negative:   A further 10 point review of systems was negative except what is listed in the HPI.                  Physical Exam: @VITALS2 @ General: No acute distress.  Awake. Head:  Normocephalic.  Atraumatic. ENT:  EOMI.  Mucous membranes moist Neck:  Supple.  No lymphadenopathy. CV:  S1 present. S2 present. Regular rate. Pulmonary: Equal effort bilaterally.  Clear to auscultation bilaterally. Abdomen: Soft.  Non tender to palpation. Skin:  Normal turgor.  No visible rash. Extremity: No gross deformity of bilateral upper extremities.  No gross deformity of                             lower extremities. Neurologic: Alert. Appropriate mood.   Studies:  Recent Labs     06/11/16  0930  HGB  14.2  WBC  8.0  PLT  224    Recent Labs     06/11/16  0930  NA  141  K  4.1  CL  106  CO2  28  BUN  17  CREATININE  0.92  CALCIUM  9.3  GFRNONAA  >60  GFRAA  >60     No results for input(s): INR, APTT in the last  72 hours.  Invalid input(s): PT   Invalid input(s): ABG    Assessment:  Migrated right ureteral stent  Plan: Cystoscopy, extraction of right ureteral stent

## 2016-06-14 ENCOUNTER — Encounter (HOSPITAL_COMMUNITY)
Admission: RE | Disposition: A | Discharge status: Home / Self Care | Payer: Self-pay | Source: Ambulatory Visit | Attending: Urology

## 2016-06-14 ENCOUNTER — Ambulatory Visit (HOSPITAL_COMMUNITY)
Admission: RE | Admit: 2016-06-14 | Discharge: 2016-06-14 | Disposition: A | Discharge status: Home / Self Care | Payer: Medicare Other | Source: Ambulatory Visit | Attending: Urology | Admitting: Urology

## 2016-06-14 ENCOUNTER — Ambulatory Visit (HOSPITAL_COMMUNITY): Payer: Medicare Other | Admitting: Anesthesiology

## 2016-06-14 ENCOUNTER — Encounter (HOSPITAL_COMMUNITY): Payer: Self-pay | Admitting: *Deleted

## 2016-06-14 DIAGNOSIS — Z87891 Personal history of nicotine dependence: Secondary | ICD-10-CM | POA: Diagnosis not present

## 2016-06-14 DIAGNOSIS — N201 Calculus of ureter: Secondary | ICD-10-CM | POA: Insufficient documentation

## 2016-06-14 DIAGNOSIS — T198XXA Foreign body in other parts of genitourinary tract, initial encounter: Secondary | ICD-10-CM | POA: Diagnosis not present

## 2016-06-14 DIAGNOSIS — Y838 Other surgical procedures as the cause of abnormal reaction of the patient, or of later complication, without mention of misadventure at the time of the procedure: Secondary | ICD-10-CM | POA: Insufficient documentation

## 2016-06-14 DIAGNOSIS — M1990 Unspecified osteoarthritis, unspecified site: Secondary | ICD-10-CM | POA: Diagnosis not present

## 2016-06-14 DIAGNOSIS — T83123A Displacement of other urinary stents, initial encounter: Secondary | ICD-10-CM | POA: Diagnosis not present

## 2016-06-14 DIAGNOSIS — T83122A Displacement of urinary stent, initial encounter: Secondary | ICD-10-CM | POA: Diagnosis not present

## 2016-06-14 HISTORY — PX: CYSTOSCOPY WITH URETEROSCOPY AND STENT PLACEMENT: SHX6377

## 2016-06-14 SURGERY — CYSTOURETEROSCOPY, WITH STENT INSERTION
Anesthesia: General | Laterality: Right

## 2016-06-14 MED ORDER — CEFAZOLIN SODIUM-DEXTROSE 2-4 GM/100ML-% IV SOLN
INTRAVENOUS | Status: AC
  Filled 2016-06-14: qty 100

## 2016-06-14 MED ORDER — LABETALOL HCL 5 MG/ML IV SOLN
10.0000 mg | INTRAVENOUS | Status: AC | PRN
  Administered 2016-06-14 (×2): 5 mg via INTRAVENOUS

## 2016-06-14 MED ORDER — CEPHALEXIN 500 MG PO CAPS: 500.0000 mg | ORAL_CAPSULE | Freq: Two times a day (BID) | ORAL | Status: DC

## 2016-06-14 MED ORDER — PROPOFOL 10 MG/ML IV BOLUS
INTRAVENOUS | Status: AC
  Filled 2016-06-14: qty 20

## 2016-06-14 MED ORDER — DEXAMETHASONE SODIUM PHOSPHATE 10 MG/ML IJ SOLN
INTRAMUSCULAR | Status: DC | PRN
  Administered 2016-06-14: 10 mg via INTRAVENOUS

## 2016-06-14 MED ORDER — PROMETHAZINE HCL 25 MG/ML IJ SOLN: 6.2500 mg | INTRAMUSCULAR | Status: DC | PRN

## 2016-06-14 MED ORDER — LACTATED RINGERS IV SOLN
INTRAVENOUS | Status: DC
  Administered 2016-06-14: 1000 mL via INTRAVENOUS

## 2016-06-14 MED ORDER — CEFAZOLIN SODIUM-DEXTROSE 2-4 GM/100ML-% IV SOLN
2.0000 g | INTRAVENOUS | Status: AC
  Administered 2016-06-14: 2 g via INTRAVENOUS
  Filled 2016-06-14: qty 100

## 2016-06-14 MED ORDER — LIDOCAINE HCL (CARDIAC) 20 MG/ML IV SOLN
INTRAVENOUS | Status: AC
  Filled 2016-06-14: qty 5

## 2016-06-14 MED ORDER — ONDANSETRON HCL 4 MG/2ML IJ SOLN
INTRAMUSCULAR | Status: DC | PRN
  Administered 2016-06-14: 4 mg via INTRAVENOUS

## 2016-06-14 MED ORDER — PROPOFOL 10 MG/ML IV BOLUS
INTRAVENOUS | Status: DC | PRN
  Administered 2016-06-14: 170 mg via INTRAVENOUS

## 2016-06-14 MED ORDER — SODIUM CHLORIDE 0.9 % IR SOLN
Status: DC | PRN
  Administered 2016-06-14: 3000 mL

## 2016-06-14 MED ORDER — HYDROMORPHONE HCL 1 MG/ML IJ SOLN
0.2500 mg | INTRAMUSCULAR | Status: DC | PRN
Start: 2016-06-14 — End: 2016-06-14

## 2016-06-14 MED ORDER — FENTANYL CITRATE (PF) 100 MCG/2ML IJ SOLN
INTRAMUSCULAR | Status: AC
  Filled 2016-06-14: qty 2

## 2016-06-14 MED ORDER — DEXAMETHASONE SODIUM PHOSPHATE 10 MG/ML IJ SOLN
INTRAMUSCULAR | Status: AC
  Filled 2016-06-14: qty 1

## 2016-06-14 MED ORDER — ONDANSETRON HCL 4 MG/2ML IJ SOLN
INTRAMUSCULAR | Status: AC
  Filled 2016-06-14: qty 2

## 2016-06-14 MED ORDER — FENTANYL CITRATE (PF) 100 MCG/2ML IJ SOLN
INTRAMUSCULAR | Status: DC | PRN
  Administered 2016-06-14 (×2): 50 ug via INTRAVENOUS

## 2016-06-14 SURGICAL SUPPLY — 21 items
BAG URO CATCHER STRL LF (MISCELLANEOUS) ×3 IMPLANT
BASKET LASER NITINOL 1.9FR (BASKET) ×1 IMPLANT
BASKET ZERO TIP NITINOL 2.4FR (BASKET) IMPLANT
BSKT STON RTRVL 120 1.9FR (BASKET)
BSKT STON RTRVL ZERO TP 2.4FR (BASKET)
CATH INTERMIT  6FR 70CM (CATHETERS) ×2 IMPLANT
CLOTH BEACON ORANGE TIMEOUT ST (SAFETY) ×3 IMPLANT
FIBER LASER FLEXIVA 365 (UROLOGICAL SUPPLIES) IMPLANT
FIBER LASER TRAC TIP (UROLOGICAL SUPPLIES) IMPLANT
GLOVE BIOGEL M 8.0 STRL (GLOVE) ×3 IMPLANT
GOWN STRL REUS W/ TWL XL LVL3 (GOWN DISPOSABLE) ×1 IMPLANT
GOWN STRL REUS W/TWL LRG LVL3 (GOWN DISPOSABLE) ×2 IMPLANT
GOWN STRL REUS W/TWL XL LVL3 (GOWN DISPOSABLE) ×9
GUIDEWIRE ANG ZIPWIRE 038X150 (WIRE) ×1 IMPLANT
GUIDEWIRE STR DUAL SENSOR (WIRE) ×3 IMPLANT
IV NS 1000ML (IV SOLUTION)
IV NS 1000ML BAXH (IV SOLUTION) ×1 IMPLANT
MANIFOLD NEPTUNE II (INSTRUMENTS) ×3 IMPLANT
PACK CYSTO (CUSTOM PROCEDURE TRAY) ×3 IMPLANT
TUBING CONNECTING 10 (TUBING) ×2 IMPLANT
TUBING CONNECTING 10' (TUBING) ×1

## 2016-06-14 NOTE — Transfer of Care (Signed)
Immediate Anesthesia Transfer of Care Note  Patient: Eddie Walker  Procedure(s) Performed: Procedure(s): CYSTOSCOPY WITH RIGHT URETEROSCOPIC STENT EXTRACTION (Right)  Patient Location: PACU  Anesthesia Type:General  Level of Consciousness:  sedated, patient cooperative and responds to stimulation  Airway & Oxygen Therapy:Patient Spontanous Breathing and Patient connected to face mask oxgen  Post-op Assessment:  Report given to PACU RN and Post -op Vital signs reviewed and stable  Post vital signs:  Reviewed and stable  Last Vitals:  Filed Vitals:   06/14/16 0752 06/14/16 0805  BP: 183/100 157/96  Pulse: 77   Temp: 36.8 C   Resp: 16     Complications: No apparent anesthesia complications

## 2016-06-14 NOTE — Discharge Instructions (Signed)

## 2016-06-14 NOTE — Anesthesia Postprocedure Evaluation (Signed)
Anesthesia Post Note  Patient: Eddie Walker  Procedure(s) Performed: Procedure(s) (LRB): CYSTOSCOPY WITH RIGHT URETEROSCOPIC STENT EXTRACTION (Right)  Patient location during evaluation: PACU Anesthesia Type: General Level of consciousness: awake and alert Pain management: pain level controlled Vital Signs Assessment: post-procedure vital signs reviewed and stable Respiratory status: spontaneous breathing, nonlabored ventilation, respiratory function stable and patient connected to nasal cannula oxygen Cardiovascular status: blood pressure returned to baseline and stable Postop Assessment: no signs of nausea or vomiting Anesthetic complications: no    Last Vitals:  Filed Vitals:   06/14/16 1127 06/14/16 1158  BP: 143/97 146/96  Pulse: 62 73  Temp:    Resp: 12     Last Pain:  Filed Vitals:   06/14/16 1215  PainSc: 0-No pain                 Didier Brandenburg,JAMES TERRILL

## 2016-06-14 NOTE — Anesthesia Procedure Notes (Signed)
Procedure Name: LMA Insertion Date/Time: 06/14/2016 9:56 AM Performed by: Talbot Grumbling Pre-anesthesia Checklist: Patient identified, Emergency Drugs available, Suction available and Patient being monitored Patient Re-evaluated:Patient Re-evaluated prior to inductionOxygen Delivery Method: Circle system utilized Preoxygenation: Pre-oxygenation with 100% oxygen Intubation Type: IV induction Ventilation: Mask ventilation without difficulty LMA: LMA inserted LMA Size: 5.0 Number of attempts: 2 Placement Confirmation: positive ETCO2 and breath sounds checked- equal and bilateral Tube secured with: Tape Dental Injury: Teeth and Oropharynx as per pre-operative assessment

## 2016-06-14 NOTE — Op Note (Signed)
PATIENT:  Eddie Walker  PRE-OPERATIVE DIAGNOSIS: Migrated right ureteral stent  POST-OPERATIVE DIAGNOSIS: Same  PROCEDURE: Cystoscopy, right ureteroscopic stent extraction  SURGEON:  Lillette Boxer. Blossie Raffel, M.D.  ANESTHESIA:  General  EBL:  Minimal  DRAINS: None  LOCAL MEDICATIONS USED:  None  SPECIMEN:    INDICATION: Eddie Walker is a 70 year old male status post stent placement and lithotripsy of a large right UPJ stone in the recent past. He passed his fragments following lithotripsy, and recent cystoscopy in the office to remove his stent revealed that the stent had migrated proximally. He is here today for stent extraction.  Description of procedure: The patient was properly identified and marked (if applicable) in the holding area. They were then  taken to the operating room and placed on the table in a supine position. General anesthesia was then administered. Once fully anesthetized the patient was moved to the dorsolithotomy position and the genitalia and perineum were sterilely prepped and draped in standard fashion. An official timeout was then performed.   A 23 French panendoscope was advanced through his urethra and prostate into the bladder. Prostate revealed bilobar hypertrophy. Mild bladder trabeculations without urothelial abnormalities. A 0.038 inch guidewire was advanced through his right ureteral orifice into the right renal pelvis, passed his double-J stent which had migrated. A curl was seen within the renal pelvis. At this point, the scope was removed, a 6 [place] rigid short ureteroscope was advanced through his urethra, into the bladder, and up into the right ureter where the stent was removed using a stent loop. One small stone was present in the ureter which was brought into the bladder at the time of stent extraction. Due to minimal instrumentation, I did not leave a stent indwelling. The bladder was drained and the procedure terminated. The patient was  awakened and taken to the PACU in stable condition, having tolerated the procedure well.    PLAN OF CARE: Discharge to home after PACU  PATIENT DISPOSITION:  PACU - hemodynamically stable.

## 2016-06-14 NOTE — Anesthesia Preprocedure Evaluation (Addendum)
Anesthesia Evaluation  Patient identified by MRN, date of birth, ID band Patient awake    Reviewed: Allergy & Precautions, NPO status , Patient's Chart, lab work & pertinent test results  History of Anesthesia Complications Negative for: history of anesthetic complications  Airway Mallampati: I  TM Distance: >3 FB Neck ROM: Full    Dental  (+) Teeth Intact   Pulmonary former smoker,    breath sounds clear to auscultation       Cardiovascular negative cardio ROS   Rhythm:Regular Rate:Normal     Neuro/Psych    GI/Hepatic Neg liver ROS, hiatal hernia, GERD  ,  Endo/Other  negative endocrine ROS  Renal/GU Renal diseaseSignificant renal history.see notes     Musculoskeletal  (+) Arthritis ,   Abdominal   Peds  Hematology negative hematology ROS (+)   Anesthesia Other Findings   Reproductive/Obstetrics                            Anesthesia Physical Anesthesia Plan  ASA: II  Anesthesia Plan: General   Post-op Pain Management:    Induction: Intravenous  Airway Management Planned: LMA  Additional Equipment:   Intra-op Plan:   Post-operative Plan: Extubation in OR  Informed Consent: I have reviewed the patients History and Physical, chart, labs and discussed the procedure including the risks, benefits and alternatives for the proposed anesthesia with the patient or authorized representative who has indicated his/her understanding and acceptance.   Dental advisory given  Plan Discussed with:   Anesthesia Plan Comments:         Anesthesia Quick Evaluation

## 2016-07-14 DIAGNOSIS — N1330 Unspecified hydronephrosis: Secondary | ICD-10-CM | POA: Diagnosis not present

## 2016-07-14 DIAGNOSIS — N201 Calculus of ureter: Secondary | ICD-10-CM | POA: Diagnosis not present

## 2016-09-23 DIAGNOSIS — Z23 Encounter for immunization: Secondary | ICD-10-CM | POA: Diagnosis not present

## 2016-10-07 DIAGNOSIS — R358 Other polyuria: Secondary | ICD-10-CM | POA: Diagnosis not present

## 2016-10-07 DIAGNOSIS — Z125 Encounter for screening for malignant neoplasm of prostate: Secondary | ICD-10-CM | POA: Diagnosis not present

## 2016-10-07 DIAGNOSIS — M109 Gout, unspecified: Secondary | ICD-10-CM | POA: Diagnosis not present

## 2016-10-07 DIAGNOSIS — E784 Other hyperlipidemia: Secondary | ICD-10-CM | POA: Diagnosis not present

## 2016-10-07 DIAGNOSIS — R7309 Other abnormal glucose: Secondary | ICD-10-CM | POA: Diagnosis not present

## 2016-10-14 DIAGNOSIS — N200 Calculus of kidney: Secondary | ICD-10-CM | POA: Diagnosis not present

## 2016-10-14 DIAGNOSIS — J449 Chronic obstructive pulmonary disease, unspecified: Secondary | ICD-10-CM | POA: Diagnosis not present

## 2016-10-14 DIAGNOSIS — N401 Enlarged prostate with lower urinary tract symptoms: Secondary | ICD-10-CM | POA: Diagnosis not present

## 2016-10-14 DIAGNOSIS — R7309 Other abnormal glucose: Secondary | ICD-10-CM | POA: Diagnosis not present

## 2016-10-14 DIAGNOSIS — K219 Gastro-esophageal reflux disease without esophagitis: Secondary | ICD-10-CM | POA: Diagnosis not present

## 2016-10-14 DIAGNOSIS — E784 Other hyperlipidemia: Secondary | ICD-10-CM | POA: Diagnosis not present

## 2016-10-14 DIAGNOSIS — R030 Elevated blood-pressure reading, without diagnosis of hypertension: Secondary | ICD-10-CM | POA: Diagnosis not present

## 2016-10-14 DIAGNOSIS — I700 Atherosclerosis of aorta: Secondary | ICD-10-CM | POA: Diagnosis not present

## 2016-10-14 DIAGNOSIS — Z1389 Encounter for screening for other disorder: Secondary | ICD-10-CM | POA: Diagnosis not present

## 2016-10-14 DIAGNOSIS — M170 Bilateral primary osteoarthritis of knee: Secondary | ICD-10-CM | POA: Diagnosis not present

## 2016-10-14 DIAGNOSIS — E668 Other obesity: Secondary | ICD-10-CM | POA: Diagnosis not present

## 2016-10-14 DIAGNOSIS — Z0000 Encounter for general adult medical examination without abnormal findings: Secondary | ICD-10-CM | POA: Diagnosis not present

## 2016-10-15 DIAGNOSIS — Z1212 Encounter for screening for malignant neoplasm of rectum: Secondary | ICD-10-CM | POA: Diagnosis not present

## 2016-10-21 ENCOUNTER — Encounter: Payer: Self-pay | Admitting: Gastroenterology

## 2016-11-08 DIAGNOSIS — Z6830 Body mass index (BMI) 30.0-30.9, adult: Secondary | ICD-10-CM | POA: Diagnosis not present

## 2016-11-08 DIAGNOSIS — R030 Elevated blood-pressure reading, without diagnosis of hypertension: Secondary | ICD-10-CM | POA: Diagnosis not present

## 2016-11-09 DIAGNOSIS — I10 Essential (primary) hypertension: Secondary | ICD-10-CM | POA: Diagnosis not present

## 2016-12-28 ENCOUNTER — Telehealth: Payer: Self-pay | Admitting: *Deleted

## 2016-12-28 NOTE — Telephone Encounter (Signed)
That is OK with me if it is OK with Dr. Sherlyn Hay

## 2016-12-28 NOTE — Telephone Encounter (Signed)
Dr. Ardis Hughs and Dr. Silverio Decamp,  This pt is scheduled for a screening colonoscopy on 01-18-17.  He saw Dr. Ardis Hughs for an office visit in 2012 but has never had a colonoscopy.  When I called him to explain that we try to keep our patients with the same physician for continuity of care, he states he really cannot change his procedure date. He really wants to keep his scheduled appointment.  He is ok having Dr. Silverio Decamp see him.  Is it ok to transfer his care to Dr. Silverio Decamp?  Thanks, Cyril Mourning

## 2017-01-04 NOTE — Telephone Encounter (Signed)
Its fine with me

## 2017-01-05 ENCOUNTER — Ambulatory Visit (AMBULATORY_SURGERY_CENTER): Payer: Self-pay | Admitting: *Deleted

## 2017-01-05 VITALS — Ht 72.0 in | Wt 210.0 lb

## 2017-01-05 DIAGNOSIS — Z1211 Encounter for screening for malignant neoplasm of colon: Secondary | ICD-10-CM

## 2017-01-05 MED ORDER — NA SULFATE-K SULFATE-MG SULF 17.5-3.13-1.6 GM/177ML PO SOLN: ORAL | 0 refills | Status: DC

## 2017-01-05 NOTE — Progress Notes (Signed)
Patient denies any allergies to eggs or soy. Patient denies any problems with anesthesia/sedation. Patient denies any oxygen use at home and does not take any diet/weight loss medications. EMMI education declined by patient.  

## 2017-01-18 ENCOUNTER — Ambulatory Visit (AMBULATORY_SURGERY_CENTER): Payer: Medicare Other | Admitting: Gastroenterology

## 2017-01-18 ENCOUNTER — Encounter: Payer: Self-pay | Admitting: Gastroenterology

## 2017-01-18 VITALS — BP 117/76 | HR 68 | Temp 98.9°F | Resp 16 | Ht 72.0 in | Wt 210.0 lb

## 2017-01-18 DIAGNOSIS — D120 Benign neoplasm of cecum: Secondary | ICD-10-CM

## 2017-01-18 DIAGNOSIS — Z1212 Encounter for screening for malignant neoplasm of rectum: Secondary | ICD-10-CM

## 2017-01-18 DIAGNOSIS — D125 Benign neoplasm of sigmoid colon: Secondary | ICD-10-CM | POA: Diagnosis not present

## 2017-01-18 DIAGNOSIS — Z1211 Encounter for screening for malignant neoplasm of colon: Secondary | ICD-10-CM | POA: Diagnosis not present

## 2017-01-18 DIAGNOSIS — K219 Gastro-esophageal reflux disease without esophagitis: Secondary | ICD-10-CM | POA: Diagnosis not present

## 2017-01-18 DIAGNOSIS — E669 Obesity, unspecified: Secondary | ICD-10-CM | POA: Diagnosis not present

## 2017-01-18 MED ORDER — SODIUM CHLORIDE 0.9 % IV SOLN: 500.0000 mL | INTRAVENOUS | Status: DC

## 2017-01-18 NOTE — Progress Notes (Signed)
Called to room to assist during endoscopic procedure.  Patient ID and intended procedure confirmed with present staff. Received instructions for my participation in the procedure from the performing physician.  

## 2017-01-18 NOTE — Op Note (Signed)
[facility] Patient Name: Eddie Walker Procedure Date: 01/18/2017 9:34 AM MRN: [id number] Endoscopist: Mauri Pole , MD Age: 71 Referring MD:  Date of Birth: 06/14/1946 Gender: Male Account #: [id number] Procedure:                Colonoscopy Indications:              Screening for colorectal malignant neoplasm, This                            is the patient's first colonoscopy Medicines:                Monitored Anesthesia Care Procedure:                Pre-Anesthesia Assessment:                           - Prior to the procedure, a History and Physical                            was performed, and patient medications and                            allergies were reviewed. The patient's tolerance of                            previous anesthesia was also reviewed. The risks                            and benefits of the procedure and the sedation                            options and risks were discussed with the patient.                            All questions were answered, and informed consent                            was obtained. Prior Anticoagulants: The patient has                            taken no previous anticoagulant or antiplatelet                            agents. ASA Grade Assessment: II - A patient with                            mild systemic disease. After reviewing the risks                            and benefits, the patient was deemed in                            satisfactory condition to undergo the procedure.  After obtaining informed consent, the colonoscope                            was passed under direct vision. Throughout the                            procedure, the patient's blood pressure, pulse, and                            oxygen saturations were monitored continuously. The                            Model CF-HQ190L [id number]) scope was introduced                            through the anus  and advanced to the the cecum,                            identified by appendiceal orifice and ileocecal                            valve. The colonoscopy was performed without                            difficulty. The patient tolerated the procedure                            well. The quality of the bowel preparation was                            excellent. The ileocecal valve, appendiceal                            orifice, and rectum were photographed. Scope In: 9:38:36 AM Scope Out: 9:57:20 AM Scope Withdrawal Time: 0 hours 14 minutes 28 seconds  Total Procedure Duration: 0 hours 18 minutes 44 seconds  Findings:                 The perianal and digital rectal examinations were                            normal.                           A 2 mm polyp was found in the cecum. The polyp was                            sessile. The polyp was removed with a cold biopsy                            forceps. Resection and retrieval were complete.                           Two pedunculated polyps were found in the sigmoid  colon. The polyps were 12 to 16 mm in size. These                            polyps were removed with a hot snare. Resection and                            retrieval were complete.                           Multiple small-mouthed diverticula were found in                            the sigmoid colon.                           Non-bleeding internal hemorrhoids were found during                            retroflexion. The hemorrhoids were medium-sized.                           The exam was otherwise without abnormality. Complications:            No immediate complications. Estimated Blood Loss:     Estimated blood loss: none. Impression:               - One 2 mm polyp in the cecum, removed with a cold                            biopsy forceps. Resected and retrieved.                           - Two 12 to 16 mm polyps in the sigmoid colon,                             removed with a hot snare. Resected and retrieved.                           - Diverticulosis in the sigmoid colon.                           - Non-bleeding internal hemorrhoids.                           - The examination was otherwise normal. Recommendation:           - Patient has a contact number available for                            emergencies. The signs and symptoms of potential                            delayed complications were discussed with the  patient. Return to normal activities tomorrow.                            Written discharge instructions were provided to the                            patient.                           - Resume previous diet.                           - Continue present medications.                           - Await pathology results.                           - Repeat colonoscopy in 3 years for surveillance                            based on pathology results.                           - Return to GI clinic PRN. Mauri Pole, MD 01/18/2017 10:02:17 AM This report has been signed electronically.

## 2017-01-18 NOTE — Patient Instructions (Signed)
YOU HAD AN ENDOSCOPIC PROCEDURE TODAY AT [facility]:   Refer to the procedure report that was given to you for any specific questions about what was found during the examination.  If the procedure report does not answer your questions, please call your gastroenterologist to clarify.  If you requested that your care partner not be given the details of your procedure findings, then the procedure report has been included in a sealed envelope for you to review at your convenience later.  YOU SHOULD EXPECT: Some feelings of bloating in the abdomen. Passage of more gas than usual.  Walking can help get rid of the air that was put into your GI tract during the procedure and reduce the bloating. If you had a lower endoscopy (such as a colonoscopy or flexible sigmoidoscopy) you may notice spotting of blood in your stool or on the toilet paper. If you underwent a bowel prep for your procedure, you may not have a normal bowel movement for a few days.  Please Note:  You might notice some irritation and congestion in your nose or some drainage.  This is from the oxygen used during your procedure.  There is no need for concern and it should clear up in a day or so.  SYMPTOMS TO REPORT IMMEDIATELY:   Following lower endoscopy (colonoscopy or flexible sigmoidoscopy):  Excessive amounts of blood in the stool  Significant tenderness or worsening of abdominal pains  Swelling of the abdomen that is new, acute  Fever of 100F or higher   Following upper endoscopy (EGD)  Vomiting of blood or coffee ground material  New chest pain or pain under the shoulder blades  Painful or persistently difficult swallowing  New shortness of breath  Fever of 100F or higher  Black, tarry-looking stools  For urgent or emergent issues, a gastroenterologist can be reached at any hour by calling [phone].   DIET:  We do recommend a small meal at first, but then you may proceed to your regular diet.  Drink  plenty of fluids but you should avoid alcoholic beverages for 24 hours.  ACTIVITY:  You should plan to take it easy for the rest of today and you should NOT DRIVE or use heavy machinery until tomorrow (because of the sedation medicines used during the test).    FOLLOW UP: Our staff will call the number listed on your records the next business day following your procedure to check on you and address any questions or concerns that you may have regarding the information given to you following your procedure. If we do not reach you, we will leave a message.  However, if you are feeling well and you are not experiencing any problems, there is no need to return our call.  We will assume that you have returned to your regular daily activities without incident.  If any biopsies were taken you will be contacted by phone or by letter within the next 1-3 weeks.  Please call us at [phone] if you have not heard about the biopsies in 3 weeks.    SIGNATURES/CONFIDENTIALITY: You and/or your care partner have signed paperwork which will be entered into your electronic medical record.  These signatures attest to the fact that that the information above on your After Visit Summary has been reviewed and is understood.  Full responsibility of the confidentiality of this discharge information lies with you and/or your care-partner.    Handouts were given to your care partner on polyps,  diverticulosis, and hemorrhoids. You may resume your current medications today. Await biopsy results. Please call if any questions or concerns.   

## 2017-01-18 NOTE — Progress Notes (Signed)
No problems noted in the recovery room. maw 

## 2017-01-18 NOTE — Progress Notes (Signed)
Report to PACU, RN, vss, BBS= Clear.  

## 2017-01-19 ENCOUNTER — Telehealth: Payer: Self-pay

## 2017-01-19 NOTE — Telephone Encounter (Signed)
  Follow up Call-  Call back number 01/18/2017  Post procedure Call Back phone  # [phone]  Permission to leave phone message No  Some recent data might be hidden     Patient questions:  Do you have a fever, pain , or abdominal swelling? No. Pain Score  0 *  Have you tolerated food without any problems? Yes.    Have you been able to return to your normal activities? Yes.    Do you have any questions about your discharge instructions: Diet   No. Medications  No. Follow up visit  No.  Do you have questions or concerns about your Care? No.  Actions: * If pain score is 4 or above: No action needed, pain <4.

## 2017-01-21 ENCOUNTER — Encounter: Payer: Self-pay | Admitting: Gastroenterology

## 2017-03-23 DIAGNOSIS — N200 Calculus of kidney: Secondary | ICD-10-CM | POA: Diagnosis not present

## 2017-03-23 DIAGNOSIS — N529 Male erectile dysfunction, unspecified: Secondary | ICD-10-CM | POA: Diagnosis not present

## 2017-03-23 DIAGNOSIS — N400 Enlarged prostate without lower urinary tract symptoms: Secondary | ICD-10-CM | POA: Diagnosis not present

## 2017-08-31 ENCOUNTER — Emergency Department (HOSPITAL_BASED_OUTPATIENT_CLINIC_OR_DEPARTMENT_OTHER): Payer: Medicare Other

## 2017-08-31 ENCOUNTER — Emergency Department (HOSPITAL_BASED_OUTPATIENT_CLINIC_OR_DEPARTMENT_OTHER)
Admission: EM | Admit: 2017-08-31 | Discharge: 2017-09-01 | Disposition: A | Discharge status: Home / Self Care | Payer: Medicare Other | Attending: Emergency Medicine | Admitting: Emergency Medicine

## 2017-08-31 ENCOUNTER — Encounter (HOSPITAL_BASED_OUTPATIENT_CLINIC_OR_DEPARTMENT_OTHER): Payer: Self-pay | Admitting: Emergency Medicine

## 2017-08-31 DIAGNOSIS — R509 Fever, unspecified: Secondary | ICD-10-CM | POA: Diagnosis not present

## 2017-08-31 DIAGNOSIS — Z91010 Allergy to peanuts: Secondary | ICD-10-CM | POA: Diagnosis not present

## 2017-08-31 DIAGNOSIS — N201 Calculus of ureter: Secondary | ICD-10-CM | POA: Diagnosis not present

## 2017-08-31 DIAGNOSIS — Z7982 Long term (current) use of aspirin: Secondary | ICD-10-CM | POA: Diagnosis not present

## 2017-08-31 DIAGNOSIS — Z79899 Other long term (current) drug therapy: Secondary | ICD-10-CM | POA: Diagnosis not present

## 2017-08-31 DIAGNOSIS — R319 Hematuria, unspecified: Secondary | ICD-10-CM | POA: Diagnosis not present

## 2017-08-31 DIAGNOSIS — Z87891 Personal history of nicotine dependence: Secondary | ICD-10-CM | POA: Insufficient documentation

## 2017-08-31 DIAGNOSIS — N390 Urinary tract infection, site not specified: Secondary | ICD-10-CM | POA: Insufficient documentation

## 2017-08-31 DIAGNOSIS — N200 Calculus of kidney: Secondary | ICD-10-CM | POA: Diagnosis not present

## 2017-08-31 DIAGNOSIS — R1031 Right lower quadrant pain: Secondary | ICD-10-CM | POA: Diagnosis present

## 2017-08-31 LAB — COMPREHENSIVE METABOLIC PANEL
ALBUMIN: 3.5 g/dL (ref 3.5–5.0)
ALK PHOS: 75 U/L (ref 38–126)
ALT: 22 U/L (ref 17–63)
ANION GAP: 9 (ref 5–15)
AST: 22 U/L (ref 15–41)
BUN: 24 mg/dL — ABNORMAL HIGH (ref 6–20)
CALCIUM: 8.8 mg/dL — AB (ref 8.9–10.3)
CO2: 23 mmol/L (ref 22–32)
CREATININE: 1.16 mg/dL (ref 0.61–1.24)
Chloride: 105 mmol/L (ref 101–111)
GFR calc Af Amer: >60 mL/min (ref >60)
GFR calc non Af Amer: >60 mL/min (ref >60)
GLUCOSE: 173 mg/dL — AB (ref 65–99)
Potassium: 3.7 mmol/L (ref 3.5–5.1)
Sodium: 137 mmol/L (ref 135–145)
TOTAL PROTEIN: 7.8 g/dL (ref 6.5–8.1)
Total Bilirubin: 0.5 mg/dL (ref 0.3–1.2)

## 2017-08-31 LAB — URINALYSIS, ROUTINE W REFLEX MICROSCOPIC
Bilirubin Urine: NEGATIVE
GLUCOSE, UA: NEGATIVE mg/dL
KETONES UR: 15 mg/dL — AB
Nitrite: NEGATIVE
PH: 6.5 (ref 5.0–8.0)
Protein, ur: 30 mg/dL — AB
Specific Gravity, Urine: 1.01 (ref 1.005–1.030)

## 2017-08-31 LAB — CBC WITH DIFFERENTIAL/PLATELET
BASOS ABS: 0 10*3/uL (ref 0.0–0.1)
BASOS PCT: 0 %
EOS ABS: 0 10*3/uL (ref 0.0–0.7)
EOS PCT: 0 %
HEMATOCRIT: 41.2 % (ref 39.0–52.0)
Hemoglobin: 13.4 g/dL (ref 13.0–17.0)
Lymphocytes Relative: 7 %
Lymphs Abs: 0.8 10*3/uL (ref 0.7–4.0)
MCH: 30.4 pg (ref 26.0–34.0)
MCHC: 32.5 g/dL (ref 30.0–36.0)
MCV: 93.4 fL (ref 78.0–100.0)
MONO ABS: 1 10*3/uL (ref 0.1–1.0)
Monocytes Relative: 8 %
NEUTROS ABS: 10.7 10*3/uL — AB (ref 1.7–7.7)
Neutrophils Relative %: 85 %
PLATELETS: 195 10*3/uL (ref 150–400)
RBC: 4.41 MIL/uL (ref 4.22–5.81)
RDW: 14.1 % (ref 11.5–15.5)
WBC: 12.5 10*3/uL — ABNORMAL HIGH (ref 4.0–10.5)

## 2017-08-31 LAB — URINALYSIS, MICROSCOPIC (REFLEX)

## 2017-08-31 LAB — I-STAT CG4 LACTIC ACID, ED: LACTIC ACID, VENOUS: 0.95 mmol/L (ref 0.5–1.9)

## 2017-08-31 MED ORDER — SODIUM CHLORIDE 0.9 % IV BOLUS (SEPSIS): 1000.0000 mL | Freq: Once | INTRAVENOUS | Status: DC

## 2017-08-31 MED ORDER — DEXTROSE 5 % IV SOLN
1.0000 g | Freq: Once | INTRAVENOUS | Status: AC
  Administered 2017-08-31: 1 g via INTRAVENOUS
  Filled 2017-08-31: qty 10

## 2017-08-31 MED ORDER — SODIUM CHLORIDE 0.9 % IV BOLUS (SEPSIS)
2000.0000 mL | Freq: Once | INTRAVENOUS | Status: AC
  Administered 2017-08-31: 2000 mL via INTRAVENOUS

## 2017-08-31 NOTE — ED Provider Notes (Signed)
[facility] Provider Note   CSN: [id number] Arrival date & time: 08/31/17  1926     History   Chief Complaint Chief Complaint  Patient presents with  . Flank Pain    HPI Eddie Walker is a 71 y.o. male.  71 year old male with past mental history including kidney stones, GERD who presents with right flank pain. Yesterday he began having intermittent right flank pain that is worse with certain movements and has been associated with nausea. He began having fevers last night that have persisted today. He has been taking Tylenol regularly for his symptoms. Currently his pain is mild. He has been eating and drinking normally. He has noticed some dark urine but he denies any dysuria or increased urinary frequency. He denies any vomiting or abdominal pain. This feels exactly like previous episodes of kidney stone. He has required lithotripsy twice in the past. He recently recovered from a viral URI but states he is not currently having any of those symptoms.   The history is provided by the patient.    Past Medical History:  Diagnosis Date  . Arthritis   . GERD (gastroesophageal reflux disease)   . H/O hiatal hernia   . Hernia 01/2013  . Kidney stone 2008    Patient Active Problem List   Diagnosis Date Noted  . Acute respiratory failure ([place]) 04/13/2016  . AKI (acute kidney injury) ([place]) 04/06/2016  . Kidney stone 04/06/2016  . Elevated blood pressure 04/06/2016  . Ureteral stone with hydronephrosis   . Sepsis ([place])   . Urinary tract infectious disease   . Pyelonephritis 04/05/2016  . Hydronephrosis of right kidney 04/05/2016  . Renal stone 04/17/2014  . Kidney stone on left side 04/15/2014  . Acute renal failure ([place]) 03/28/2013  . Sepsis syndrome 03/28/2013  . UTI (urinary tract infection) 03/28/2013  . BPH (benign prostatic hyperplasia) 03/28/2013  . HEARTBURN 03/08/2011    Past Surgical History:  Procedure Laterality Date  . CYSTOSCOPY WITH RETROGRADE  PYELOGRAM, URETEROSCOPY AND STENT PLACEMENT Right 04/05/2016   Procedure: CYSTOSCOPY WITH RETROGRADE PYELOGRAM, URETEROSCOPY AND STENT PLACEMENT;  Surgeon: Franchot Gallo, MD;  Location: [facility];  Service: Urology;  Laterality: Right;  . CYSTOSCOPY WITH STENT PLACEMENT Left 04/16/2014   Procedure: CYSTOSCOPY WITH STENT PLACEMENT;  Surgeon: Irine Seal, MD;  Location: [facility];  Service: Urology;  Laterality: Left;  . CYSTOSCOPY WITH URETEROSCOPY AND STENT PLACEMENT Right 06/14/2016   Procedure: CYSTOSCOPY WITH RIGHT URETEROSCOPIC STENT EXTRACTION;  Surgeon: Franchot Gallo, MD;  Location: [facility];  Service: Urology;  Laterality: Right;  . EXTRACORPOREAL SHOCK WAVE LITHOTRIPSY Right 2014  . HERNIA REPAIR     left  . KNEE ARTHROSCOPY Left 2008  . ORCHIECTOMY Right 1971  . teeth pulled  2010       Home Medications    Prior to Admission medications   Medication Sig Start Date End Date Taking? Authorizing Provider  aspirin EC 81 MG tablet Take 81 mg by mouth daily.    [provider]  dutasteride (AVODART) 0.5 MG capsule Take 0.5 mg by mouth daily.    [provider]  EPINEPHrine (EPIPEN) 0.3 mg/0.3 mL SOAJ injection Inject 0.3 mg into the muscle once as needed (For anaphylaxis.).     [provider]  finasteride (PROSCAR) 5 MG tablet Take 5 mg by mouth daily. 04/10/16   [provider]  glucosamine-chondroitin 500-400 MG tablet Take 1 tablet by mouth daily.     [provider]  Multiple Vitamin (  MULTIVITAMIN WITH MINERALS) TABS Take 1 tablet by mouth daily.    [provider]  omeprazole (PRILOSEC) 40 MG capsule Take 40 mg by mouth daily. 04/17/16   [provider]  VIAGRA 100 MG tablet Take 100 mg by mouth daily as needed for erectile dysfunction.  04/15/16   [provider]    Family History Family History  Problem Relation Age of Onset  . Diverticulitis Mother   . Colon cancer Neg Hx     Social History Social  History  Substance Use Topics  . Smoking status: Former Smoker    Packs/day: 1.50    Types: Cigarettes, Pipe, Cigars    Quit date: 12/05/2009  . Smokeless tobacco: Never Used  . Alcohol use 1.2 oz/week    1 Glasses of wine, 1 Shots of liquor per week     Allergies   Peanut-containing drug products and Novocain [procaine hcl]   Review of Systems Review of Systems All other systems reviewed and are negative except that which was mentioned in HPI   Physical Exam Updated Vital Signs BP 98/69 (BP Location: Left Arm)   Pulse 90   Temp 100.3 F (37.9 C) (Oral)   Resp 18   Ht 6\' 1"  (1.854 m)   Wt 95.3 kg (210 lb)   SpO2 94%   BMI 27.71 kg/m   Physical Exam  Constitutional: He is oriented to person, place, and time. He appears well-developed and well-nourished. No distress.  Pleasant, comfortable  HENT:  Head: Normocephalic and atraumatic.  Moist mucous membranes  Eyes: Pupils are equal, round, and reactive to light. Conjunctivae are normal.  Neck: Neck supple.  Cardiovascular: Normal rate, regular rhythm and normal heart sounds.   No murmur heard. Pulmonary/Chest: Effort normal and breath sounds normal.  Abdominal: Soft. Bowel sounds are normal. He exhibits no distension. There is no tenderness.  Musculoskeletal: He exhibits no edema.  Neurological: He is alert and oriented to person, place, and time.  Fluent speech  Skin: Skin is warm and dry.  Psychiatric: He has a normal mood and affect. Judgment normal.  Nursing note and vitals reviewed.    ED Treatments / Results  Labs (all labs ordered are listed, but only abnormal results are displayed) Labs Reviewed  URINALYSIS, ROUTINE W REFLEX MICROSCOPIC - Abnormal; Notable for the following:       Result Value   APPearance CLOUDY (*)    Hgb urine dipstick MODERATE (*)    Ketones, ur 15 (*)    Protein, ur 30 (*)    Leukocytes, UA MODERATE (*)    All other components within normal limits  URINALYSIS, MICROSCOPIC  (REFLEX) - Abnormal; Notable for the following:    Bacteria, UA FEW (*)    Squamous Epithelial / LPF PRESENT (*)    All other components within normal limits  COMPREHENSIVE METABOLIC PANEL - Abnormal; Notable for the following:    Glucose, Bld 173 (*)    BUN 24 (*)    Calcium 8.8 (*)    All other components within normal limits  CBC WITH DIFFERENTIAL/PLATELET - Abnormal; Notable for the following:    WBC 12.5 (*)    Neutro Abs 10.7 (*)    All other components within normal limits  URINE CULTURE  CULTURE, BLOOD (ROUTINE X 2)  CULTURE, BLOOD (ROUTINE X 2)  I-STAT CG4 LACTIC ACID, ED    EKG  EKG Interpretation None       Radiology Ct Renal Stone Study  Result Date: 08/31/2017  CLINICAL DATA:  Right flank pain for 2 days. Hematuria and nausea. History of previous renal stone and lithotripsy. EXAM: CT ABDOMEN AND PELVIS WITHOUT CONTRAST TECHNIQUE: Multidetector CT imaging of the abdomen and pelvis was performed following the standard protocol without IV contrast. COMPARISON:  04/05/2016 FINDINGS: Lower chest: Mild atelectasis in the lung bases. Hepatobiliary: Circumscribed low-attenuation lesion in the dome of the liver measuring 2.2 cm diameter, likely representing a cyst. No change since prior study. No other focal lesions identified. Gallbladder and bile ducts are unremarkable. Pancreas: Unremarkable. No pancreatic ductal dilatation or surrounding inflammatory changes. Spleen: Normal in size without focal abnormality. Adrenals/Urinary Tract: Bilateral subcentimeter adrenal gland nodules. Fat density is consistent with benign adenoma. No change since prior study. 7 mm stone in the proximal left ureter at or just below the ureteropelvic junction. Moderate proximal hydronephrosis. Multiple smaller stones are demonstrated within both kidneys. No evidence of obstruction on the right. Bilateral low-attenuation lesions in the kidneys, largest on the right measuring 2 cm diameter. These likely  represent cysts. subcentimeter hemorrhagic cyst in the upper pole left kidney. Bladder wall is not thickened. No bladder filling defects. Stomach/Bowel: Stomach is within normal limits. Appendix appears normal. No evidence of bowel wall thickening, distention, or inflammatory changes. Vascular/Lymphatic: Aortic atherosclerosis. No enlarged abdominal or pelvic lymph nodes. Reproductive: Prostate is unremarkable. Other: Small left inguinal hernia containing fat. No free air or free fluid in the abdomen. Moderate periumbilical hernia containing fat. Musculoskeletal: Degenerative changes in the spine. No destructive bone lesions. IMPRESSION: 1. 7 mm stone in the proximal left ureter with moderate proximal obstruction. 2. Multiple bilateral nonobstructing intrarenal stones. 3. Small bilateral renal cysts.  Hepatic cyst. 4. Bilateral adrenal gland nodules with benign appearance. No change since prior study. 5. Left inguinal and periumbilical hernias containing fat. 6. Aortic atherosclerosis. Electronically Signed   By: Lucienne Capers M.D.   On: 08/31/2017 23:07    Procedures Procedures (including critical care time)  Medications Ordered in ED Medications  sodium chloride 0.9 % bolus 2,000 mL (not administered)  cefTRIAXone (ROCEPHIN) 1 g in dextrose 5 % 50 mL IVPB (1 g Intravenous New Bag/Given 08/31/17 2251)     Initial Impression / Assessment and Plan / ED Course  I have reviewed the triage vital signs and the nursing notes.  Pertinent labs & imaging results that were available during my care of the patient were reviewed by me and considered in my medical decision making (see chart for details).     PT w/ h/o kidney stones p/w 1 day of R flank pain, dark urine, and fevers. He was comfortable on exam, NAD. VS notable for T100.3, HR 112, BP 125/96. No abdominal tenderness. His urine shows blood and WBCs, concerning for infection. Lactate normal, WBC 12.5, creatinine 1.16. Blood and urine cultures sent  because of fever. Gave dose of ceftriaxone and IV fluids.   I am concerned about the possibility of an infected stone given his unilateral symptoms and abnormal UA. Obtained CT renal study. CT shows 48mm Proximal right obstructing ureteral stone. Because of his fever, leukocytosis, UA, and evidence of stone, I contacted urology and discussed with Dr. Matilde Sprang. He has recommended transfer to [facility] ER for evaluation. Discussed w/ EDP, Dr. Tyrone Nine, who is in agreement. Pt will be transferred for further management of infected kidney stone.   Final Clinical Impressions(s) / ED Diagnoses   Final diagnoses:  Urinary tract infection with hematuria, site unspecified  Right ureteral stone    New Prescriptions  New Prescriptions   No medications on file     Casy Brunetto, Wenda Overland, MD 08/31/17 2334

## 2017-08-31 NOTE — ED Notes (Signed)
Warm blanket given

## 2017-08-31 NOTE — ED Triage Notes (Signed)
Patient states that he is having fevers and right sided flank pain x 2 days. The patient reports that he is nauseated - patient reports that "I have been sucking down the tylenol

## 2017-08-31 NOTE — ED Notes (Signed)
ED Provider at bedside. 

## 2017-09-01 DIAGNOSIS — R1031 Right lower quadrant pain: Secondary | ICD-10-CM | POA: Diagnosis not present

## 2017-09-01 DIAGNOSIS — N201 Calculus of ureter: Secondary | ICD-10-CM | POA: Diagnosis not present

## 2017-09-01 DIAGNOSIS — R509 Fever, unspecified: Secondary | ICD-10-CM | POA: Diagnosis not present

## 2017-09-01 NOTE — Discharge Instructions (Signed)
We saw you in the ER for the abdominal pain. °Our results indicate that you have a kidney stone. °We were able to get your pain is relative control, and we can safely send you home. ° °Take the meds prescribed. °Set up an appointment with the Urologist. °If the pain is unbearable, you start having fevers, chills, and are unable to keep any meds down - then return to the ER. °  °

## 2017-09-01 NOTE — ED Provider Notes (Signed)
Urology clear patient. They have prescribed appropriate meds, including levquin. Strict ER return precautions have been discussed, and patient is agreeing with the plan and is comfortable with the workup done and the recommendations from the ER.    Varney Biles, MD 09/01/17 [phone]

## 2017-09-01 NOTE — ED Notes (Signed)
Patient given a sprite, awaiting urology to see patient

## 2017-09-01 NOTE — Consult Note (Signed)
Urology Consult  Referring physician: Dollene Cleveland Reason for referral: Ureter stone  Chief Complaint: Ureter stone  History of Present Illness: Elderly male with past history of stones; right  flank pain  Off and on possibly after golf since Tuesday; low grade temp x 24 hours; 100.3 in ER; no left pain; no other stone of LUTS symptoms; no blood; past history of sepsis (reviewed chart in detail)  Modifying factors: There are no other modifying factors  Associated signs and symptoms: There are no other associated signs and symptoms Aggravating and relieving factors: There are no other aggravating or relieving factors Severity: Moderate Duration: Persistent  CTscan: 7 mm proximal left ureter stone; UA few bacteria;   Vitals and temp normal since one reading; agree with ER staff that he looks "great x2: repeated vitals one last time and again all normal  Past Medical History:  Diagnosis Date  . Arthritis   . GERD (gastroesophageal reflux disease)   . H/O hiatal hernia   . Hernia 01/2013  . Kidney stone 2008   Past Surgical History:  Procedure Laterality Date  . CYSTOSCOPY WITH RETROGRADE PYELOGRAM, URETEROSCOPY AND STENT PLACEMENT Right 04/05/2016   Procedure: CYSTOSCOPY WITH RETROGRADE PYELOGRAM, URETEROSCOPY AND STENT PLACEMENT;  Surgeon: Franchot Gallo, MD;  Location: [facility];  Service: Urology;  Laterality: Right;  . CYSTOSCOPY WITH STENT PLACEMENT Left 04/16/2014   Procedure: CYSTOSCOPY WITH STENT PLACEMENT;  Surgeon: Irine Seal, MD;  Location: [facility];  Service: Urology;  Laterality: Left;  . CYSTOSCOPY WITH URETEROSCOPY AND STENT PLACEMENT Right 06/14/2016   Procedure: CYSTOSCOPY WITH RIGHT URETEROSCOPIC STENT EXTRACTION;  Surgeon: Franchot Gallo, MD;  Location: [facility];  Service: Urology;  Laterality: Right;  . EXTRACORPOREAL SHOCK WAVE LITHOTRIPSY Right 2014  . HERNIA REPAIR     left  . KNEE ARTHROSCOPY Left 2008  . ORCHIECTOMY Right 1971  . teeth pulled  2010     Medications: I have reviewed the patient's current medications. Allergies:  Allergies  Allergen Reactions  . Peanut-Containing Drug Products Anaphylaxis and Other (See Comments)    Patient has Epipen to treat this- anaphylaxis shock  . Novocain [Procaine Hcl] Itching, Rash and Other (See Comments)    Low grade fever    Family History  Problem Relation Age of Onset  . Diverticulitis Mother   . Colon cancer Neg Hx    Social History:  reports that he quit smoking about 7 years ago. His smoking use included Cigarettes, Pipe, and Cigars. He smoked 1.50 packs per day. He has never used smokeless tobacco. He reports that he drinks about 1.2 oz of alcohol per week . He reports that he does not use drugs.  ROS: All systems are reviewed and negative except as noted. Rest negative  Physical Exam:  Vital signs in last 24 hours: Temp:  [98.1 F (36.7 C)-100.3 F (37.9 C)] 98.1 F (36.7 C) (09/06 0052) Pulse Rate:  [77-112] 82 (09/06 0415) Resp:  [16-18] 16 (09/06 0400) BP: (98-144)/(69-98) 144/91 (09/06 0415) SpO2:  [94 %-100 %] 99 % (09/06 0415) Weight:  [95.3 kg (210 lb)] 95.3 kg (210 lb) (09/05 1941)  Cardiovascular: Skin warm; not flushed Respiratory: Breaths quiet; no shortness of breath Abdomen: No masses Neurological: Normal sensation to touch Musculoskeletal: Normal motor function arms and legs Lymphatics: No inguinal adenopathy Skin: No rashes Genitourinary:no CVA tenderness; non toxic; smiling and pain free drinking fluids  Laboratory Data:  Results for orders placed or performed during the hospital encounter of 08/31/17 (from the past  72 hour(s))  Urinalysis, Routine w reflex microscopic     Status: Abnormal   Collection Time: 08/31/17  7:47 PM  Result Value Ref Range   Color, Urine YELLOW YELLOW   APPearance CLOUDY (A) CLEAR   Specific Gravity, Urine 1.010 1.005 - 1.030   pH 6.5 5.0 - 8.0   Glucose, UA NEGATIVE NEGATIVE mg/dL   Hgb urine dipstick MODERATE (A)  NEGATIVE   Bilirubin Urine NEGATIVE NEGATIVE   Ketones, ur 15 (A) NEGATIVE mg/dL   Protein, ur 30 (A) NEGATIVE mg/dL   Nitrite NEGATIVE NEGATIVE   Leukocytes, UA MODERATE (A) NEGATIVE  Urinalysis, Microscopic (reflex)     Status: Abnormal   Collection Time: 08/31/17  7:47 PM  Result Value Ref Range   RBC / HPF 6-30 0 - 5 RBC/hpf   WBC, UA 6-30 0 - 5 WBC/hpf   Bacteria, UA FEW (A) NONE SEEN   Squamous Epithelial / LPF PRESENT (A) NONE SEEN  Comprehensive metabolic panel     Status: Abnormal   Collection Time: 08/31/17 10:05 PM  Result Value Ref Range   Sodium 137 135 - 145 mmol/L   Potassium 3.7 3.5 - 5.1 mmol/L   Chloride 105 101 - 111 mmol/L   CO2 23 22 - 32 mmol/L   Glucose, Bld 173 (H) 65 - 99 mg/dL   BUN 24 (H) 6 - 20 mg/dL   Creatinine, Ser 1.16 0.61 - 1.24 mg/dL   Calcium 8.8 (L) 8.9 - 10.3 mg/dL   Total Protein 7.8 6.5 - 8.1 g/dL   Albumin 3.5 3.5 - 5.0 g/dL   AST 22 15 - 41 U/L   ALT 22 17 - 63 U/L   Alkaline Phosphatase 75 38 - 126 U/L   Total Bilirubin 0.5 0.3 - 1.2 mg/dL   GFR calc non Af Amer >60 >60 mL/min   GFR calc Af Amer >60 >60 mL/min    Comment: (NOTE) The eGFR has been calculated using the CKD EPI equation. This calculation has not been validated in all clinical situations. eGFR's persistently <60 mL/min signify possible Chronic Kidney Disease.    Anion gap 9 5 - 15  CBC with Differential     Status: Abnormal   Collection Time: 08/31/17 10:05 PM  Result Value Ref Range   WBC 12.5 (H) 4.0 - 10.5 K/uL   RBC 4.41 4.22 - 5.81 MIL/uL   Hemoglobin 13.4 13.0 - 17.0 g/dL   HCT 41.2 39.0 - 52.0 %   MCV 93.4 78.0 - 100.0 fL   MCH 30.4 26.0 - 34.0 pg   MCHC 32.5 30.0 - 36.0 g/dL   RDW 14.1 11.5 - 15.5 %   Platelets 195 150 - 400 K/uL   Neutrophils Relative % 85 %   Neutro Abs 10.7 (H) 1.7 - 7.7 K/uL   Lymphocytes Relative 7 %   Lymphs Abs 0.8 0.7 - 4.0 K/uL   Monocytes Relative 8 %   Monocytes Absolute 1.0 0.1 - 1.0 K/uL   Eosinophils Relative 0 %    Eosinophils Absolute 0.0 0.0 - 0.7 K/uL   Basophils Relative 0 %   Basophils Absolute 0.0 0.0 - 0.1 K/uL  I-Stat CG4 Lactic Acid, ED     Status: None   Collection Time: 08/31/17 10:38 PM  Result Value Ref Range   Lactic Acid, Venous 0.95 0.5 - 1.9 mmol/L   No results found for this or any previous visit (from the past 240 hour(s)). Creatinine:  Recent Labs  08/31/17 2205  CREATININE 1.16    Xrays: See report/chart As above  Impression/Assessment:  Asymptomatic left ureter stone; at risk for UTI and sepsis; picture drawn; FULL explanation of stent and reasons why and possible sequelae with and without; recommended stent  After a thorough review of the management options for the patient's condition the patient  Elected NOT to proceed with surgical therapy as noted above. We have discussed the potential benefits and risks of the procedure, side effects of the proposed treatment, the likelihood of the patient achieving the goals of the procedure, and any potential problems that might occur during the procedure or recuperation. Informed consent has been obtained.  Plan:  Very educated and compliant patient who elects not to have stent today but to me followed daily by phone on levaquin (based on past c/s); will call immediately if clinical course changes; understands stent decision clearly; exchanged numbers and I will call in am and arrange f/up;   levaquin and flomax and phenergan; and norco given  MACDIARMID,SCOTT A 09/01/2017, 6:49 AM

## 2017-09-03 LAB — URINE CULTURE

## 2017-09-04 ENCOUNTER — Telehealth: Payer: Self-pay | Admitting: *Deleted

## 2017-09-04 NOTE — Telephone Encounter (Signed)
Post ED Visit - Positive Culture Follow-up  Culture report reviewed by antimicrobial stewardship pharmacist:  []  Elenor Quinones, Pharm.D. []  Heide Guile, Pharm.D., BCPS AQ-ID []  Parks Neptune, Pharm.D., BCPS []  Alycia Rossetti, Pharm.D., BCPS []  Friedens, Pharm.D., BCPS, AAHIVP []  Legrand Como, Pharm.D., BCPS, AAHIVP [x]  Salome Arnt, PharmD, BCPS []  Dimitri Ped, PharmD, BCPS []  Vincenza Hews, PharmD, BCPS  Positive urine culture Treated with Levaquin by urology, organism sensitive to the same and no further patient follow-up is required at this time.  Eddie Walker 09/04/2017, 11:18 AM

## 2017-09-06 LAB — CULTURE, BLOOD (ROUTINE X 2)
CULTURE: NO GROWTH
Culture: NO GROWTH
SPECIAL REQUESTS: ADEQUATE
SPECIAL REQUESTS: ADEQUATE

## 2017-09-07 DIAGNOSIS — N201 Calculus of ureter: Secondary | ICD-10-CM | POA: Diagnosis not present

## 2017-09-08 DIAGNOSIS — M1711 Unilateral primary osteoarthritis, right knee: Secondary | ICD-10-CM | POA: Diagnosis not present

## 2017-09-21 DIAGNOSIS — N202 Calculus of kidney with calculus of ureter: Secondary | ICD-10-CM | POA: Diagnosis not present

## 2017-10-20 DIAGNOSIS — N202 Calculus of kidney with calculus of ureter: Secondary | ICD-10-CM | POA: Diagnosis not present

## 2018-01-24 DIAGNOSIS — I10 Essential (primary) hypertension: Secondary | ICD-10-CM | POA: Diagnosis not present

## 2018-01-24 DIAGNOSIS — Z125 Encounter for screening for malignant neoplasm of prostate: Secondary | ICD-10-CM | POA: Diagnosis not present

## 2018-01-24 DIAGNOSIS — E7849 Other hyperlipidemia: Secondary | ICD-10-CM | POA: Diagnosis not present

## 2018-01-24 DIAGNOSIS — R82998 Other abnormal findings in urine: Secondary | ICD-10-CM | POA: Diagnosis not present

## 2018-01-24 DIAGNOSIS — M109 Gout, unspecified: Secondary | ICD-10-CM | POA: Diagnosis not present

## 2018-01-31 DIAGNOSIS — I700 Atherosclerosis of aorta: Secondary | ICD-10-CM | POA: Diagnosis not present

## 2018-01-31 DIAGNOSIS — E7849 Other hyperlipidemia: Secondary | ICD-10-CM | POA: Diagnosis not present

## 2018-01-31 DIAGNOSIS — I10 Essential (primary) hypertension: Secondary | ICD-10-CM | POA: Diagnosis not present

## 2018-01-31 DIAGNOSIS — R7309 Other abnormal glucose: Secondary | ICD-10-CM | POA: Diagnosis not present

## 2018-01-31 DIAGNOSIS — J449 Chronic obstructive pulmonary disease, unspecified: Secondary | ICD-10-CM | POA: Diagnosis not present

## 2018-01-31 DIAGNOSIS — Z1389 Encounter for screening for other disorder: Secondary | ICD-10-CM | POA: Diagnosis not present

## 2018-01-31 DIAGNOSIS — Z6829 Body mass index (BMI) 29.0-29.9, adult: Secondary | ICD-10-CM | POA: Diagnosis not present

## 2018-01-31 DIAGNOSIS — N401 Enlarged prostate with lower urinary tract symptoms: Secondary | ICD-10-CM | POA: Diagnosis not present

## 2018-01-31 DIAGNOSIS — Z0000 Encounter for general adult medical examination without abnormal findings: Secondary | ICD-10-CM | POA: Diagnosis not present

## 2018-01-31 DIAGNOSIS — E668 Other obesity: Secondary | ICD-10-CM | POA: Diagnosis not present

## 2018-01-31 DIAGNOSIS — M1712 Unilateral primary osteoarthritis, left knee: Secondary | ICD-10-CM | POA: Diagnosis not present

## 2018-01-31 DIAGNOSIS — M1711 Unilateral primary osteoarthritis, right knee: Secondary | ICD-10-CM | POA: Diagnosis not present

## 2018-01-31 MED FILL — SILDENAFIL CITRATE 100 MG T: 100 | 5 days supply | Qty: 5 | Fill #0

## 2018-01-31 MED FILL — LISINOPRIL 10 MG TABLET: 10 | 90 days supply | Qty: 90 | Fill #0 | Status: TO

## 2018-02-01 DIAGNOSIS — Z1212 Encounter for screening for malignant neoplasm of rectum: Secondary | ICD-10-CM | POA: Diagnosis not present

## 2018-03-02 DIAGNOSIS — E668 Other obesity: Secondary | ICD-10-CM | POA: Diagnosis not present

## 2018-03-02 DIAGNOSIS — Z6828 Body mass index (BMI) 28.0-28.9, adult: Secondary | ICD-10-CM | POA: Diagnosis not present

## 2018-03-02 DIAGNOSIS — I10 Essential (primary) hypertension: Secondary | ICD-10-CM | POA: Diagnosis not present

## 2018-03-02 DIAGNOSIS — J329 Chronic sinusitis, unspecified: Secondary | ICD-10-CM | POA: Diagnosis not present

## 2018-03-04 DIAGNOSIS — N390 Urinary tract infection, site not specified: Secondary | ICD-10-CM | POA: Diagnosis not present

## 2018-03-04 DIAGNOSIS — Z87442 Personal history of urinary calculi: Secondary | ICD-10-CM | POA: Diagnosis not present

## 2018-03-12 DIAGNOSIS — R110 Nausea: Secondary | ICD-10-CM | POA: Diagnosis not present

## 2018-03-12 DIAGNOSIS — N210 Calculus in bladder: Secondary | ICD-10-CM | POA: Diagnosis not present

## 2018-03-12 DIAGNOSIS — M549 Dorsalgia, unspecified: Secondary | ICD-10-CM | POA: Diagnosis not present

## 2018-03-12 DIAGNOSIS — N132 Hydronephrosis with renal and ureteral calculous obstruction: Secondary | ICD-10-CM | POA: Diagnosis not present

## 2018-03-12 DIAGNOSIS — R109 Unspecified abdominal pain: Secondary | ICD-10-CM | POA: Diagnosis not present

## 2018-03-12 DIAGNOSIS — Z87891 Personal history of nicotine dependence: Secondary | ICD-10-CM | POA: Diagnosis not present

## 2018-03-12 DIAGNOSIS — N200 Calculus of kidney: Secondary | ICD-10-CM | POA: Diagnosis not present

## 2018-03-12 DIAGNOSIS — Z9889 Other specified postprocedural states: Secondary | ICD-10-CM | POA: Diagnosis not present

## 2018-03-12 DIAGNOSIS — R630 Anorexia: Secondary | ICD-10-CM | POA: Diagnosis not present

## 2018-03-14 ENCOUNTER — Other Ambulatory Visit: Payer: Self-pay

## 2018-03-14 ENCOUNTER — Encounter (HOSPITAL_BASED_OUTPATIENT_CLINIC_OR_DEPARTMENT_OTHER): Payer: Self-pay | Admitting: *Deleted

## 2018-03-14 ENCOUNTER — Inpatient Hospital Stay (HOSPITAL_COMMUNITY): Payer: Medicare Other | Admitting: Anesthesiology

## 2018-03-14 ENCOUNTER — Inpatient Hospital Stay (HOSPITAL_BASED_OUTPATIENT_CLINIC_OR_DEPARTMENT_OTHER)
Admission: EM | Admit: 2018-03-14 | Discharge: 2018-03-16 | DRG: 854 | Disposition: A | Discharge status: Home / Self Care | Payer: Medicare Other | Attending: Internal Medicine | Admitting: Internal Medicine

## 2018-03-14 ENCOUNTER — Inpatient Hospital Stay (HOSPITAL_COMMUNITY): Payer: Medicare Other

## 2018-03-14 ENCOUNTER — Encounter (HOSPITAL_COMMUNITY)
Admission: EM | Disposition: A | Discharge status: Home / Self Care | Payer: Self-pay | Source: Home / Self Care | Attending: Internal Medicine

## 2018-03-14 DIAGNOSIS — Z885 Allergy status to narcotic agent status: Secondary | ICD-10-CM | POA: Diagnosis not present

## 2018-03-14 DIAGNOSIS — Z87891 Personal history of nicotine dependence: Secondary | ICD-10-CM | POA: Diagnosis not present

## 2018-03-14 DIAGNOSIS — N179 Acute kidney failure, unspecified: Secondary | ICD-10-CM | POA: Diagnosis not present

## 2018-03-14 DIAGNOSIS — Z91010 Allergy to peanuts: Secondary | ICD-10-CM

## 2018-03-14 DIAGNOSIS — N200 Calculus of kidney: Secondary | ICD-10-CM

## 2018-03-14 DIAGNOSIS — N210 Calculus in bladder: Secondary | ICD-10-CM | POA: Diagnosis not present

## 2018-03-14 DIAGNOSIS — N400 Enlarged prostate without lower urinary tract symptoms: Secondary | ICD-10-CM | POA: Diagnosis not present

## 2018-03-14 DIAGNOSIS — N136 Pyonephrosis: Secondary | ICD-10-CM | POA: Diagnosis present

## 2018-03-14 DIAGNOSIS — I10 Essential (primary) hypertension: Secondary | ICD-10-CM | POA: Diagnosis not present

## 2018-03-14 DIAGNOSIS — R1031 Right lower quadrant pain: Secondary | ICD-10-CM | POA: Diagnosis not present

## 2018-03-14 DIAGNOSIS — Z79899 Other long term (current) drug therapy: Secondary | ICD-10-CM

## 2018-03-14 DIAGNOSIS — N201 Calculus of ureter: Secondary | ICD-10-CM | POA: Diagnosis not present

## 2018-03-14 DIAGNOSIS — K219 Gastro-esophageal reflux disease without esophagitis: Secondary | ICD-10-CM | POA: Diagnosis not present

## 2018-03-14 DIAGNOSIS — M1990 Unspecified osteoarthritis, unspecified site: Secondary | ICD-10-CM | POA: Diagnosis present

## 2018-03-14 DIAGNOSIS — A4151 Sepsis due to Escherichia coli [E. coli]: Secondary | ICD-10-CM | POA: Diagnosis not present

## 2018-03-14 DIAGNOSIS — B9620 Unspecified Escherichia coli [E. coli] as the cause of diseases classified elsewhere: Secondary | ICD-10-CM | POA: Diagnosis present

## 2018-03-14 DIAGNOSIS — R509 Fever, unspecified: Secondary | ICD-10-CM | POA: Diagnosis not present

## 2018-03-14 DIAGNOSIS — R12 Heartburn: Secondary | ICD-10-CM | POA: Diagnosis not present

## 2018-03-14 DIAGNOSIS — Z1611 Resistance to penicillins: Secondary | ICD-10-CM | POA: Diagnosis not present

## 2018-03-14 DIAGNOSIS — N12 Tubulo-interstitial nephritis, not specified as acute or chronic: Secondary | ICD-10-CM

## 2018-03-14 DIAGNOSIS — R7881 Bacteremia: Secondary | ICD-10-CM | POA: Diagnosis not present

## 2018-03-14 DIAGNOSIS — E876 Hypokalemia: Secondary | ICD-10-CM | POA: Diagnosis not present

## 2018-03-14 DIAGNOSIS — A419 Sepsis, unspecified organism: Secondary | ICD-10-CM | POA: Diagnosis present

## 2018-03-14 DIAGNOSIS — R7301 Impaired fasting glucose: Secondary | ICD-10-CM | POA: Diagnosis present

## 2018-03-14 DIAGNOSIS — N10 Acute tubulo-interstitial nephritis: Secondary | ICD-10-CM

## 2018-03-14 DIAGNOSIS — Z7982 Long term (current) use of aspirin: Secondary | ICD-10-CM

## 2018-03-14 DIAGNOSIS — Z87442 Personal history of urinary calculi: Secondary | ICD-10-CM | POA: Diagnosis not present

## 2018-03-14 DIAGNOSIS — D72829 Elevated white blood cell count, unspecified: Secondary | ICD-10-CM | POA: Diagnosis not present

## 2018-03-14 DIAGNOSIS — N132 Hydronephrosis with renal and ureteral calculous obstruction: Secondary | ICD-10-CM | POA: Diagnosis not present

## 2018-03-14 HISTORY — PX: CYSTOSCOPY W/ URETERAL STENT PLACEMENT: SHX1429

## 2018-03-14 LAB — BLOOD CULTURE ID PANEL (REFLEXED)
ACINETOBACTER BAUMANNII: NOT DETECTED
CANDIDA ALBICANS: NOT DETECTED
CANDIDA KRUSEI: NOT DETECTED
CANDIDA PARAPSILOSIS: NOT DETECTED
Candida glabrata: NOT DETECTED
Candida tropicalis: NOT DETECTED
Carbapenem resistance: NOT DETECTED
ENTEROBACTER CLOACAE COMPLEX: NOT DETECTED
ESCHERICHIA COLI: DETECTED — AB
Enterobacteriaceae species: DETECTED — AB
Enterococcus species: NOT DETECTED
Haemophilus influenzae: NOT DETECTED
KLEBSIELLA OXYTOCA: NOT DETECTED
KLEBSIELLA PNEUMONIAE: NOT DETECTED
LISTERIA MONOCYTOGENES: NOT DETECTED
Neisseria meningitidis: NOT DETECTED
Proteus species: NOT DETECTED
Pseudomonas aeruginosa: NOT DETECTED
STAPHYLOCOCCUS AUREUS BCID: NOT DETECTED
STREPTOCOCCUS PNEUMONIAE: NOT DETECTED
STREPTOCOCCUS SPECIES: NOT DETECTED
Serratia marcescens: NOT DETECTED
Staphylococcus species: NOT DETECTED
Streptococcus agalactiae: NOT DETECTED
Streptococcus pyogenes: NOT DETECTED

## 2018-03-14 LAB — BASIC METABOLIC PANEL
ANION GAP: 11 (ref 5–15)
BUN: 24 mg/dL — ABNORMAL HIGH (ref 6–20)
CALCIUM: 9.1 mg/dL (ref 8.9–10.3)
CO2: 21 mmol/L — ABNORMAL LOW (ref 22–32)
Chloride: 105 mmol/L (ref 101–111)
Creatinine, Ser: 1.4 mg/dL — ABNORMAL HIGH (ref 0.61–1.24)
GFR, EST AFRICAN AMERICAN: 57 mL/min — AB (ref >60)
GFR, EST NON AFRICAN AMERICAN: 49 mL/min — AB (ref >60)
GLUCOSE: 201 mg/dL — AB (ref 65–99)
POTASSIUM: 3.7 mmol/L (ref 3.5–5.1)
SODIUM: 137 mmol/L (ref 135–145)

## 2018-03-14 LAB — URINALYSIS, ROUTINE W REFLEX MICROSCOPIC
Bilirubin Urine: NEGATIVE
Glucose, UA: 100 mg/dL — AB
Ketones, ur: NEGATIVE mg/dL
Nitrite: POSITIVE — AB
PROTEIN: NEGATIVE mg/dL
Specific Gravity, Urine: 1.015 (ref 1.005–1.030)
pH: 6 (ref 5.0–8.0)

## 2018-03-14 LAB — URINALYSIS, MICROSCOPIC (REFLEX)

## 2018-03-14 LAB — CBC WITH DIFFERENTIAL/PLATELET
BASOS ABS: 0 10*3/uL (ref 0.0–0.1)
BASOS PCT: 0 %
Eosinophils Absolute: 0 10*3/uL (ref 0.0–0.7)
Eosinophils Relative: 0 %
HCT: 42.3 % (ref 39.0–52.0)
Hemoglobin: 13.7 g/dL (ref 13.0–17.0)
Lymphocytes Relative: 1 %
Lymphs Abs: 0.3 10*3/uL — ABNORMAL LOW (ref 0.7–4.0)
MCH: 30 pg (ref 26.0–34.0)
MCHC: 32.4 g/dL (ref 30.0–36.0)
MCV: 92.6 fL (ref 78.0–100.0)
MONO ABS: 0.6 10*3/uL (ref 0.1–1.0)
Monocytes Relative: 2 %
NEUTROS ABS: 23.7 10*3/uL — AB (ref 1.7–7.7)
Neutrophils Relative %: 97 %
PLATELETS: 236 10*3/uL (ref 150–400)
RBC: 4.57 MIL/uL (ref 4.22–5.81)
RDW: 14.4 % (ref 11.5–15.5)
WBC: 24.7 10*3/uL — AB (ref 4.0–10.5)

## 2018-03-14 LAB — I-STAT CG4 LACTIC ACID, ED: LACTIC ACID, VENOUS: 1.92 mmol/L — AB (ref 0.5–1.9)

## 2018-03-14 LAB — LACTIC ACID, PLASMA: LACTIC ACID, VENOUS: 3.1 mmol/L — AB (ref 0.5–1.9)

## 2018-03-14 SURGERY — CYSTOSCOPY, WITH RETROGRADE PYELOGRAM AND URETERAL STENT INSERTION
Anesthesia: General | Site: Ureter | Laterality: Right

## 2018-03-14 MED ORDER — DEXAMETHASONE SODIUM PHOSPHATE 10 MG/ML IJ SOLN
INTRAMUSCULAR | Status: DC | PRN
  Administered 2018-03-14: 10 mg via INTRAVENOUS

## 2018-03-14 MED ORDER — DEXAMETHASONE SODIUM PHOSPHATE 10 MG/ML IJ SOLN
INTRAMUSCULAR | Status: AC
  Filled 2018-03-14: qty 1

## 2018-03-14 MED ORDER — ACETAMINOPHEN 10 MG/ML IV SOLN: 1000.0000 mg | Freq: Once | INTRAVENOUS | Status: DC | PRN

## 2018-03-14 MED ORDER — MORPHINE SULFATE (PF) 4 MG/ML IV SOLN: 1.0000 mg | INTRAVENOUS | Status: DC | PRN

## 2018-03-14 MED ORDER — FENTANYL CITRATE (PF) 100 MCG/2ML IJ SOLN
INTRAMUSCULAR | Status: DC | PRN
  Administered 2018-03-14: 25 ug via INTRAVENOUS
  Administered 2018-03-14: 50 ug via INTRAVENOUS

## 2018-03-14 MED ORDER — FENTANYL CITRATE (PF) 100 MCG/2ML IJ SOLN
INTRAMUSCULAR | Status: AC
  Filled 2018-03-14: qty 2

## 2018-03-14 MED ORDER — MEPERIDINE HCL 50 MG/ML IJ SOLN: 6.2500 mg | INTRAMUSCULAR | Status: DC | PRN

## 2018-03-14 MED ORDER — FINASTERIDE 5 MG PO TABS
5.0000 mg | ORAL_TABLET | Freq: Every day | ORAL | Status: DC
  Administered 2018-03-14: 5 mg via ORAL
  Filled 2018-03-14: qty 1

## 2018-03-14 MED ORDER — SODIUM CHLORIDE 0.9 % IV SOLN
1.0000 g | Freq: Once | INTRAVENOUS | Status: AC
  Administered 2018-03-14: 1 g via INTRAVENOUS
  Filled 2018-03-14: qty 10

## 2018-03-14 MED ORDER — LIDOCAINE 2% (20 MG/ML) 5 ML SYRINGE
INTRAMUSCULAR | Status: AC
  Filled 2018-03-14: qty 5

## 2018-03-14 MED ORDER — SODIUM CHLORIDE 0.9 % IV SOLN
1.0000 g | INTRAVENOUS | Status: DC
  Filled 2018-03-14: qty 10

## 2018-03-14 MED ORDER — PROMETHAZINE HCL 25 MG/ML IJ SOLN: 6.2500 mg | INTRAMUSCULAR | Status: DC | PRN

## 2018-03-14 MED ORDER — SODIUM CHLORIDE 0.9 % IV BOLUS (SEPSIS)
1000.0000 mL | Freq: Once | INTRAVENOUS | Status: AC
  Administered 2018-03-14: 1000 mL via INTRAVENOUS

## 2018-03-14 MED ORDER — SODIUM CHLORIDE 0.9 % IV SOLN
INTRAVENOUS | Status: DC
  Administered 2018-03-14 – 2018-03-16 (×5): via INTRAVENOUS

## 2018-03-14 MED ORDER — PROPOFOL 10 MG/ML IV BOLUS
INTRAVENOUS | Status: AC
  Filled 2018-03-14: qty 20

## 2018-03-14 MED ORDER — ONDANSETRON HCL 4 MG/2ML IJ SOLN
INTRAMUSCULAR | Status: DC | PRN
  Administered 2018-03-14: 4 mg via INTRAVENOUS

## 2018-03-14 MED ORDER — SODIUM CHLORIDE 0.9 % IV SOLN
2.0000 g | Freq: Every day | INTRAVENOUS | Status: DC
  Administered 2018-03-14 – 2018-03-15 (×2): 2 g via INTRAVENOUS
  Filled 2018-03-14 (×2): qty 2

## 2018-03-14 MED ORDER — ENOXAPARIN SODIUM 40 MG/0.4ML ~~LOC~~ SOLN
40.0000 mg | SUBCUTANEOUS | Status: DC
  Administered 2018-03-15 – 2018-03-16 (×2): 40 mg via SUBCUTANEOUS
  Filled 2018-03-14 (×2): qty 0.4

## 2018-03-14 MED ORDER — ACETAMINOPHEN 325 MG PO TABS
650.0000 mg | ORAL_TABLET | ORAL | Status: DC | PRN
Start: 2018-03-14 — End: 2018-03-16

## 2018-03-14 MED ORDER — DUTASTERIDE 0.5 MG PO CAPS
0.5000 mg | ORAL_CAPSULE | Freq: Every day | ORAL | Status: DC
  Administered 2018-03-14 – 2018-03-16 (×3): 0.5 mg via ORAL
  Filled 2018-03-14 (×3): qty 1

## 2018-03-14 MED ORDER — PROPOFOL 10 MG/ML IV BOLUS
INTRAVENOUS | Status: DC | PRN
  Administered 2018-03-14: 130 mg via INTRAVENOUS

## 2018-03-14 MED ORDER — HYDROCODONE-ACETAMINOPHEN 7.5-325 MG PO TABS: 1.0000 | ORAL_TABLET | Freq: Once | ORAL | Status: DC | PRN

## 2018-03-14 MED ORDER — IOHEXOL 350 MG/ML SOLN
INTRAVENOUS | Status: DC | PRN
  Administered 2018-03-14: 10 mL

## 2018-03-14 MED ORDER — ONDANSETRON HCL 4 MG/2ML IJ SOLN
INTRAMUSCULAR | Status: AC
  Filled 2018-03-14: qty 2

## 2018-03-14 MED ORDER — LACTATED RINGERS IV SOLN
INTRAVENOUS | Status: DC
  Administered 2018-03-14: 12:00:00 via INTRAVENOUS

## 2018-03-14 MED ORDER — ONDANSETRON HCL 4 MG/2ML IJ SOLN: 4.0000 mg | Freq: Four times a day (QID) | INTRAMUSCULAR | Status: DC | PRN

## 2018-03-14 MED ORDER — SODIUM CHLORIDE 0.9 % IR SOLN
Status: DC | PRN
  Administered 2018-03-14: 3000 mL

## 2018-03-14 MED ORDER — HYDROMORPHONE HCL 1 MG/ML IJ SOLN: 0.2500 mg | INTRAMUSCULAR | Status: DC | PRN

## 2018-03-14 MED ORDER — PANTOPRAZOLE SODIUM 40 MG PO TBEC
40.0000 mg | DELAYED_RELEASE_TABLET | Freq: Every day | ORAL | Status: DC
  Administered 2018-03-14 – 2018-03-16 (×3): 40 mg via ORAL
  Filled 2018-03-14 (×3): qty 1

## 2018-03-14 MED ORDER — MORPHINE SULFATE (PF) 2 MG/ML IV SOLN: 1.0000 mg | INTRAVENOUS | Status: DC | PRN

## 2018-03-14 SURGICAL SUPPLY — 13 items
BAG URO CATCHER STRL LF (MISCELLANEOUS) ×3 IMPLANT
CATH INTERMIT  6FR 70CM (CATHETERS) ×3 IMPLANT
CLOTH BEACON ORANGE TIMEOUT ST (SAFETY) ×3 IMPLANT
COVER FOOTSWITCH UNIV (MISCELLANEOUS) IMPLANT
COVER SURGICAL LIGHT HANDLE (MISCELLANEOUS) ×3 IMPLANT
GLOVE BIOGEL M STRL SZ7.5 (GLOVE) ×3 IMPLANT
GOWN STRL REUS W/TWL LRG LVL3 (GOWN DISPOSABLE) ×6 IMPLANT
GUIDEWIRE STR DUAL SENSOR (WIRE) ×3 IMPLANT
MANIFOLD NEPTUNE II (INSTRUMENTS) ×3 IMPLANT
PACK CYSTO (CUSTOM PROCEDURE TRAY) ×3 IMPLANT
STENT URET 6FRX26 CONTOUR (STENTS) ×2 IMPLANT
TUBING CONNECTING 10 (TUBING) ×2 IMPLANT
TUBING CONNECTING 10' (TUBING) ×1

## 2018-03-14 NOTE — Anesthesia Preprocedure Evaluation (Addendum)
Anesthesia Evaluation  Patient identified by MRN, date of birth, ID band Patient awake    Reviewed: Allergy & Precautions, NPO status , Patient's Chart, lab work & pertinent test results  Airway Mallampati: II  TM Distance: >3 FB Neck ROM: Full    Dental no notable dental hx.    Pulmonary neg pulmonary ROS, former smoker,    Pulmonary exam normal breath sounds clear to auscultation       Cardiovascular Exercise Tolerance: Good negative cardio ROS Normal cardiovascular exam Rhythm:Regular Rate:Normal     Neuro/Psych negative neurological ROS     GI/Hepatic   Endo/Other    Renal/GU      Musculoskeletal   Abdominal   Peds  Hematology   Anesthesia Other Findings   Reproductive/Obstetrics                            Lab Results  Component Value Date   WBC 24.7 (H) 03/14/2018   HGB 13.7 03/14/2018   HCT 42.3 03/14/2018   MCV 92.6 03/14/2018   PLT 236 03/14/2018   Lab Results  Component Value Date   CREATININE 1.40 (H) 03/14/2018   BUN 24 (H) 03/14/2018   NA 137 03/14/2018   K 3.7 03/14/2018   CL 105 03/14/2018   CO2 21 (L) 03/14/2018    Anesthesia Physical Anesthesia Plan  ASA: II  Anesthesia Plan: General   Post-op Pain Management:    Induction: Intravenous  PONV Risk Score and Plan: 1 and Treatment may vary due to age or medical condition, Ondansetron and Dexamethasone  Airway Management Planned: LMA  Additional Equipment:   Intra-op Plan:   Post-operative Plan:   Informed Consent: I have reviewed the patients History and Physical, chart, labs and discussed the procedure including the risks, benefits and alternatives for the proposed anesthesia with the patient or authorized representative who has indicated his/her understanding and acceptance.     Plan Discussed with: CRNA  Anesthesia Plan Comments:         Anesthesia Quick Evaluation

## 2018-03-14 NOTE — Anesthesia Procedure Notes (Signed)
Procedure Name: LMA Insertion Date/Time: 03/14/2018 12:18 PM Performed by: Glory Buff, CRNA Pre-anesthesia Checklist: Patient identified, Emergency Drugs available, Suction available and Patient being monitored Patient Re-evaluated:Patient Re-evaluated prior to induction Oxygen Delivery Method: Circle system utilized Preoxygenation: Pre-oxygenation with 100% oxygen LMA: LMA inserted LMA Size: 5.0 Number of attempts: 1 Placement Confirmation: positive ETCO2 Tube secured with: Tape Dental Injury: Teeth and Oropharynx as per pre-operative assessment

## 2018-03-14 NOTE — ED Notes (Signed)
ED Provider at bedside. 

## 2018-03-14 NOTE — ED Notes (Signed)
Updated report given, pt care transferred to [facility] staff at bedside. Pt is awake, alert, smiling in nad.

## 2018-03-14 NOTE — ED Provider Notes (Signed)
Dr Gloriann Loan aware of the patient. Well appearing at this time. Plan for operative mgmt. Received abx. Admit to medicine. Nontoxic at this time.      [name], MD 03/14/18 [phone]

## 2018-03-14 NOTE — ED Notes (Signed)
Report called to short stay RN. 

## 2018-03-14 NOTE — Consult Note (Signed)
H&P Physician requesting consult: Eddie Schmidt, MD  Chief Complaint: Right ureteral calculus, flank pain  History of Present Illness: 72 year old male with a history of nephrolithiasis requiring intervention in the past who is a patient of Dr. Diona Fanti.  He presented to [facility] this morning with right-sided flank pain and a fever.  White blood cell count was 24.  He is feeling improved but remains tachycardic.  Temperature has normalized.  He received Rocephin at 730 this morning.  He continues to have some right-sided flank pain that has improved a small amount.  Past Medical History:  Diagnosis Date  . Arthritis   . GERD (gastroesophageal reflux disease)   . H/O hiatal hernia   . Hernia 01/2013  . Kidney stone 2008   Past Surgical History:  Procedure Laterality Date  . CYSTOSCOPY WITH RETROGRADE PYELOGRAM, URETEROSCOPY AND STENT PLACEMENT Right 04/05/2016   Procedure: CYSTOSCOPY WITH RETROGRADE PYELOGRAM, URETEROSCOPY AND STENT PLACEMENT;  Surgeon: Franchot Gallo, MD;  Location: [facility];  Service: Urology;  Laterality: Right;  . CYSTOSCOPY WITH STENT PLACEMENT Left 04/16/2014   Procedure: CYSTOSCOPY WITH STENT PLACEMENT;  Surgeon: Irine Seal, MD;  Location: [facility];  Service: Urology;  Laterality: Left;  . CYSTOSCOPY WITH URETEROSCOPY AND STENT PLACEMENT Right 06/14/2016   Procedure: CYSTOSCOPY WITH RIGHT URETEROSCOPIC STENT EXTRACTION;  Surgeon: Franchot Gallo, MD;  Location: [facility];  Service: Urology;  Laterality: Right;  . EXTRACORPOREAL SHOCK WAVE LITHOTRIPSY Right 2014  . HERNIA REPAIR     left  . KNEE ARTHROSCOPY Left 2008  . ORCHIECTOMY Right 1971  . teeth pulled  2010    Home Medications:   (Not in a hospital admission) Allergies:  Allergies  Allergen Reactions  . Peanut-Containing Drug Products Anaphylaxis and Other (See Comments)    Patient has Epipen to treat this- anaphylaxis shock  . Novocain [Procaine Hcl] Itching, Rash and Other (See  Comments)    Low grade fever    Family History  Problem Relation Age of Onset  . Diverticulitis Mother   . Colon cancer Neg Hx    Social History:  reports that he quit smoking about 8 years ago. His smoking use included cigarettes, pipe, and cigars. He smoked 1.50 packs per day. he has never used smokeless tobacco. He reports that he drinks about 1.2 oz of alcohol per week. He reports that he does not use drugs.  ROS: A complete review of systems was performed.  All systems are negative except for pertinent findings as noted. ROS   Physical Exam:  Vital signs in last 24 hours: Temp:  [98.3 F (36.8 C)-98.8 F (37.1 C)] 98.8 F (37.1 C) (03/19 0840) Pulse Rate:  [100-122] 101 (03/19 1000) Resp:  [14-20] 14 (03/19 1000) BP: (130-147)/(84-97) 133/91 (03/19 1000) SpO2:  [92 %-96 %] 94 % (03/19 1000) Weight:  [88.9 kg (196 lb)] 88.9 kg (196 lb) (03/19 2355) General:  Alert and oriented, No acute distress HEENT: Normocephalic, atraumatic Neck: No JVD or lymphadenopathy Cardiovascular: Sinus tachycardia but normotensive Lungs: Regular rate and effort Abdomen: Soft, nontender, nondistended, no abdominal masses Back: Mild right CVA tenderness Extremities: No edema Neurologic: Grossly intact  Laboratory Data:  Results for orders placed or performed during the hospital encounter of 03/14/18 (from the past 24 hour(s))  Basic metabolic panel     Status: Abnormal   Collection Time: 03/14/18  6:51 AM  Result Value Ref Range   Sodium 137 135 - 145 mmol/L   Potassium 3.7 3.5 - 5.1 mmol/L  Chloride 105 101 - 111 mmol/L   CO2 21 (L) 22 - 32 mmol/L   Glucose, Bld 201 (H) 65 - 99 mg/dL   BUN 24 (H) 6 - 20 mg/dL   Creatinine, Ser 1.40 (H) 0.61 - 1.24 mg/dL   Calcium 9.1 8.9 - 10.3 mg/dL   GFR calc non Af Amer 49 (L) >60 mL/min   GFR calc Af Amer 57 (L) >60 mL/min   Anion gap 11 5 - 15  CBC with Differential     Status: Abnormal   Collection Time: 03/14/18  6:51 AM  Result Value Ref  Range   WBC 24.7 (H) 4.0 - 10.5 K/uL   RBC 4.57 4.22 - 5.81 MIL/uL   Hemoglobin 13.7 13.0 - 17.0 g/dL   HCT 42.3 39.0 - 52.0 %   MCV 92.6 78.0 - 100.0 fL   MCH 30.0 26.0 - 34.0 pg   MCHC 32.4 30.0 - 36.0 g/dL   RDW 14.4 11.5 - 15.5 %   Platelets 236 150 - 400 K/uL   Neutrophils Relative % 97 %   Neutro Abs 23.7 (H) 1.7 - 7.7 K/uL   Lymphocytes Relative 1 %   Lymphs Abs 0.3 (L) 0.7 - 4.0 K/uL   Monocytes Relative 2 %   Monocytes Absolute 0.6 0.1 - 1.0 K/uL   Eosinophils Relative 0 %   Eosinophils Absolute 0.0 0.0 - 0.7 K/uL   Basophils Relative 0 %   Basophils Absolute 0.0 0.0 - 0.1 K/uL  I-Stat CG4 Lactic Acid, ED     Status: Abnormal   Collection Time: 03/14/18  6:54 AM  Result Value Ref Range   Lactic Acid, Venous 1.92 (H) 0.5 - 1.9 mmol/L  Urinalysis, Routine w reflex microscopic     Status: Abnormal   Collection Time: 03/14/18  7:30 AM  Result Value Ref Range   Color, Urine YELLOW YELLOW   APPearance CLOUDY (A) CLEAR   Specific Gravity, Urine 1.015 1.005 - 1.030   pH 6.0 5.0 - 8.0   Glucose, UA 100 (A) NEGATIVE mg/dL   Hgb urine dipstick SMALL (A) NEGATIVE   Bilirubin Urine NEGATIVE NEGATIVE   Ketones, ur NEGATIVE NEGATIVE mg/dL   Protein, ur NEGATIVE NEGATIVE mg/dL   Nitrite POSITIVE (A) NEGATIVE   Leukocytes, UA SMALL (A) NEGATIVE  Urinalysis, Microscopic (reflex)     Status: Abnormal   Collection Time: 03/14/18  7:30 AM  Result Value Ref Range   RBC / HPF 6-30 0 - 5 RBC/hpf   WBC, UA TOO NUMEROUS TO COUNT 0 - 5 WBC/hpf   Bacteria, UA MANY (A) NONE SEEN   Squamous Epithelial / LPF 0-5 (A) NONE SEEN   WBC Clumps PRESENT    No results found for this or any previous visit (from the past 240 hour(s)). Creatinine: Recent Labs    03/14/18 0651  CREATININE 1.40*    Impression/Assessment:  Right ureteral calculus Urinary tract infection, possible bacteremia  Plan:  Hospitalist consult for admission and treatment of infection Proceed urgently to the operating  room for cystoscopy with right ureteral stent placement.  Marton Redwood, III 03/14/2018, 10:57 AM

## 2018-03-14 NOTE — ED Triage Notes (Signed)
Pt reports onset of right flank pain about 4 am this morning. Pt was seen at [facility] over the weekend, reports a 67mm stone on the ct scan he had there. He took tylenol for the pain and fever about 4 am this morning and also having dry heaves. Pt is known to [facility].

## 2018-03-14 NOTE — Op Note (Signed)
Operative Note  Preoperative diagnosis:  1.  Right ureteral calculus, urinary tract infection with possible bacteremia  Post operative diagnosis: 1.  Right ureteral calculus, urinary tract infection with possible bacteremia  Procedure(s): 1.  Cystoscopy with right retrograde pyelogram and right ureteral stent placement  Surgeon: Eddie Snuffer, MD  Assistants: None  Anesthesia: General  Complications: None immediate  EBL: Minimal  Specimens: 1.  None  Drains/Catheters: 1.  6 X 26 double-J ureteral stent  Intraoperative findings: 1.  Normal anterior urethra, prostate borderline obstructing with mild hyperplasia, and bladder normal except for a 1.5 cm bladder calculus.  Otherwise normal mucosa 2.  Right retrograde pyelogram revealed a filling defect at the level of the stone with upstream hydroureteronephrosis  Indication: 72 year old male with a history of nephrolithiasis who is a patient of Dr. Diona Fanti.  He has had multiple procedures in the past.  He presented today to an outside hospital with right-sided flank pain.  CT scan had already confirmed a 6 mm ureteral calculus on the right.  The patient had a white blood cell count of 24 and a fever.  Given these findings, he was transferred here and urgently taken to the operating room.  Description of procedure:  The patient was identified and consent was obtained.  The patient was taken to the operating room and placed in the supine position.  The patient was placed under general anesthesia.  Perioperative antibiotics were administered.  The patient was placed in dorsal lithotomy.  Patient was prepped and draped in a standard sterile fashion and a timeout was performed.  A 21 French rigid cystoscope was advanced into the urethra and into the bladder.  The right distal most portion of the ureter was cannulated with an open-ended ureteral catheter.  Retrograde pyelogram was performed with the findings noted above.  A sensor wire was  then advanced up to the kidney under fluoroscopic guidance.  A 6 X 26 double-J ureteral stent was advanced up to the kidney under fluoroscopic guidance.  The wire was withdrawn and fluoroscopy confirmed good proximal placement and direct visualization confirmed a good coil within the bladder.  The bladder was drained and the scope withdrawn.  This concluded the operation.  Patient tolerated procedure well and was stable postoperatively.  Plan: Patient will be transferred to the medicine service and be treated for his urinary tract infection with possible sepsis.  He will continue his ureteral stent.  After an adequate amount of time of treatment, he can undergo definitive management of the right ureteral calculus and bladder calculus.

## 2018-03-14 NOTE — ED Notes (Signed)
Report given to [place] at [facility]. [facility] is en route.

## 2018-03-14 NOTE — H&P (Signed)
History and Physical    Eddie Walker IWP:[id number] DOB: 1946-10-29 DOA: 03/14/2018  PCP: Shon Baton, MD  Patient coming from: Home.   I have personally briefly reviewed patient's old medical records in [place]  Chief Complaint:  Right flank pain, nausea, vomiting and fever chills since 2 days.   HPI: Eddie Walker is a 72 y.o. male with medical history significant of renal stones, GERD, comes in for persistent rigth flank pain, associated with nausea, vomiting, fever and chills. Pt sees Dr Dorina Hoyer as outpatient. Pt presented to [facility] with these complaints and was transferred to [facility]. He denies any abdominal pain, headches, dizziness, sob, chest pain or cough. He was referred to medical service for pyelonephritis. ED work up revealed wbc count of 24.7 and creatinine of 1.4, lactic acid of 1.92. Urology consulted by EDP.    Review of Systems: As per HPI otherwise 10 point review of systems negative.    Past Medical History:  Diagnosis Date  . Arthritis   . GERD (gastroesophageal reflux disease)   . H/O hiatal hernia   . Hernia 01/2013  . Kidney stone 2008    Past Surgical History:  Procedure Laterality Date  . CYSTOSCOPY WITH RETROGRADE PYELOGRAM, URETEROSCOPY AND STENT PLACEMENT Right 04/05/2016   Procedure: CYSTOSCOPY WITH RETROGRADE PYELOGRAM, URETEROSCOPY AND STENT PLACEMENT;  Surgeon: Franchot Gallo, MD;  Location: [facility];  Service: Urology;  Laterality: Right;  . CYSTOSCOPY WITH STENT PLACEMENT Left 04/16/2014   Procedure: CYSTOSCOPY WITH STENT PLACEMENT;  Surgeon: Irine Seal, MD;  Location: [facility];  Service: Urology;  Laterality: Left;  . CYSTOSCOPY WITH URETEROSCOPY AND STENT PLACEMENT Right 06/14/2016   Procedure: CYSTOSCOPY WITH RIGHT URETEROSCOPIC STENT EXTRACTION;  Surgeon: Franchot Gallo, MD;  Location: [facility];  Service: Urology;  Laterality: Right;  . EXTRACORPOREAL SHOCK WAVE LITHOTRIPSY Right 2014  . HERNIA REPAIR     left  . KNEE ARTHROSCOPY Left  2008  . ORCHIECTOMY Right 1971  . teeth pulled  2010     reports that he quit smoking about 8 years ago. His smoking use included cigarettes, pipe, and cigars. He smoked 1.50 packs per day. he has never used smokeless tobacco. He reports that he drinks about 1.2 oz of alcohol per week. He reports that he does not use drugs.  Allergies  Allergen Reactions  . Peanut-Containing Drug Products Anaphylaxis and Other (See Comments)    Patient has Epipen to treat this- anaphylaxis shock  . Novocain [Procaine Hcl] Itching, Rash and Other (See Comments)    Low grade fever    Family History  Problem Relation Age of Onset  . Diverticulitis Mother   . Colon cancer Neg Hx    Reviewed.   Prior to Admission medications   Medication Sig Start Date End Date Taking? Authorizing Provider  acetaminophen (TYLENOL) 500 MG tablet Take 1,000 mg by mouth every 6 (six) hours as needed for fever.    [provider]  aspirin EC 81 MG tablet Take 81 mg by mouth daily.    [provider]  dutasteride (AVODART) 0.5 MG capsule Take 0.5 mg by mouth daily.    [provider]  EPINEPHrine (EPIPEN) 0.3 mg/0.3 mL SOAJ injection Inject 0.3 mg into the muscle once as needed (For anaphylaxis.).     [provider]  finasteride (PROSCAR) 5 MG tablet Take 5 mg by mouth daily. 04/10/16   [provider]  glucosamine-chondroitin 500-400 MG tablet Take 1 tablet by mouth daily.  [provider]  lisinopril (PRINIVIL,ZESTRIL) 5 MG tablet Take 1 tablet by mouth daily. 08/16/17   [provider]  Multiple Vitamin (MULTIVITAMIN WITH MINERALS) TABS Take 1 tablet by mouth daily.    [provider]  omeprazole (PRILOSEC) 40 MG capsule Take 40 mg by mouth daily. 04/17/16   [provider]  VIAGRA 100 MG tablet Take 100 mg by mouth daily as needed for erectile dysfunction.  04/15/16   [provider]    Physical Exam: Vitals:   03/14/18 1000  03/14/18 1100 03/14/18 1200 03/14/18 1252  BP: (!) 133/91 138/85 133/87 (P) 111/75  Pulse: (!) 101 (!) 106 (!) 110 (P) 96  Resp: 14 14 18  (P) 15  Temp:   99.6 F (37.6 C) (P) 99.2 F (37.3 C)  TempSrc:   Oral   SpO2: 94% 93% 95% (P) 98%  Weight:      Height:        Constitutional: appears uncomfortable.  Vitals:   03/14/18 1000 03/14/18 1100 03/14/18 1200 03/14/18 1252  BP: (!) 133/91 138/85 133/87 (P) 111/75  Pulse: (!) 101 (!) 106 (!) 110 (P) 96  Resp: 14 14 18  (P) 15  Temp:   99.6 F (37.6 C) (P) 99.2 F (37.3 C)  TempSrc:   Oral   SpO2: 94% 93% 95% (P) 98%  Weight:      Height:       Eyes: PERRL, lids and conjunctivae normal ENMT: Mucous membranes are moist. Posterior pharynx clear of any exudate or lesions.Normal dentition.  Neck: normal, supple, no masses, no thyromegaly Respiratory: clear to auscultation bilaterally, no wheezing, no crackles. Normal respiratory effort. No accessory muscle use.  Cardiovascular: Regular rate and rhythm, no murmurs / rubs / gallops. No extremity edema. 2+ pedal pulses. No carotid bruits.  Abdomen:no tenderness, rigth flank pain. Bowel sounds heard.  Musculoskeletal: no clubbing / cyanosis. No joint deformity upper and lower extremities. Good ROM, no contractures. Normal muscle tone.  Skin: no rashes, lesions, ulcers. No induration Neurologic: CN 2-12 grossly intact. Sensation intact, DTR normal. Strength 5/5 in all 4.  Psychiatric: Normal judgment and insight. Alert and oriented x 3. Normal mood.     Labs on Admission: I have personally reviewed following labs and imaging studies  CBC: Recent Labs  Lab 03/14/18 0651  WBC 24.7*  NEUTROABS 23.7*  HGB 13.7  HCT 42.3  MCV 92.6  PLT 161   Basic Metabolic Panel: Recent Labs  Lab 03/14/18 0651  NA 137  K 3.7  CL 105  CO2 21*  GLUCOSE 201*  BUN 24*  CREATININE 1.40*  CALCIUM 9.1   GFR: Estimated Creatinine Clearance: 54.7 mL/min (A) (by C-G formula based on SCr of 1.4  mg/dL (H)). Liver Function Tests: No results for input(s): AST, ALT, ALKPHOS, BILITOT, PROT, ALBUMIN in the last 168 hours. No results for input(s): LIPASE, AMYLASE in the last 168 hours. No results for input(s): AMMONIA in the last 168 hours. Coagulation Profile: No results for input(s): INR, PROTIME in the last 168 hours. Cardiac Enzymes: No results for input(s): CKTOTAL, CKMB, CKMBINDEX, TROPONINI in the last 168 hours. BNP (last 3 results) No results for input(s): PROBNP in the last 8760 hours. HbA1C: No results for input(s): HGBA1C in the last 72 hours. CBG: No results for input(s): GLUCAP in the last 168 hours. Lipid Profile: No results for input(s): CHOL, HDL, LDLCALC, TRIG, CHOLHDL, LDLDIRECT in the last 72 hours. Thyroid Function Tests: No results for input(s): TSH, T4TOTAL, FREET4,  T3FREE, THYROIDAB in the last 72 hours. Anemia Panel: No results for input(s): VITAMINB12, FOLATE, FERRITIN, TIBC, IRON, RETICCTPCT in the last 72 hours. Urine analysis:    Component Value Date/Time   COLORURINE YELLOW 03/14/2018 0730   APPEARANCEUR CLOUDY (A) 03/14/2018 0730   LABSPEC 1.015 03/14/2018 0730   PHURINE 6.0 03/14/2018 0730   GLUCOSEU 100 (A) 03/14/2018 0730   HGBUR SMALL (A) 03/14/2018 0730   BILIRUBINUR NEGATIVE 03/14/2018 0730   KETONESUR NEGATIVE 03/14/2018 0730   PROTEINUR NEGATIVE 03/14/2018 0730   UROBILINOGEN 0.2 04/15/2014 2130   NITRITE POSITIVE (A) 03/14/2018 0730   LEUKOCYTESUR SMALL (A) 03/14/2018 0730    Radiological Exams on Admission: Dg C-arm 1-60 Min-no Report  Result Date: 03/14/2018 Fluoroscopy was utilized by the requesting physician.  No radiographic interpretation.    EKG: Independently reviewed. Sinus tachycardia.   Assessment/Plan Active Problems:   Heartburn   BPH (benign prostatic hyperplasia)   Renal stone   Pyelonephritis   AKI (acute kidney injury) ([place])   Sepsis ([place])    Pyelonephritis/ with SIRS. Admit for IV antibiotics, IV  fluids, IV pain control.urology consulted and pt to undergo cystoscopy with pyelogram and possible stent placement.  Blood cultures and urine cultures done and pending.  Trend lactic acid.     Acute renal failure:  Suspect from pyelonephritis and pre renal . Hydrate and repeat renal parameters.    Tachycardia from UTI.    GERD: stable , on protonix.   DVT prophylaxis: lovenox.  Code Status: full code.  Family Communication: family at bedside.  Disposition Plan: pending urology evaluation.  Consults called: urology Dr Gloriann Loan.  Admission status: inpatient, med surg.    Hosie Poisson MD [facility] Pager [phone]  If 7PM-7AM, please contact night-coverage [email or web address] Password [email or web address]  03/14/2018, 1:05 PM

## 2018-03-14 NOTE — Progress Notes (Signed)
PHARMACY - PHYSICIAN COMMUNICATION CRITICAL VALUE ALERT - BLOOD CULTURE IDENTIFICATION (BCID)  Eddie Walker is an 72 y.o. male who presented to [facility] on 03/14/2018 with a chief complaint of right flank pan, n/v, fever and chills  Assessment:  Pyelonephritis (include suspected source if known)  Name of physician (or Provider) Contacted: Tylene Fantasia  Current antibiotics: Rocephin 1 Gm IV q24h  Changes to prescribed antibiotics recommended:  Will increase Rocephin to 2 Gm IV q24h for bacteremia  Results for orders placed or performed during the hospital encounter of 03/14/18  Blood Culture ID Panel (Reflexed) (Collected: 03/14/2018  7:02 AM)  Result Value Ref Range   Enterococcus species NOT DETECTED NOT DETECTED   Listeria monocytogenes NOT DETECTED NOT DETECTED   Staphylococcus species NOT DETECTED NOT DETECTED   Staphylococcus aureus NOT DETECTED NOT DETECTED   Streptococcus species NOT DETECTED NOT DETECTED   Streptococcus agalactiae NOT DETECTED NOT DETECTED   Streptococcus pneumoniae NOT DETECTED NOT DETECTED   Streptococcus pyogenes NOT DETECTED NOT DETECTED   Acinetobacter baumannii NOT DETECTED NOT DETECTED   Enterobacteriaceae species DETECTED (A) NOT DETECTED   Enterobacter cloacae complex NOT DETECTED NOT DETECTED   Escherichia coli DETECTED (A) NOT DETECTED   Klebsiella oxytoca NOT DETECTED NOT DETECTED   Klebsiella pneumoniae NOT DETECTED NOT DETECTED   Proteus species NOT DETECTED NOT DETECTED   Serratia marcescens NOT DETECTED NOT DETECTED   Carbapenem resistance NOT DETECTED NOT DETECTED   Haemophilus influenzae NOT DETECTED NOT DETECTED   Neisseria meningitidis NOT DETECTED NOT DETECTED   Pseudomonas aeruginosa NOT DETECTED NOT DETECTED   Candida albicans NOT DETECTED NOT DETECTED   Candida glabrata NOT DETECTED NOT DETECTED   Candida krusei NOT DETECTED NOT DETECTED   Candida parapsilosis NOT DETECTED NOT DETECTED   Candida tropicalis NOT DETECTED NOT  DETECTED    Dorrene German 03/14/2018  10:39 PM

## 2018-03-14 NOTE — Discharge Instructions (Signed)

## 2018-03-14 NOTE — ED Provider Notes (Signed)
[facility] EMERGENCY DEPARTMENT Provider Note   CSN: [id number] Arrival date & time: 03/14/18  0814     History   Chief Complaint Chief Complaint  Patient presents with  . Flank Pain    HPI Eddie Walker is a 72 y.o. male.  The history is provided by the patient.  He has history of kidney stones, BPH, GERD and comes in with fever and chills along with nausea and vomiting.  Temperature at home was 101, and he took a dose of acetaminophen at 4:30 AM he had been seen at the emergency department at [facility] 2 days ago and diagnosed with a kidney stone.  He continues to have right flank pain, but it is only rated at 3/10.  He came in because of the fever and vomiting.  Pain is actually adequately controlled.  Nothing makes pain better, nothing makes it worse.  Past Medical History:  Diagnosis Date  . Arthritis   . GERD (gastroesophageal reflux disease)   . H/O hiatal hernia   . Hernia 01/2013  . Kidney stone 2008    Patient Active Problem List   Diagnosis Date Noted  . Acute respiratory failure ([place]) 04/13/2016  . AKI (acute kidney injury) ([place]) 04/06/2016  . Kidney stone 04/06/2016  . Elevated blood pressure 04/06/2016  . Ureteral stone with hydronephrosis   . Sepsis ([place])   . Urinary tract infectious disease   . Pyelonephritis 04/05/2016  . Hydronephrosis of right kidney 04/05/2016  . Renal stone 04/17/2014  . Kidney stone on left side 04/15/2014  . Acute renal failure ([place]) 03/28/2013  . Sepsis syndrome 03/28/2013  . UTI (urinary tract infection) 03/28/2013  . BPH (benign prostatic hyperplasia) 03/28/2013  . HEARTBURN 03/08/2011    Past Surgical History:  Procedure Laterality Date  . CYSTOSCOPY WITH RETROGRADE PYELOGRAM, URETEROSCOPY AND STENT PLACEMENT Right 04/05/2016   Procedure: CYSTOSCOPY WITH RETROGRADE PYELOGRAM, URETEROSCOPY AND STENT PLACEMENT;  Surgeon: Franchot Gallo, MD;  Location: [facility];  Service: Urology;  Laterality: Right;    . CYSTOSCOPY WITH STENT PLACEMENT Left 04/16/2014   Procedure: CYSTOSCOPY WITH STENT PLACEMENT;  Surgeon: Irine Seal, MD;  Location: [facility];  Service: Urology;  Laterality: Left;  . CYSTOSCOPY WITH URETEROSCOPY AND STENT PLACEMENT Right 06/14/2016   Procedure: CYSTOSCOPY WITH RIGHT URETEROSCOPIC STENT EXTRACTION;  Surgeon: Franchot Gallo, MD;  Location: [facility];  Service: Urology;  Laterality: Right;  . EXTRACORPOREAL SHOCK WAVE LITHOTRIPSY Right 2014  . HERNIA REPAIR     left  . KNEE ARTHROSCOPY Left 2008  . ORCHIECTOMY Right 1971  . teeth pulled  2010       Home Medications    Prior to Admission medications   Medication Sig Start Date End Date Taking? Authorizing Provider  acetaminophen (TYLENOL) 500 MG tablet Take 1,000 mg by mouth every 6 (six) hours as needed for fever.    [provider]  aspirin EC 81 MG tablet Take 81 mg by mouth daily.    [provider]  dutasteride (AVODART) 0.5 MG capsule Take 0.5 mg by mouth daily.    [provider]  EPINEPHrine (EPIPEN) 0.3 mg/0.3 mL SOAJ injection Inject 0.3 mg into the muscle once as needed (For anaphylaxis.).     [provider]  finasteride (PROSCAR) 5 MG tablet Take 5 mg by mouth daily. 04/10/16   [provider]  glucosamine-chondroitin 500-400 MG tablet Take 1 tablet by mouth daily.     [provider]  lisinopril (PRINIVIL,ZESTRIL) 5 MG tablet  Take 1 tablet by mouth daily. 08/16/17   [provider]  Multiple Vitamin (MULTIVITAMIN WITH MINERALS) TABS Take 1 tablet by mouth daily.    [provider]  omeprazole (PRILOSEC) 40 MG capsule Take 40 mg by mouth daily. 04/17/16   [provider]  VIAGRA 100 MG tablet Take 100 mg by mouth daily as needed for erectile dysfunction.  04/15/16   [provider]    Family History Family History  Problem Relation Age of Onset  . Diverticulitis Mother   . Colon cancer Neg Hx     Social History Social  History   Tobacco Use  . Smoking status: Former Smoker    Packs/day: 1.50    Types: Cigarettes, Pipe, Cigars    Last attempt to quit: 12/05/2009    Years since quitting: 8.2  . Smokeless tobacco: Never Used  Substance Use Topics  . Alcohol use: Yes    Alcohol/week: 1.2 oz    Types: 1 Glasses of wine, 1 Shots of liquor per week  . Drug use: No     Allergies   Peanut-containing drug products and Novocain [procaine hcl]   Review of Systems Review of Systems  All other systems reviewed and are negative.    Physical Exam Updated Vital Signs BP (!) 147/97 (BP Location: Right Arm)   Pulse (!) 122   Temp 98.7 F (37.1 C) (Oral)   Resp 20   Ht 6\' 1"  (1.854 m)   Wt 88.9 kg (196 lb)   SpO2 92%   BMI 25.86 kg/m   Physical Exam  Nursing note and vitals reviewed.  72 year old male, resting comfortably and in no acute distress. Vital signs are significant for elevated heart rate and elevated blood pressure. Oxygen saturation is 92%, which is normal. Head is normocephalic and atraumatic. PERRLA, EOMI. Oropharynx is clear. Neck is nontender and supple without adenopathy or JVD. Back is nontender in the midline.  There is mild right CVA tenderness. Lungs are clear without rales, wheezes, or rhonchi. Chest is nontender. Heart has regular rate and rhythm without murmur. Abdomen is soft, flat, nontender without masses or hepatosplenomegaly and peristalsis is hypoactive. Extremities have no cyanosis or edema, full range of motion is present. Skin is warm and dry without rash. Neurologic: Mental status is normal, cranial nerves are intact, there are no motor or sensory deficits.  ED Treatments / Results  Labs (all labs ordered are listed, but only abnormal results are displayed) Labs Reviewed - No data to display  Procedures Procedures  CRITICAL CARE Performed by: Delora Fuel Total critical care time: 35 minutes Critical care time was exclusive of separately billable  procedures and treating other patients. Critical care was necessary to treat or prevent imminent or life-threatening deterioration. Critical care was time spent personally by me on the following activities: development of treatment plan with patient and/or surrogate as well as nursing, discussions with consultants, evaluation of patient's response to treatment, examination of patient, obtaining history from patient or surrogate, ordering and performing treatments and interventions, ordering and review of laboratory studies, ordering and review of radiographic studies, pulse oximetry and re-evaluation of patient's condition.  Medications Ordered in ED Medications  cefTRIAXone (ROCEPHIN) 1 g in sodium chloride 0.9 % 100 mL IVPB (not administered)     Initial Impression / Assessment and Plan / ED Course  I have reviewed the triage vital signs and the nursing notes.  Pertinent lab results that were available during my care of the patient  were reviewed by me and considered in my medical decision making (see chart for details).  Fever and chills in a patient with known obstructing kidney stone.  Old records are reviewed confirming ED visit at [facility] 2 days ago with 6 mm calculus in the right mid ureter.  He is tachycardic but not hypotensive and does not appear overtly ill.  He will be given IV fluids.  Blood and urine cultures are obtained.  He is started on IV ceftriaxone.  Lactic acid level is come back normal, so I do not feel he is septic at this point.  Case is discussed with Dr. Roni Bread of urology service who request the patient be transferred urgently to [facility] emergency department.  Case is discussed with Dr. Venora Maples, ED physician at [facility] emergency department, who agrees to accept the patient in transfer.  Final Clinical Impressions(s) / ED Diagnoses   Final diagnoses:  Acute pyelonephritis  Right ureteral stone  Pyelonephritis    Ureterolithiasis    ED Discharge Orders    None       Delora Fuel, MD 22/57/50 [phone]

## 2018-03-14 NOTE — Transfer of Care (Signed)
Immediate Anesthesia Transfer of Care Note  Patient: Eddie Walker  Procedure(s) Performed: CYSTOSCOPY WITH RETROGRADE PYELOGRAM/URETERAL RIGHT STENT PLACEMENT (Right Ureter)  Patient Location: PACU  Anesthesia Type:General  Level of Consciousness: awake, alert  and oriented  Airway & Oxygen Therapy: Patient Spontanous Breathing and Patient connected to face mask oxygen  Post-op Assessment: Report given to RN and Post -op Vital signs reviewed and stable  Post vital signs: Reviewed and stable  Last Vitals:  Vitals:   03/14/18 1100 03/14/18 1200  BP: 138/85 133/87  Pulse: (!) 106 (!) 110  Resp: 14 18  Temp:  37.6 C  SpO2: 93% 95%    Last Pain:  Vitals:   03/14/18 1200  TempSrc: Oral  PainSc:       Patients Stated Pain Goal: 4 (89/38/10 1751)  Complications: No apparent anesthesia complications

## 2018-03-15 ENCOUNTER — Encounter (HOSPITAL_COMMUNITY): Payer: Self-pay | Admitting: Urology

## 2018-03-15 DIAGNOSIS — R7881 Bacteremia: Secondary | ICD-10-CM

## 2018-03-15 DIAGNOSIS — A4151 Sepsis due to Escherichia coli [E. coli]: Secondary | ICD-10-CM

## 2018-03-15 DIAGNOSIS — N10 Acute tubulo-interstitial nephritis: Secondary | ICD-10-CM

## 2018-03-15 DIAGNOSIS — N201 Calculus of ureter: Secondary | ICD-10-CM

## 2018-03-15 LAB — CBC
HCT: 37.7 % — ABNORMAL LOW (ref 39.0–52.0)
Hemoglobin: 11.8 g/dL — ABNORMAL LOW (ref 13.0–17.0)
MCH: 29.9 pg (ref 26.0–34.0)
MCHC: 31.3 g/dL (ref 30.0–36.0)
MCV: 95.4 fL (ref 78.0–100.0)
PLATELETS: 231 10*3/uL (ref 150–400)
RBC: 3.95 MIL/uL — AB (ref 4.22–5.81)
RDW: 14.6 % (ref 11.5–15.5)
WBC: 21.1 10*3/uL — ABNORMAL HIGH (ref 4.0–10.5)

## 2018-03-15 LAB — BASIC METABOLIC PANEL
Anion gap: 8 (ref 5–15)
BUN: 25 mg/dL — ABNORMAL HIGH (ref 6–20)
CO2: 23 mmol/L (ref 22–32)
Calcium: 8.5 mg/dL — ABNORMAL LOW (ref 8.9–10.3)
Chloride: 110 mmol/L (ref 101–111)
Creatinine, Ser: 1.2 mg/dL (ref 0.61–1.24)
GFR calc Af Amer: >60 mL/min (ref >60)
GFR, EST NON AFRICAN AMERICAN: 59 mL/min — AB (ref >60)
GLUCOSE: 196 mg/dL — AB (ref 65–99)
POTASSIUM: 4.1 mmol/L (ref 3.5–5.1)
Sodium: 141 mmol/L (ref 135–145)

## 2018-03-15 LAB — LACTIC ACID, PLASMA: Lactic Acid, Venous: 1.4 mmol/L (ref 0.5–1.9)

## 2018-03-15 MED ORDER — TAMSULOSIN HCL 0.4 MG PO CAPS
0.4000 mg | ORAL_CAPSULE | Freq: Every day | ORAL | Status: DC
  Administered 2018-03-16: 0.4 mg via ORAL
  Filled 2018-03-15: qty 1

## 2018-03-15 MED ORDER — ASPIRIN EC 81 MG PO TBEC
81.0000 mg | DELAYED_RELEASE_TABLET | Freq: Every day | ORAL | Status: DC
  Administered 2018-03-16: 81 mg via ORAL
  Filled 2018-03-15: qty 1

## 2018-03-15 NOTE — Progress Notes (Signed)
Urology Inpatient Progress Report  KIDNEY STONE PAIN  Procedure(s): CYSTOSCOPY WITH RETROGRADE PYELOGRAM/URETERAL RIGHT STENT PLACEMENT  1 Day Post-Op   Intv/Subj: No acute events overnight. Patient is without complaint. Afebrile vital signs stable.  Heart rate has improved. Blood cultures positive for gram-negative rods  Active Problems:   Heartburn   BPH (benign prostatic hyperplasia)   Renal stone   Pyelonephritis   AKI (acute kidney injury) ([place])   Sepsis ([place])  Current Facility-Administered Medications  Medication Dose Route Frequency Provider Last Rate Last Dose  . 0.9 %  sodium chloride infusion   Intravenous Continuous Hosie Poisson, MD 125 mL/hr at 03/15/18 3846    . acetaminophen (TYLENOL) tablet 650 mg  650 mg Oral Q4H PRN Hosie Poisson, MD      . cefTRIAXone (ROCEPHIN) 2 g in sodium chloride 0.9 % 100 mL IVPB  2 g Intravenous QHS Dorrene German, [place] at 03/14/18 2334  . dutasteride (AVODART) capsule 0.5 mg  0.5 mg Oral Daily Hosie Poisson, MD   0.5 mg at 03/14/18 1631  . enoxaparin (LOVENOX) injection 40 mg  40 mg Subcutaneous Q24H Hosie Poisson, MD      . finasteride (PROSCAR) tablet 5 mg  5 mg Oral Daily Hosie Poisson, MD   5 mg at 03/14/18 1631  . morphine 4 MG/ML injection 1-2 mg  1-2 mg Intravenous Q4H PRN Marton Redwood III, MD      . ondansetron [facility]) injection 4 mg  4 mg Intravenous Q6H PRN Hosie Poisson, MD      . pantoprazole (PROTONIX) EC tablet 40 mg  40 mg Oral Daily Hosie Poisson, MD   40 mg at 03/14/18 1631     Objective: Vital: Vitals:   03/14/18 1330 03/14/18 1336 03/14/18 2029 03/15/18 0622  BP:  135/82 132/81 120/77  Pulse:  91 (!) 112 79  Resp:  15 16 16   Temp: 98 F (36.7 C) 98.7 F (37.1 C) 98.7 F (37.1 C) 98.3 F (36.8 C)  TempSrc:   Oral Oral  SpO2:  95% 95% 94%  Weight:      Height:       I/Os: I/O last 3 completed shifts: In: 5624.2 [P.O.:520; I.V.:2904.2; Other:1000; IV Piggyback:1200] Out: 850  [Urine:850]  Physical Exam:  General: Patient is in no apparent distress Lungs: Normal respiratory effort, chest expands symmetrically. GI:The abdomen is soft and nontender without mass. Ext: lower extremities symmetric  Lab Results: Recent Labs    03/14/18 0651 03/15/18 0426  WBC 24.7* 21.1*  HGB 13.7 11.8*  HCT 42.3 37.7*   Recent Labs    03/14/18 0651 03/15/18 0426  NA 137 141  K 3.7 4.1  CL 105 110  CO2 21* 23  GLUCOSE 201* 196*  BUN 24* 25*  CREATININE 1.40* 1.20  CALCIUM 9.1 8.5*   No results for input(s): LABPT, INR in the last 72 hours. No results for input(s): LABURIN in the last 72 hours. Results for orders placed or performed during the hospital encounter of 03/14/18  Culture, blood (routine x 2)     Status: None (Preliminary result)   Collection Time: 03/14/18  6:51 AM  Result Value Ref Range Status   Specimen Description   Final    BLOOD LEFT ANTECUBITAL Performed at [facility], [address] [place], [place] [address]    Special Requests   Final    BOTTLES DRAWN AEROBIC AND ANAEROBIC Blood Culture adequate volume Performed at [facility]  [address] [place] [place], [place] [address]    Culture  Setup Time   Final    GRAM NEGATIVE RODS IN BOTH AEROBIC AND ANAEROBIC BOTTLES CRITICAL VALUE NOTED.  VALUE IS CONSISTENT WITH PREVIOUSLY REPORTED AND CALLED VALUE. Performed at [facility], [place] [address] [place], [place] [address]    Culture GRAM NEGATIVE RODS  Final   Report Status PENDING  Incomplete  Culture, blood (routine x 2)     Status: Abnormal (Preliminary result)   Collection Time: 03/14/18  7:02 AM  Result Value Ref Range Status   Specimen Description   Final    BLOOD RIGHT HAND Performed at [facility], [address] [place], [place] [address]    Special Requests   Final    BOTTLES DRAWN AEROBIC AND ANAEROBIC Blood Culture results may not be optimal due to an inadequate volume of blood  received in culture bottles Performed at [facility], [address] [place], [place] [address]    Culture  Setup Time   Final    GRAM NEGATIVE RODS IN BOTH AEROBIC AND ANAEROBIC BOTTLES CRITICAL RESULT CALLED TO, READ BACK BY AND VERIFIED WITH: B GREEN PHARMD 03/14/18 1036 JDW Performed at [facility], [place] [address], [place], [place] [address]    Culture ESCHERICHIA COLI (A)  Final   Report Status PENDING  Incomplete  Blood Culture ID Panel (Reflexed)     Status: Abnormal   Collection Time: 03/14/18  7:02 AM  Result Value Ref Range Status   Enterococcus species NOT DETECTED NOT DETECTED Final   Listeria monocytogenes NOT DETECTED NOT DETECTED Final   Staphylococcus species NOT DETECTED NOT DETECTED Final   Staphylococcus aureus NOT DETECTED NOT DETECTED Final   Streptococcus species NOT DETECTED NOT DETECTED Final   Streptococcus agalactiae NOT DETECTED NOT DETECTED Final   Streptococcus pneumoniae NOT DETECTED NOT DETECTED Final   Streptococcus pyogenes NOT DETECTED NOT DETECTED Final   Acinetobacter baumannii NOT DETECTED NOT DETECTED Final   Enterobacteriaceae species DETECTED (A) NOT DETECTED Final    Comment: Enterobacteriaceae represent a large family of gram-negative bacteria, not a single organism. CRITICAL RESULT CALLED TO, READ BACK BY AND VERIFIED WITH: G BREEN PHARMD 03/14/18 1036 JDW    Enterobacter cloacae complex NOT DETECTED NOT DETECTED Final   Escherichia coli DETECTED (A) NOT DETECTED Final    Comment: CRITICAL RESULT CALLED TO, READ BACK BY AND VERIFIED WITH: B GREEN PHARMD 03/14/18 1036 JDW    Klebsiella oxytoca NOT DETECTED NOT DETECTED Final   Klebsiella pneumoniae NOT DETECTED NOT DETECTED Final   Proteus species NOT DETECTED NOT DETECTED Final   Serratia marcescens NOT DETECTED NOT DETECTED Final   Carbapenem resistance NOT DETECTED NOT DETECTED Final   Haemophilus influenzae NOT DETECTED NOT DETECTED Final   Neisseria meningitidis  NOT DETECTED NOT DETECTED Final   Pseudomonas aeruginosa NOT DETECTED NOT DETECTED Final   Candida albicans NOT DETECTED NOT DETECTED Final   Candida glabrata NOT DETECTED NOT DETECTED Final   Candida krusei NOT DETECTED NOT DETECTED Final   Candida parapsilosis NOT DETECTED NOT DETECTED Final   Candida tropicalis NOT DETECTED NOT DETECTED Final    Comment: Performed at [facility], [place] [address] [place], [place] [address]    Studies/Results: Dg C-arm 1-60 Min-no Report  Result Date: 03/14/2018 Fluoroscopy was utilized by the requesting physician.  No radiographic interpretation.    Assessment: Right ureteral calculus Bacteremia  secondary to urinary tract infection  Procedure(s): CYSTOSCOPY WITH RETROGRADE PYELOGRAM/URETERAL RIGHT STENT PLACEMENT, 1 Day Post-Op  doing well.  Plan: Agree with broad-spectrum antibiotics.  Given the positive blood culture, recommend remaining in the hospital until final results return.  At that point he can be transitioned hopefully to a p.o. antibiotic and discharged.  He will need to be set up for definitive management of the stone in several weeks.   Link Snuffer, MD Urology 03/15/2018, 8:31 AM

## 2018-03-15 NOTE — Progress Notes (Signed)
PROGRESS NOTE  Eddie Walker KXF:[id number] DOB: 1946/07/01 DOA: 03/14/2018 PCP: Shon Baton, MD  HPI/Recap of past 24 hours:  No pain, no fever, feeling better  Assessment/Plan: Active Problems:   Heartburn   BPH (benign prostatic hyperplasia)   Renal stone   Pyelonephritis   AKI (acute kidney injury) ([place])   Sepsis ([place])  Sepsis presented on presentation with sinus tachycardia heart rate 122, no cytosis WBC 24.7, lactic acidosis lactic acid 3.1, acute renal failure -Source of infection UTI/ bacteremia -Urine culture blood culture positive for E. Coli, continue Rocephin 2 g daily  Right ureteral stone, pyelonephritis, aki: -S/p CYSTOSCOPY WITH RETROGRADE PYELOGRAM/URETERAL RIGHT STENT PLACEMENT -Hold lisinopril, renal dosing meds -Urology input appreciated -We will need definitive management of stone in the next few weeks  BPH, continue Flomax and Avodart.  Impaired fasting glucose,  No prior history of diabetes, check A1c  Hypertension Home meds lisinopril held Likely able to resume once renal function improves  Code Status: full  Family Communication: patient   Disposition Plan: home in 1-2 days   Consultants:  urology  Procedures: CYSTOSCOPY WITH RETROGRADE PYELOGRAM/URETERAL RIGHT STENT PLACEMENT on 3/19   Antibiotics:  rocephin   Objective: BP 120/77 (BP Location: Left Arm)   Pulse 79   Temp 98.3 F (36.8 C) (Oral)   Resp 16   Ht 6\' 1"  (1.854 m)   Wt 88.9 kg (196 lb)   SpO2 94%   BMI 25.86 kg/m   Intake/Output Summary (Last 24 hours) at 03/15/2018 0918 Last data filed at 03/15/2018 0600 Gross per 24 hour  Intake 4524.17 ml  Output 850 ml  Net 3674.17 ml   Filed Weights   03/14/18 0637  Weight: 88.9 kg (196 lb)    Exam: Patient is examined daily including today on 03/15/2018, exams remain the same as of yesterday except that has changed    General:  NAD  Cardiovascular: RRR  Respiratory: CTABL  Abdomen: Soft/ND/NT,  positive BS  Musculoskeletal: No Edema  Neuro: alert, oriented   Data Reviewed: Basic Metabolic Panel: Recent Labs  Lab 03/14/18 0651 03/15/18 0426  NA 137 141  K 3.7 4.1  CL 105 110  CO2 21* 23  GLUCOSE 201* 196*  BUN 24* 25*  CREATININE 1.40* 1.20  CALCIUM 9.1 8.5*   Liver Function Tests: No results for input(s): AST, ALT, ALKPHOS, BILITOT, PROT, ALBUMIN in the last 168 hours. No results for input(s): LIPASE, AMYLASE in the last 168 hours. No results for input(s): AMMONIA in the last 168 hours. CBC: Recent Labs  Lab 03/14/18 0651 03/15/18 0426  WBC 24.7* 21.1*  NEUTROABS 23.7*  --   HGB 13.7 11.8*  HCT 42.3 37.7*  MCV 92.6 95.4  PLT 236 231   Cardiac Enzymes:   No results for input(s): CKTOTAL, CKMB, CKMBINDEX, TROPONINI in the last 168 hours. BNP (last 3 results) No results for input(s): BNP in the last 8760 hours.  ProBNP (last 3 results) No results for input(s): PROBNP in the last 8760 hours.  CBG: No results for input(s): GLUCAP in the last 168 hours.  Recent Results (from the past 240 hour(s))  Culture, blood (routine x 2)     Status: None (Preliminary result)   Collection Time: 03/14/18  6:51 AM  Result Value Ref Range Status   Specimen Description   Final    BLOOD LEFT ANTECUBITAL Performed at [facility], [address], [place], [place] [address]    Special Requests   Final  BOTTLES DRAWN AEROBIC AND ANAEROBIC Blood Culture adequate volume Performed at [facility], [address] [place], [place] [address]    Culture  Setup Time   Final    GRAM NEGATIVE RODS IN BOTH AEROBIC AND ANAEROBIC BOTTLES CRITICAL VALUE NOTED.  VALUE IS CONSISTENT WITH PREVIOUSLY REPORTED AND CALLED VALUE. Performed at [facility], [place] [address], [place], [place] [address]    Culture GRAM NEGATIVE RODS  Final   Report Status PENDING  Incomplete  Culture, blood (routine x 2)     Status: Abnormal (Preliminary result)   Collection  Time: 03/14/18  7:02 AM  Result Value Ref Range Status   Specimen Description   Final    BLOOD RIGHT HAND Performed at [facility], [address] [place], [place] [address]    Special Requests   Final    BOTTLES DRAWN AEROBIC AND ANAEROBIC Blood Culture results may not be optimal due to an inadequate volume of blood received in culture bottles Performed at [facility], [address] [place], [place] [address]    Culture  Setup Time   Final    GRAM NEGATIVE RODS IN BOTH AEROBIC AND ANAEROBIC BOTTLES CRITICAL RESULT CALLED TO, READ BACK BY AND VERIFIED WITH: B GREEN PHARMD 03/14/18 1036 JDW Performed at [facility], [place] [address] [place], [place] [address]    Culture ESCHERICHIA COLI (A)  Final   Report Status PENDING  Incomplete  Blood Culture ID Panel (Reflexed)     Status: Abnormal   Collection Time: 03/14/18  7:02 AM  Result Value Ref Range Status   Enterococcus species NOT DETECTED NOT DETECTED Final   Listeria monocytogenes NOT DETECTED NOT DETECTED Final   Staphylococcus species NOT DETECTED NOT DETECTED Final   Staphylococcus aureus NOT DETECTED NOT DETECTED Final   Streptococcus species NOT DETECTED NOT DETECTED Final   Streptococcus agalactiae NOT DETECTED NOT DETECTED Final   Streptococcus pneumoniae NOT DETECTED NOT DETECTED Final   Streptococcus pyogenes NOT DETECTED NOT DETECTED Final   Acinetobacter baumannii NOT DETECTED NOT DETECTED Final   Enterobacteriaceae species DETECTED (A) NOT DETECTED Final    Comment: Enterobacteriaceae represent a large family of gram-negative bacteria, not a single organism. CRITICAL RESULT CALLED TO, READ BACK BY AND VERIFIED WITH: G BREEN PHARMD 03/14/18 1036 JDW    Enterobacter cloacae complex NOT DETECTED NOT DETECTED Final   Escherichia coli DETECTED (A) NOT DETECTED Final    Comment: CRITICAL RESULT CALLED TO, READ BACK BY AND VERIFIED WITH: B GREEN PHARMD 03/14/18 1036 JDW    Klebsiella oxytoca  NOT DETECTED NOT DETECTED Final   Klebsiella pneumoniae NOT DETECTED NOT DETECTED Final   Proteus species NOT DETECTED NOT DETECTED Final   Serratia marcescens NOT DETECTED NOT DETECTED Final   Carbapenem resistance NOT DETECTED NOT DETECTED Final   Haemophilus influenzae NOT DETECTED NOT DETECTED Final   Neisseria meningitidis NOT DETECTED NOT DETECTED Final   Pseudomonas aeruginosa NOT DETECTED NOT DETECTED Final   Candida albicans NOT DETECTED NOT DETECTED Final   Candida glabrata NOT DETECTED NOT DETECTED Final   Candida krusei NOT DETECTED NOT DETECTED Final   Candida parapsilosis NOT DETECTED NOT DETECTED Final   Candida tropicalis NOT DETECTED NOT DETECTED Final    Comment: Performed at [facility], [place] [address] [place], [place] [address]     Studies: Dg C-arm 1-60 Min-no Report  Result Date: 03/14/2018 Fluoroscopy was utilized by  the requesting physician.  No radiographic interpretation.    Scheduled Meds: . dutasteride  0.5 mg Oral Daily  . enoxaparin (LOVENOX) injection  40 mg Subcutaneous Q24H  . finasteride  5 mg Oral Daily  . pantoprazole  40 mg Oral Daily    Continuous Infusions: . sodium chloride 125 mL/hr at 03/15/18 0622  . cefTRIAXone (ROCEPHIN)  IV Stopped (03/14/18 2334)     Time spent: 36mins I have personally reviewed and interpreted on  03/15/2018 daily labs, tele strips, imagings as discussed above under date review session and assessment and plans.  I reviewed all nursing notes, pharmacy notes, consultant notes,  vitals, pertinent old records  I have discussed plan of care as described above with RN , patient on 03/15/2018   Florencia Reasons MD, PhD  [facility] Pager [phone]. If 7PM-7AM, please contact night-coverage at [email or web address], password [facility] 03/15/2018, 9:18 AM  LOS: 1 day

## 2018-03-15 NOTE — Anesthesia Postprocedure Evaluation (Signed)
Anesthesia Post Note  Patient: Eddie Walker  Procedure(s) Performed: CYSTOSCOPY WITH RETROGRADE PYELOGRAM/URETERAL RIGHT STENT PLACEMENT (Right Ureter)     Patient location during evaluation: PACU Anesthesia Type: General Level of consciousness: awake and alert Pain management: pain level controlled Vital Signs Assessment: post-procedure vital signs reviewed and stable Respiratory status: spontaneous breathing, nonlabored ventilation, respiratory function stable and patient connected to nasal cannula oxygen Cardiovascular status: blood pressure returned to baseline and stable Postop Assessment: no apparent nausea or vomiting Anesthetic complications: no    Last Vitals:  Vitals:   03/14/18 1336 03/14/18 2029  BP: 135/82 132/81  Pulse: 91 (!) 112  Resp: 15 16  Temp: 37.1 C 37.1 C  SpO2: 95% 95%    Last Pain:  Vitals:   03/14/18 2029  TempSrc: Oral  PainSc:                  Barnet Glasgow

## 2018-03-15 NOTE — Plan of Care (Signed)
  Progressing Education: Knowledge of General Education information will improve 03/15/2018 1617 - Progressing by [name], [name], RN Health Behavior/Discharge Planning: Ability to manage health-related needs will improve 03/15/2018 1617 - Progressing by [name], [name], RN Clinical Measurements: Ability to maintain clinical measurements within normal limits will improve 03/15/2018 1617 - Progressing by [name], [name], RN Diagnostic test results will improve 03/15/2018 1617 - Progressing by [name], [name], RN Respiratory complications will improve 03/15/2018 1617 - Progressing by [name], [name], RN Cardiovascular complication will be avoided 03/15/2018 1617 - Progressing by [name], RN Coping: Level of anxiety will decrease 03/15/2018 1617 - Progressing by [name], RN Elimination: Will not experience complications related to bowel motility 03/15/2018 1617 - Progressing by [name], RN Will not experience complications related to urinary retention 03/15/2018 1617 - Progressing by [name], [name], RN Safety: Ability to remain free from injury will improve 03/15/2018 1617 - Progressing by [name], [name], RN

## 2018-03-16 LAB — CBC
HEMATOCRIT: 39 % (ref 39.0–52.0)
Hemoglobin: 12.8 g/dL — ABNORMAL LOW (ref 13.0–17.0)
MCH: 30.7 pg (ref 26.0–34.0)
MCHC: 32.8 g/dL (ref 30.0–36.0)
MCV: 93.5 fL (ref 78.0–100.0)
PLATELETS: 230 10*3/uL (ref 150–400)
RBC: 4.17 MIL/uL — ABNORMAL LOW (ref 4.22–5.81)
RDW: 14.6 % (ref 11.5–15.5)
WBC: 11.2 10*3/uL — AB (ref 4.0–10.5)

## 2018-03-16 LAB — CULTURE, BLOOD (ROUTINE X 2): SPECIAL REQUESTS: ADEQUATE

## 2018-03-16 LAB — URINE CULTURE: Culture: >=100000 — AB

## 2018-03-16 LAB — BASIC METABOLIC PANEL
ANION GAP: 11 (ref 5–15)
BUN: 19 mg/dL (ref 6–20)
CO2: 23 mmol/L (ref 22–32)
Calcium: 9 mg/dL (ref 8.9–10.3)
Chloride: 108 mmol/L (ref 101–111)
Creatinine, Ser: 1.08 mg/dL (ref 0.61–1.24)
GFR calc Af Amer: >60 mL/min (ref >60)
GLUCOSE: 186 mg/dL — AB (ref 65–99)
Potassium: 3.3 mmol/L — ABNORMAL LOW (ref 3.5–5.1)
Sodium: 142 mmol/L (ref 135–145)

## 2018-03-16 LAB — HEMOGLOBIN A1C
Hgb A1c MFr Bld: 6.7 % — ABNORMAL HIGH (ref 4.8–5.6)
MEAN PLASMA GLUCOSE: 145.59 mg/dL

## 2018-03-16 MED ORDER — AMOXICILLIN-POT CLAVULANATE 875-125 MG PO TABS
1.0000 | ORAL_TABLET | Freq: Two times a day (BID) | ORAL | Status: DC
  Administered 2018-03-16: 1 via ORAL
  Filled 2018-03-16: qty 1

## 2018-03-16 MED ORDER — AMOXICILLIN-POT CLAVULANATE 875-125 MG PO TABS: 1.0000 | ORAL_TABLET | Freq: Two times a day (BID) | ORAL | 0 refills | Status: AC

## 2018-03-16 MED ORDER — SACCHAROMYCES BOULARDII 250 MG PO CAPS: 250.0000 mg | ORAL_CAPSULE | Freq: Two times a day (BID) | ORAL | 0 refills | Status: AC

## 2018-03-16 MED ORDER — POTASSIUM CHLORIDE CRYS ER 20 MEQ PO TBCR
40.0000 meq | EXTENDED_RELEASE_TABLET | Freq: Once | ORAL | Status: AC
  Administered 2018-03-16: 40 meq via ORAL
  Filled 2018-03-16: qty 2

## 2018-03-16 MED ORDER — SACCHAROMYCES BOULARDII 250 MG PO CAPS
250.0000 mg | ORAL_CAPSULE | Freq: Two times a day (BID) | ORAL | Status: DC
  Administered 2018-03-16: 250 mg via ORAL
  Filled 2018-03-16: qty 1

## 2018-03-16 NOTE — Discharge Summary (Signed)
Discharge Summary  Eddie Walker BTD:[id number] DOB: 09/15/1946  PCP: Shon Baton, MD  Admit date: 03/14/2018 Discharge date: 03/16/2018  Time spent: <52mins  Recommendations for Outpatient Follow-up:  1. F/u with PMD within a week  for hospital discharge follow up, repeat cbc/bmp at follow up. pmd to monitor blood glucose control 2. F/u with urology   Discharge Diagnoses:  Active Hospital Problems   Diagnosis Date Noted  . AKI (acute kidney injury) ([place]) 04/06/2016  . Sepsis ([place])   . Pyelonephritis 04/05/2016  . Renal stone 04/17/2014  . BPH (benign prostatic hyperplasia) 03/28/2013  . Heartburn 03/08/2011    Resolved Hospital Problems  No resolved problems to display.    Discharge Condition: stable  Diet recommendation: heart healthy/carb modified  Filed Weights   03/14/18 0637  Weight: 88.9 kg (196 lb)    History of present illness:  PCP: Shon Baton, MD  Patient coming from: Home.   I have personally briefly reviewed patient's old medical records in [place]  Chief Complaint:  Right flank pain, nausea, vomiting and fever chills since 2 days.   HPI: Eddie Walker is a 72 y.o. male with medical history significant of renal stones, GERD, comes in for persistent rigth flank pain, associated with nausea, vomiting, fever and chills. Pt sees Dr Dorina Hoyer as outpatient. Pt presented to [facility] with these complaints and was transferred to [facility]. He denies any abdominal pain, headches, dizziness, sob, chest pain or cough. He was referred to medical service for pyelonephritis. ED work up revealed wbc count of 24.7 and creatinine of 1.4, lactic acid of 1.92. Urology consulted by EDP.     Hospital Course:  Active Problems:   Heartburn   BPH (benign prostatic hyperplasia)   Renal stone   Pyelonephritis   AKI (acute kidney injury) ([place])   Sepsis ([place])   Sepsis presented on presentation with sinus tachycardia heart rate 122, no cytosis WBC 24.7, lactic  acidosis lactic acid 3.1, acute renal failure -Source of infection UTI/ bacteremia -Urine culture, blood culture positive for E. Coli which is intermediate for nitrofurantoin and resistant to ampicillin but sensitive to ampicilin/sulbactam, he received Rocephin 2 g daily in the hospital , he is discharged on augmentin for another 11 days to finish total of 14days treatment.   Right ureteral stone, pyelonephritis, aki: -S/p CYSTOSCOPY WITH RETROGRADE PYELOGRAM/URETERAL RIGHT STENT PLACEMENT --Urology input appreciated -We will need definitive management of stone in the next few weeks -aki resolved at discharge  BPH, continue Flomax and Avodart.  Hypokalemia: replace k  Impaired fasting glucose,  No prior history of diabetes,  A1c 6.7 Diet modification, pmd follow up.  Hypertension Home meds lisinopril held in the hospital due to sepsis and aki Sepsis  And aki resolved,  resume lisinopril at discharge  Code Status: full  Family Communication: patient   Disposition Plan: home    Consultants:  urology  Procedures: CYSTOSCOPY WITH RETROGRADE PYELOGRAM/URETERAL RIGHT STENT PLACEMENT on 3/19   Antibiotics:  rocephin    Discharge Exam: BP (!) 129/99 (BP Location: Left Arm)   Pulse 63   Temp 98 F (36.7 C) (Oral)   Resp 18   Ht 6\' 1"  (1.854 m)   Wt 88.9 kg (196 lb)   SpO2 96%   BMI 25.86 kg/m     General:  NAD  Cardiovascular: RRR  Respiratory: CTABL  Abdomen: Soft/ND/NT, positive BS  Musculoskeletal: No Edema  Neuro: alert, oriented    Discharge Instructions You were cared for by a hospitalist  during your hospital stay. If you have any questions about your discharge medications or the care you received while you were in the hospital after you are discharged, you can call the unit and asked to speak with the hospitalist on call if the hospitalist that took care of you is not available. Once you are discharged, your primary care physician will  handle any further medical issues. Please note that NO REFILLS for any discharge medications will be authorized once you are discharged, as it is imperative that you return to your primary care physician (or establish a relationship with a primary care physician if you do not have one) for your aftercare needs so that they can reassess your need for medications and monitor your lab values.  Discharge Instructions    Diet - low sodium heart healthy   Complete by:  As directed    Carb modified diet.   Increase activity slowly   Complete by:  As directed      Allergies as of 03/16/2018      Reactions   Peanut-containing Drug Products Anaphylaxis, Other (See Comments)   Patient has Epipen to treat this- anaphylaxis shock   Novocain [procaine Hcl] Itching, Rash, Other (See Comments)   Low grade fever      Medication List    TAKE these medications   acetaminophen 500 MG tablet Commonly known as:  TYLENOL Take 1,000 mg by mouth 2 (two) times daily as needed for mild pain or fever.   amoxicillin-clavulanate 875-125 MG tablet Commonly known as:  AUGMENTIN Take 1 tablet by mouth every 12 (twelve) hours for 11 days.   aspirin EC 81 MG tablet Take 81 mg by mouth daily.   dutasteride 0.5 MG capsule Commonly known as:  AVODART Take 0.5 mg by mouth daily.   EPIPEN 0.3 mg/0.3 mL Soaj injection Generic drug:  EPINEPHrine Inject 0.3 mg into the muscle once as needed (For anaphylaxis.).   glucosamine-chondroitin 500-400 MG tablet Take 1 tablet by mouth daily.   lisinopril 10 MG tablet Commonly known as:  PRINIVIL,ZESTRIL Take 10 mg by mouth daily.   multivitamin with minerals Tabs tablet Take 1 tablet by mouth daily.   omeprazole 40 MG capsule Commonly known as:  PRILOSEC Take 40 mg by mouth daily.   saccharomyces boulardii 250 MG capsule Commonly known as:  FLORASTOR Take 1 capsule (250 mg total) by mouth 2 (two) times daily.   tamsulosin 0.4 MG Caps capsule Commonly known as:   FLOMAX Take 0.4 mg by mouth daily.   VIAGRA 100 MG tablet Generic drug:  sildenafil Take 100 mg by mouth daily as needed for erectile dysfunction.      Allergies  Allergen Reactions  . Peanut-Containing Drug Products Anaphylaxis and Other (See Comments)    Patient has Epipen to treat this- anaphylaxis shock  . Novocain [Procaine Hcl] Itching, Rash and Other (See Comments)    Low grade fever   Follow-up Information    Shon Baton, MD Follow up in 1 week(s).   Specialty:  Internal Medicine Why:  repeat cbc/bmp at follow up pmd to monitor blood sugar control Contact information: [place] [address] [phone]        Lucas Mallow, MD Follow up.   Specialty:  Urology Contact information: [place] [address] [phone]            The results of significant diagnostics from this hospitalization (including imaging, microbiology, ancillary and laboratory) are listed  below for reference.    Significant Diagnostic Studies: Dg C-arm 1-60 Min-no Report  Result Date: 03/14/2018 Fluoroscopy was utilized by the requesting physician.  No radiographic interpretation.    Microbiology: Recent Results (from the past 240 hour(s))  Culture, blood (routine x 2)     Status: Abnormal   Collection Time: 03/14/18  6:51 AM  Result Value Ref Range Status   Specimen Description   Final    BLOOD LEFT ANTECUBITAL Performed at [facility], [address] [place], [place] [address]    Special Requests   Final    BOTTLES DRAWN AEROBIC AND ANAEROBIC Blood Culture adequate volume Performed at [facility], [address] [place], [place] [address]    Culture  Setup Time   Final    GRAM NEGATIVE RODS IN BOTH AEROBIC AND ANAEROBIC BOTTLES CRITICAL VALUE NOTED.  VALUE IS CONSISTENT WITH PREVIOUSLY REPORTED AND CALLED VALUE.    Culture (A)  Final    ESCHERICHIA COLI SUSCEPTIBILITIES PERFORMED ON PREVIOUS CULTURE  WITHIN THE LAST 5 DAYS. Performed at [facility], [place] [address], [place], [place] [address]    Report Status 03/16/2018 FINAL  Final  Urine culture     Status: Abnormal   Collection Time: 03/14/18  6:51 AM  Result Value Ref Range Status   Specimen Description   Final    URINE, CLEAN CATCH Performed at [facility], [address] [place], [place] [address]    Special Requests   Final    NONE Performed at [facility], [address] [place], [place] [address]    Culture >=100,000 COLONIES/mL ESCHERICHIA COLI (A)  Final   Report Status 03/16/2018 FINAL  Final   Organism ID, Bacteria ESCHERICHIA COLI (A)  Final      Susceptibility   Escherichia coli - MIC*    AMPICILLIN >=32 RESISTANT Resistant     CEFAZOLIN <=4 SENSITIVE Sensitive     CEFTRIAXONE <=1 SENSITIVE Sensitive     CIPROFLOXACIN <=0.25 SENSITIVE Sensitive     GENTAMICIN <=1 SENSITIVE Sensitive     IMIPENEM <=0.25 SENSITIVE Sensitive     NITROFURANTOIN 64 INTERMEDIATE Intermediate     TRIMETH/SULFA <=20 SENSITIVE Sensitive     AMPICILLIN/SULBACTAM 8 SENSITIVE Sensitive     PIP/TAZO <=4 SENSITIVE Sensitive     Extended ESBL NEGATIVE Sensitive     * >=100,000 COLONIES/mL ESCHERICHIA COLI  Culture, blood (routine x 2)     Status: Abnormal   Collection Time: 03/14/18  7:02 AM  Result Value Ref Range Status   Specimen Description   Final    BLOOD RIGHT HAND Performed at [facility], [address] [place], [place] [address]    Special Requests   Final    BOTTLES DRAWN AEROBIC AND ANAEROBIC Blood Culture results may not be optimal due to an inadequate volume of blood received in culture bottles Performed at [facility], [address] [place], [place] [address]    Culture  Setup Time   Final    GRAM NEGATIVE RODS IN BOTH AEROBIC AND ANAEROBIC BOTTLES CRITICAL RESULT CALLED TO, READ BACK BY AND VERIFIED WITH: B GREEN PHARMD 03/14/18 1036 JDW Performed at [facility], [place] [address] [place], [place] [address]    Culture ESCHERICHIA COLI (A)  Final   Report Status 03/16/2018 FINAL  Final   Organism ID, Bacteria ESCHERICHIA COLI  Final  Susceptibility   Escherichia coli - MIC*    AMPICILLIN >=32 RESISTANT Resistant     CEFAZOLIN <=4 SENSITIVE Sensitive     CEFEPIME <=1 SENSITIVE Sensitive     CEFTAZIDIME <=1 SENSITIVE Sensitive     CEFTRIAXONE <=1 SENSITIVE Sensitive     CIPROFLOXACIN <=0.25 SENSITIVE Sensitive     GENTAMICIN <=1 SENSITIVE Sensitive     IMIPENEM <=0.25 SENSITIVE Sensitive     TRIMETH/SULFA <=20 SENSITIVE Sensitive     AMPICILLIN/SULBACTAM 8 SENSITIVE Sensitive     PIP/TAZO <=4 SENSITIVE Sensitive     Extended ESBL NEGATIVE Sensitive     * ESCHERICHIA COLI  Blood Culture ID Panel (Reflexed)     Status: Abnormal   Collection Time: 03/14/18  7:02 AM  Result Value Ref Range Status   Enterococcus species NOT DETECTED NOT DETECTED Final   Listeria monocytogenes NOT DETECTED NOT DETECTED Final   Staphylococcus species NOT DETECTED NOT DETECTED Final   Staphylococcus aureus NOT DETECTED NOT DETECTED Final   Streptococcus species NOT DETECTED NOT DETECTED Final   Streptococcus agalactiae NOT DETECTED NOT DETECTED Final   Streptococcus pneumoniae NOT DETECTED NOT DETECTED Final   Streptococcus pyogenes NOT DETECTED NOT DETECTED Final   Acinetobacter baumannii NOT DETECTED NOT DETECTED Final   Enterobacteriaceae species DETECTED (A) NOT DETECTED Final    Comment: Enterobacteriaceae represent a large family of gram-negative bacteria, not a single organism. CRITICAL RESULT CALLED TO, READ BACK BY AND VERIFIED WITH: G BREEN PHARMD 03/14/18 1036 JDW    Enterobacter cloacae complex NOT DETECTED NOT DETECTED Final   Escherichia coli DETECTED (A) NOT DETECTED Final    Comment: CRITICAL RESULT CALLED TO, READ BACK BY AND VERIFIED WITH: B GREEN PHARMD 03/14/18 1036 JDW    Klebsiella oxytoca NOT DETECTED NOT DETECTED Final    Klebsiella pneumoniae NOT DETECTED NOT DETECTED Final   Proteus species NOT DETECTED NOT DETECTED Final   Serratia marcescens NOT DETECTED NOT DETECTED Final   Carbapenem resistance NOT DETECTED NOT DETECTED Final   Haemophilus influenzae NOT DETECTED NOT DETECTED Final   Neisseria meningitidis NOT DETECTED NOT DETECTED Final   Pseudomonas aeruginosa NOT DETECTED NOT DETECTED Final   Candida albicans NOT DETECTED NOT DETECTED Final   Candida glabrata NOT DETECTED NOT DETECTED Final   Candida krusei NOT DETECTED NOT DETECTED Final   Candida parapsilosis NOT DETECTED NOT DETECTED Final   Candida tropicalis NOT DETECTED NOT DETECTED Final    Comment: Performed at [facility], [place] [address] [place], [place] [address]     Labs: Basic Metabolic Panel: Recent Labs  Lab 03/14/18 0651 03/15/18 0426 03/16/18 0911  NA 137 141 142  K 3.7 4.1 3.3*  CL 105 110 108  CO2 21* 23 23  GLUCOSE 201* 196* 186*  BUN 24* 25* 19  CREATININE 1.40* 1.20 1.08  CALCIUM 9.1 8.5* 9.0   Liver Function Tests: No results for input(s): AST, ALT, ALKPHOS, BILITOT, PROT, ALBUMIN in the last 168 hours. No results for input(s): LIPASE, AMYLASE in the last 168 hours. No results for input(s): AMMONIA in the last 168 hours. CBC: Recent Labs  Lab 03/14/18 0651 03/15/18 0426 03/16/18 0911  WBC 24.7* 21.1* 11.2*  NEUTROABS 23.7*  --   --   HGB 13.7 11.8* 12.8*  HCT 42.3 37.7* 39.0  MCV 92.6 95.4 93.5  PLT 236 231 230   Cardiac Enzymes: No results for input(s): CKTOTAL, CKMB, CKMBINDEX, TROPONINI in the last 168 hours. BNP: BNP (last 3 results) No  results for input(s): BNP in the last 8760 hours.  ProBNP (last 3 results) No results for input(s): PROBNP in the last 8760 hours.  CBG: No results for input(s): GLUCAP in the last 168 hours.     Signed:  Florencia Reasons MD, PhD  [facility] 03/16/2018, 12:23 PM

## 2018-03-16 NOTE — Progress Notes (Signed)
Patient and family given discharge, follow up, and medication instructions, verbalized understanding, IV removed, personal belongings with patient, family to transport home  

## 2018-03-20 LAB — CULTURE, BLOOD (ROUTINE X 2)
Culture: NO GROWTH
Culture: NO GROWTH
SPECIAL REQUESTS: ADEQUATE
Special Requests: ADEQUATE

## 2018-03-23 DIAGNOSIS — I10 Essential (primary) hypertension: Secondary | ICD-10-CM | POA: Diagnosis not present

## 2018-03-23 DIAGNOSIS — R7309 Other abnormal glucose: Secondary | ICD-10-CM | POA: Diagnosis not present

## 2018-03-23 MED FILL — SILDENAFIL CITRATE 100 MG T: 100 | 5 days supply | Qty: 5 | Fill #0

## 2018-03-29 DIAGNOSIS — R3915 Urgency of urination: Secondary | ICD-10-CM | POA: Diagnosis not present

## 2018-03-29 DIAGNOSIS — N201 Calculus of ureter: Secondary | ICD-10-CM | POA: Diagnosis not present

## 2018-03-29 DIAGNOSIS — R350 Frequency of micturition: Secondary | ICD-10-CM | POA: Diagnosis not present

## 2018-04-01 DIAGNOSIS — R39198 Other difficulties with micturition: Secondary | ICD-10-CM | POA: Diagnosis not present

## 2018-04-01 DIAGNOSIS — N12 Tubulo-interstitial nephritis, not specified as acute or chronic: Secondary | ICD-10-CM | POA: Diagnosis not present

## 2018-04-01 DIAGNOSIS — N200 Calculus of kidney: Secondary | ICD-10-CM | POA: Diagnosis not present

## 2018-04-01 DIAGNOSIS — Z79899 Other long term (current) drug therapy: Secondary | ICD-10-CM | POA: Diagnosis not present

## 2018-04-01 DIAGNOSIS — Z960 Presence of urogenital implants: Secondary | ICD-10-CM | POA: Diagnosis not present

## 2018-04-01 DIAGNOSIS — N401 Enlarged prostate with lower urinary tract symptoms: Secondary | ICD-10-CM | POA: Diagnosis not present

## 2018-04-03 DIAGNOSIS — N200 Calculus of kidney: Secondary | ICD-10-CM | POA: Diagnosis not present

## 2018-04-11 DIAGNOSIS — N202 Calculus of kidney with calculus of ureter: Secondary | ICD-10-CM | POA: Diagnosis not present

## 2018-04-15 DIAGNOSIS — E876 Hypokalemia: Secondary | ICD-10-CM | POA: Diagnosis not present

## 2018-04-15 DIAGNOSIS — R7309 Other abnormal glucose: Secondary | ICD-10-CM | POA: Diagnosis not present

## 2018-04-15 DIAGNOSIS — N390 Urinary tract infection, site not specified: Secondary | ICD-10-CM | POA: Diagnosis not present

## 2018-04-15 DIAGNOSIS — N401 Enlarged prostate with lower urinary tract symptoms: Secondary | ICD-10-CM | POA: Diagnosis not present

## 2018-04-15 DIAGNOSIS — Z6828 Body mass index (BMI) 28.0-28.9, adult: Secondary | ICD-10-CM | POA: Diagnosis not present

## 2018-04-15 DIAGNOSIS — Z960 Presence of urogenital implants: Secondary | ICD-10-CM | POA: Diagnosis not present

## 2018-04-15 DIAGNOSIS — N12 Tubulo-interstitial nephritis, not specified as acute or chronic: Secondary | ICD-10-CM | POA: Diagnosis not present

## 2018-04-15 DIAGNOSIS — I10 Essential (primary) hypertension: Secondary | ICD-10-CM | POA: Diagnosis not present

## 2018-04-15 DIAGNOSIS — N200 Calculus of kidney: Secondary | ICD-10-CM | POA: Diagnosis not present

## 2018-05-04 DIAGNOSIS — B9629 Other Escherichia coli [E. coli] as the cause of diseases classified elsewhere: Secondary | ICD-10-CM | POA: Diagnosis not present

## 2018-05-04 DIAGNOSIS — R300 Dysuria: Secondary | ICD-10-CM | POA: Diagnosis not present

## 2018-05-04 DIAGNOSIS — N390 Urinary tract infection, site not specified: Secondary | ICD-10-CM | POA: Diagnosis not present

## 2018-05-24 DIAGNOSIS — N202 Calculus of kidney with calculus of ureter: Secondary | ICD-10-CM | POA: Diagnosis not present

## 2018-08-02 DIAGNOSIS — N5203 Combined arterial insufficiency and corporo-venous occlusive erectile dysfunction: Secondary | ICD-10-CM | POA: Diagnosis not present

## 2018-08-02 DIAGNOSIS — N138 Other obstructive and reflux uropathy: Secondary | ICD-10-CM | POA: Diagnosis not present

## 2018-08-02 DIAGNOSIS — N200 Calculus of kidney: Secondary | ICD-10-CM | POA: Diagnosis not present

## 2018-08-02 DIAGNOSIS — N401 Enlarged prostate with lower urinary tract symptoms: Secondary | ICD-10-CM | POA: Diagnosis not present

## 2018-08-24 DIAGNOSIS — N5203 Combined arterial insufficiency and corporo-venous occlusive erectile dysfunction: Secondary | ICD-10-CM | POA: Diagnosis not present

## 2018-10-05 DIAGNOSIS — E119 Type 2 diabetes mellitus without complications: Secondary | ICD-10-CM | POA: Diagnosis not present

## 2018-10-05 DIAGNOSIS — I10 Essential (primary) hypertension: Secondary | ICD-10-CM | POA: Diagnosis not present

## 2018-10-05 DIAGNOSIS — L989 Disorder of the skin and subcutaneous tissue, unspecified: Secondary | ICD-10-CM | POA: Diagnosis not present

## 2018-10-05 DIAGNOSIS — M179 Osteoarthritis of knee, unspecified: Secondary | ICD-10-CM | POA: Diagnosis not present

## 2018-10-05 DIAGNOSIS — Z91018 Allergy to other foods: Secondary | ICD-10-CM | POA: Diagnosis not present

## 2018-10-05 DIAGNOSIS — H6121 Impacted cerumen, right ear: Secondary | ICD-10-CM | POA: Diagnosis not present

## 2018-10-05 DIAGNOSIS — N200 Calculus of kidney: Secondary | ICD-10-CM | POA: Diagnosis not present

## 2018-10-13 DIAGNOSIS — I781 Nevus, non-neoplastic: Secondary | ICD-10-CM | POA: Diagnosis not present

## 2018-10-13 DIAGNOSIS — D239 Other benign neoplasm of skin, unspecified: Secondary | ICD-10-CM | POA: Diagnosis not present

## 2018-10-13 DIAGNOSIS — L821 Other seborrheic keratosis: Secondary | ICD-10-CM | POA: Diagnosis not present

## 2018-10-13 DIAGNOSIS — D1800 Hemangioma unspecified site: Secondary | ICD-10-CM | POA: Diagnosis not present

## 2018-11-01 DIAGNOSIS — R799 Abnormal finding of blood chemistry, unspecified: Secondary | ICD-10-CM | POA: Diagnosis not present

## 2018-11-01 DIAGNOSIS — M8588 Other specified disorders of bone density and structure, other site: Secondary | ICD-10-CM | POA: Diagnosis not present

## 2018-11-01 DIAGNOSIS — M1611 Unilateral primary osteoarthritis, right hip: Secondary | ICD-10-CM | POA: Diagnosis not present

## 2018-11-01 DIAGNOSIS — M1711 Unilateral primary osteoarthritis, right knee: Secondary | ICD-10-CM | POA: Diagnosis not present

## 2018-11-01 DIAGNOSIS — M25461 Effusion, right knee: Secondary | ICD-10-CM | POA: Diagnosis not present

## 2018-11-01 DIAGNOSIS — M25861 Other specified joint disorders, right knee: Secondary | ICD-10-CM | POA: Diagnosis not present

## 2018-11-01 DIAGNOSIS — R933 Abnormal findings on diagnostic imaging of other parts of digestive tract: Secondary | ICD-10-CM | POA: Diagnosis not present

## 2018-11-01 DIAGNOSIS — M25561 Pain in right knee: Secondary | ICD-10-CM | POA: Diagnosis not present

## 2018-11-01 DIAGNOSIS — Z01818 Encounter for other preprocedural examination: Secondary | ICD-10-CM | POA: Diagnosis not present

## 2018-11-02 DIAGNOSIS — Z125 Encounter for screening for malignant neoplasm of prostate: Secondary | ICD-10-CM | POA: Diagnosis not present

## 2018-11-02 DIAGNOSIS — Z0000 Encounter for general adult medical examination without abnormal findings: Secondary | ICD-10-CM | POA: Diagnosis not present

## 2018-11-02 DIAGNOSIS — Z8042 Family history of malignant neoplasm of prostate: Secondary | ICD-10-CM | POA: Diagnosis not present

## 2018-11-02 DIAGNOSIS — K219 Gastro-esophageal reflux disease without esophagitis: Secondary | ICD-10-CM | POA: Diagnosis not present

## 2018-11-11 ENCOUNTER — Other Ambulatory Visit: Payer: Self-pay

## 2018-11-11 ENCOUNTER — Emergency Department (HOSPITAL_BASED_OUTPATIENT_CLINIC_OR_DEPARTMENT_OTHER)
Admission: EM | Admit: 2018-11-11 | Discharge: 2018-11-11 | Disposition: A | Discharge status: Home / Self Care | Payer: Medicare Other | Attending: Emergency Medicine | Admitting: Emergency Medicine

## 2018-11-11 ENCOUNTER — Encounter (HOSPITAL_BASED_OUTPATIENT_CLINIC_OR_DEPARTMENT_OTHER): Payer: Self-pay | Admitting: Emergency Medicine

## 2018-11-11 DIAGNOSIS — I129 Hypertensive chronic kidney disease with stage 1 through stage 4 chronic kidney disease, or unspecified chronic kidney disease: Secondary | ICD-10-CM | POA: Insufficient documentation

## 2018-11-11 DIAGNOSIS — N189 Chronic kidney disease, unspecified: Secondary | ICD-10-CM | POA: Diagnosis not present

## 2018-11-11 DIAGNOSIS — N3000 Acute cystitis without hematuria: Secondary | ICD-10-CM | POA: Diagnosis not present

## 2018-11-11 DIAGNOSIS — R300 Dysuria: Secondary | ICD-10-CM | POA: Diagnosis present

## 2018-11-11 DIAGNOSIS — Z87891 Personal history of nicotine dependence: Secondary | ICD-10-CM | POA: Insufficient documentation

## 2018-11-11 DIAGNOSIS — Z79899 Other long term (current) drug therapy: Secondary | ICD-10-CM | POA: Insufficient documentation

## 2018-11-11 LAB — URINALYSIS, ROUTINE W REFLEX MICROSCOPIC
BILIRUBIN URINE: NEGATIVE
Glucose, UA: NEGATIVE mg/dL
Ketones, ur: NEGATIVE mg/dL
NITRITE: POSITIVE — AB
PH: 6 (ref 5.0–8.0)
Protein, ur: NEGATIVE mg/dL
SPECIFIC GRAVITY, URINE: 1.025 (ref 1.005–1.030)

## 2018-11-11 LAB — URINALYSIS, MICROSCOPIC (REFLEX)

## 2018-11-11 MED ORDER — LIDOCAINE HCL 2 % IJ SOLN
20.0000 mL | Freq: Once | INTRAMUSCULAR | Status: AC
  Administered 2018-11-11: 400 mg
  Filled 2018-11-11: qty 20

## 2018-11-11 MED ORDER — AMOXICILLIN-POT CLAVULANATE 875-125 MG PO TABS: 1.0000 | ORAL_TABLET | Freq: Two times a day (BID) | ORAL | 0 refills | Status: AC

## 2018-11-11 MED ORDER — CEFTRIAXONE SODIUM 1 G IJ SOLR
1.0000 g | Freq: Once | INTRAMUSCULAR | Status: AC
  Administered 2018-11-11: 1 g via INTRAMUSCULAR
  Filled 2018-11-11: qty 10

## 2018-11-11 NOTE — ED Triage Notes (Addendum)
Reports urinary odor and dysuria for "a few days". States previous UTI 3 months ago was positive for E. Coli.

## 2018-11-11 NOTE — ED Provider Notes (Signed)
[place] EMERGENCY DEPARTMENT Provider Note   CSN: [id number] Arrival date & time: 11/11/18  0907     History   Chief Complaint Chief Complaint  Patient presents with  . Urinary Tract Infection    HPI Eddie Walker is a 72 y.o. male.  HPI Patient has been having burning with urination for about 1 week.  He has been having some frequency.  He had similar symptoms in the past with urinary tract infection.  He denies flank pain or fever.  No associated abdominal pain.  No nausea no vomiting.  He reports he did get fairly ill and had to be hospitalized 3 months ago and had an E. coli infection at that time. Past Medical History:  Diagnosis Date  . Arthritis   . GERD (gastroesophageal reflux disease)   . H/O hiatal hernia   . Hernia 01/2013  . Kidney stone 2008    Patient Active Problem List   Diagnosis Date Noted  . Acute respiratory failure ([place]) 04/13/2016  . AKI (acute kidney injury) ([place]) 04/06/2016  . Kidney stone 04/06/2016  . Elevated blood pressure 04/06/2016  . Ureteral stone with hydronephrosis   . Sepsis ([place])   . Urinary tract infectious disease   . Pyelonephritis 04/05/2016  . Hydronephrosis of right kidney 04/05/2016  . Renal stone 04/17/2014  . Kidney stone on left side 04/15/2014  . Acute renal failure ([place]) 03/28/2013  . Sepsis syndrome 03/28/2013  . UTI (urinary tract infection) 03/28/2013  . BPH (benign prostatic hyperplasia) 03/28/2013  . Heartburn 03/08/2011    Past Surgical History:  Procedure Laterality Date  . CYSTOSCOPY W/ URETERAL STENT PLACEMENT Right 03/14/2018   Procedure: CYSTOSCOPY WITH RETROGRADE PYELOGRAM/URETERAL RIGHT STENT PLACEMENT;  Surgeon: Lucas Mallow, MD;  Location: [facility];  Service: Urology;  Laterality: Right;  . CYSTOSCOPY WITH RETROGRADE PYELOGRAM, URETEROSCOPY AND STENT PLACEMENT Right 04/05/2016   Procedure: CYSTOSCOPY WITH RETROGRADE PYELOGRAM, URETEROSCOPY AND STENT PLACEMENT;  Surgeon: Franchot Gallo, MD;  Location: [facility];  Service: Urology;  Laterality: Right;  . CYSTOSCOPY WITH STENT PLACEMENT Left 04/16/2014   Procedure: CYSTOSCOPY WITH STENT PLACEMENT;  Surgeon: Irine Seal, MD;  Location: [facility];  Service: Urology;  Laterality: Left;  . CYSTOSCOPY WITH URETEROSCOPY AND STENT PLACEMENT Right 06/14/2016   Procedure: CYSTOSCOPY WITH RIGHT URETEROSCOPIC STENT EXTRACTION;  Surgeon: Franchot Gallo, MD;  Location: [facility];  Service: Urology;  Laterality: Right;  . EXTRACORPOREAL SHOCK WAVE LITHOTRIPSY Right 2014  . HERNIA REPAIR     left  . KNEE ARTHROSCOPY Left 2008  . ORCHIECTOMY Right 1971  . teeth pulled  2010        Home Medications    Prior to Admission medications   Medication Sig Start Date End Date Taking? Authorizing Provider  acetaminophen (TYLENOL) 500 MG tablet Take 1,000 mg by mouth 2 (two) times daily as needed for mild pain or fever.     [provider]  amoxicillin-clavulanate (AUGMENTIN) 875-125 MG tablet Take 1 tablet by mouth 2 (two) times daily. One po bid x 7 days 11/11/18   Charlesetta Shanks, MD  aspirin EC 81 MG tablet Take 81 mg by mouth daily.    [provider]  dutasteride (AVODART) 0.5 MG capsule Take 0.5 mg by mouth daily.    [provider]  EPINEPHrine (EPIPEN) 0.3 mg/0.3 mL SOAJ injection Inject 0.3 mg into the muscle once as needed (For anaphylaxis.).     [provider]  glucosamine-chondroitin 500-400 MG tablet Take  1 tablet by mouth daily.     [provider]  lisinopril (PRINIVIL,ZESTRIL) 10 MG tablet Take 10 mg by mouth daily.  08/16/17   [provider]  Multiple Vitamin (MULTIVITAMIN WITH MINERALS) TABS Take 1 tablet by mouth daily.    [provider]  omeprazole (PRILOSEC) 40 MG capsule Take 40 mg by mouth daily. 04/17/16   [provider]  saccharomyces boulardii (FLORASTOR) 250 MG capsule Take 1 capsule (250 mg total) by mouth 2 (two) times daily. 03/16/18   Florencia Reasons, MD  tamsulosin (FLOMAX) 0.4 MG CAPS capsule Take 0.4 mg by mouth daily. 01/26/18   [provider]  VIAGRA 100 MG tablet Take 100 mg by mouth daily as needed for erectile dysfunction.  04/15/16   [provider]    Family History Family History  Problem Relation Age of Onset  . Diverticulitis Mother   . Colon cancer Neg Hx     Social History Social History   Tobacco Use  . Smoking status: Former Smoker    Packs/day: 1.50    Types: Cigarettes, Pipe, Cigars    Last attempt to quit: 12/05/2009    Years since quitting: 8.9  . Smokeless tobacco: Never Used  Substance Use Topics  . Alcohol use: Yes    Alcohol/week: 2.0 standard drinks    Types: 1 Glasses of wine, 1 Shots of liquor per week  . Drug use: No     Allergies   Peanut-containing drug products and Novocain [procaine hcl]   Review of Systems Review of Systems 10 Systems reviewed and are negative for acute change except as noted in the HPI.   Physical Exam Updated Vital Signs BP (!) 143/94   Pulse 82   Temp 97.9 F (36.6 C) (Oral)   Resp 16   Ht 6' (1.829 m)   Wt 88.5 kg   SpO2 97%   BMI 26.45 kg/m   Physical Exam  Constitutional: He is oriented to person, place, and time.  Patient is alert and nontoxic.  Clinically well in appearance.  No distress.  HENT:  Head: Normocephalic and atraumatic.  Eyes: EOM are normal.  Cardiovascular: Normal rate, regular rhythm and normal heart sounds.  Pulmonary/Chest: Effort normal and breath sounds normal.  Abdominal: Soft. He exhibits no distension. There is no tenderness. There is no guarding.  No CVA tenderness.  Musculoskeletal: Normal range of motion. He exhibits no edema or tenderness.  Neurological: He is alert and oriented to person, place, and time. He exhibits normal muscle tone. Coordination normal.  Skin: Skin is warm and dry.  Psychiatric: He has a normal mood and affect.     ED Treatments / Results  Labs (all labs ordered are  listed, but only abnormal results are displayed) Labs Reviewed  URINALYSIS, ROUTINE W REFLEX MICROSCOPIC - Abnormal; Notable for the following components:      Result Value   APPearance HAZY (*)    Hgb urine dipstick TRACE (*)    Nitrite POSITIVE (*)    Leukocytes, UA SMALL (*)    All other components within normal limits  URINALYSIS, MICROSCOPIC (REFLEX) - Abnormal; Notable for the following components:   Bacteria, UA MANY (*)    All other components within normal limits  URINE CULTURE    EKG None  Radiology No results found.  Procedures Procedures (including critical care time)  Medications Ordered in ED Medications  cefTRIAXone (ROCEPHIN) injection 1 g (1 g Intramuscular Given 11/11/18 1024)  lidocaine (XYLOCAINE) 2 % (  with pres) injection 400 mg (400 mg Other Given 11/11/18 1028)     Initial Impression / Assessment and Plan / ED Course  I have reviewed the triage vital signs and the nursing notes.  Pertinent labs & imaging results that were available during my care of the patient were reviewed by me and considered in my medical decision making (see chart for details).     Urinalysis is grossly positive.  Patient has toxic UTI symptoms.  He is alert and appropriate.  Nontoxic.  No flank pain and no abdominal pain.  At this time, does not appear to need additional diagnostic testing.  Patient did have positive E. coli sepsis about 3 months ago.  He has been given an empiric IM dose of Rocephin to start antibiotics.  He will be discharged on Augmentin.  He is aware of return precautions.  He will follow-up with his PCP at the beginning of the week for recheck and follow-up on culture results.  Final Clinical Impressions(s) / ED Diagnoses   Final diagnoses:  Acute cystitis without hematuria    ED Discharge Orders         Ordered    amoxicillin-clavulanate (AUGMENTIN) 875-125 MG tablet  2 times daily     11/11/18 1116           Charlesetta Shanks, MD 11/11/18  1123

## 2018-11-13 LAB — URINE CULTURE: Culture: >=100000 — AB

## 2018-11-14 ENCOUNTER — Telehealth: Payer: Self-pay | Admitting: Emergency Medicine

## 2018-11-14 NOTE — Telephone Encounter (Signed)
Post ED Visit - Positive Culture Follow-up  Culture report reviewed by antimicrobial stewardship pharmacist:  []  Elenor Quinones, Pharm.D. []  Heide Guile, Pharm.D., BCPS AQ-ID []  Parks Neptune, Pharm.D., BCPS []  Alycia Rossetti, Pharm.D., BCPS []  [place], Pharm.D., BCPS, AAHIVP []  Legrand Como, Pharm.D., BCPS, AAHIVP [x]  Salome Arnt, PharmD, BCPS []  Johnnette Gourd, PharmD, BCPS []  Hughes Better, PharmD, BCPS []  Leeroy Cha, PharmD  Positive urine culture Treated with amoxicillin pot clavulanate, organism sensitive to the same and no further patient follow-up is required at this time.  Hazle Nordmann 11/14/2018, 11:09 AM

## 2018-11-21 DIAGNOSIS — R269 Unspecified abnormalities of gait and mobility: Secondary | ICD-10-CM | POA: Diagnosis not present

## 2018-11-21 DIAGNOSIS — M25561 Pain in right knee: Secondary | ICD-10-CM | POA: Diagnosis not present

## 2018-11-21 DIAGNOSIS — M1711 Unilateral primary osteoarthritis, right knee: Secondary | ICD-10-CM | POA: Diagnosis not present

## 2018-12-22 DIAGNOSIS — N3000 Acute cystitis without hematuria: Secondary | ICD-10-CM | POA: Diagnosis not present

## 2018-12-22 DIAGNOSIS — B9620 Unspecified Escherichia coli [E. coli] as the cause of diseases classified elsewhere: Secondary | ICD-10-CM | POA: Diagnosis not present

## 2019-01-10 DIAGNOSIS — Z8042 Family history of malignant neoplasm of prostate: Secondary | ICD-10-CM | POA: Diagnosis not present

## 2019-01-10 DIAGNOSIS — Z833 Family history of diabetes mellitus: Secondary | ICD-10-CM | POA: Diagnosis not present

## 2019-01-10 DIAGNOSIS — G8918 Other acute postprocedural pain: Secondary | ICD-10-CM | POA: Diagnosis not present

## 2019-01-10 DIAGNOSIS — Z471 Aftercare following joint replacement surgery: Secondary | ICD-10-CM | POA: Diagnosis not present

## 2019-01-10 DIAGNOSIS — R7303 Prediabetes: Secondary | ICD-10-CM | POA: Diagnosis present

## 2019-01-10 DIAGNOSIS — K219 Gastro-esophageal reflux disease without esophagitis: Secondary | ICD-10-CM | POA: Diagnosis present

## 2019-01-10 DIAGNOSIS — Z888 Allergy status to other drugs, medicaments and biological substances status: Secondary | ICD-10-CM | POA: Diagnosis not present

## 2019-01-10 DIAGNOSIS — Z96651 Presence of right artificial knee joint: Secondary | ICD-10-CM | POA: Diagnosis not present

## 2019-01-10 DIAGNOSIS — Z87891 Personal history of nicotine dependence: Secondary | ICD-10-CM | POA: Diagnosis not present

## 2019-01-10 DIAGNOSIS — M1711 Unilateral primary osteoarthritis, right knee: Secondary | ICD-10-CM | POA: Diagnosis present

## 2019-01-10 DIAGNOSIS — Z91010 Allergy to peanuts: Secondary | ICD-10-CM | POA: Diagnosis not present

## 2019-01-10 DIAGNOSIS — Z01818 Encounter for other preprocedural examination: Secondary | ICD-10-CM | POA: Diagnosis not present

## 2019-01-10 DIAGNOSIS — I10 Essential (primary) hypertension: Secondary | ICD-10-CM | POA: Diagnosis present

## 2019-01-12 DIAGNOSIS — Z96651 Presence of right artificial knee joint: Secondary | ICD-10-CM | POA: Diagnosis not present

## 2019-01-12 DIAGNOSIS — M1711 Unilateral primary osteoarthritis, right knee: Secondary | ICD-10-CM | POA: Diagnosis not present

## 2019-01-12 DIAGNOSIS — R269 Unspecified abnormalities of gait and mobility: Secondary | ICD-10-CM | POA: Diagnosis not present

## 2019-01-12 DIAGNOSIS — M25561 Pain in right knee: Secondary | ICD-10-CM | POA: Diagnosis not present

## 2019-01-16 DIAGNOSIS — M1711 Unilateral primary osteoarthritis, right knee: Secondary | ICD-10-CM | POA: Diagnosis not present

## 2019-01-16 DIAGNOSIS — M25561 Pain in right knee: Secondary | ICD-10-CM | POA: Diagnosis not present

## 2019-01-16 DIAGNOSIS — Z96651 Presence of right artificial knee joint: Secondary | ICD-10-CM | POA: Diagnosis not present

## 2019-01-16 DIAGNOSIS — R269 Unspecified abnormalities of gait and mobility: Secondary | ICD-10-CM | POA: Diagnosis not present

## 2019-01-18 DIAGNOSIS — Z96651 Presence of right artificial knee joint: Secondary | ICD-10-CM | POA: Diagnosis not present

## 2019-01-18 DIAGNOSIS — M1711 Unilateral primary osteoarthritis, right knee: Secondary | ICD-10-CM | POA: Diagnosis not present

## 2019-01-18 DIAGNOSIS — M25561 Pain in right knee: Secondary | ICD-10-CM | POA: Diagnosis not present

## 2019-01-18 DIAGNOSIS — R269 Unspecified abnormalities of gait and mobility: Secondary | ICD-10-CM | POA: Diagnosis not present

## 2019-01-24 DIAGNOSIS — M1711 Unilateral primary osteoarthritis, right knee: Secondary | ICD-10-CM | POA: Diagnosis not present

## 2019-01-24 DIAGNOSIS — R269 Unspecified abnormalities of gait and mobility: Secondary | ICD-10-CM | POA: Diagnosis not present

## 2019-01-24 DIAGNOSIS — M25561 Pain in right knee: Secondary | ICD-10-CM | POA: Diagnosis not present

## 2019-01-24 DIAGNOSIS — Z471 Aftercare following joint replacement surgery: Secondary | ICD-10-CM | POA: Diagnosis not present

## 2019-01-24 DIAGNOSIS — Z96651 Presence of right artificial knee joint: Secondary | ICD-10-CM | POA: Diagnosis not present

## 2019-01-26 DIAGNOSIS — M25561 Pain in right knee: Secondary | ICD-10-CM | POA: Diagnosis not present

## 2019-01-26 DIAGNOSIS — Z96651 Presence of right artificial knee joint: Secondary | ICD-10-CM | POA: Diagnosis not present

## 2019-01-26 DIAGNOSIS — R269 Unspecified abnormalities of gait and mobility: Secondary | ICD-10-CM | POA: Diagnosis not present

## 2019-01-26 DIAGNOSIS — M1711 Unilateral primary osteoarthritis, right knee: Secondary | ICD-10-CM | POA: Diagnosis not present

## 2019-02-01 DIAGNOSIS — M25561 Pain in right knee: Secondary | ICD-10-CM | POA: Diagnosis not present

## 2019-02-01 DIAGNOSIS — Z96651 Presence of right artificial knee joint: Secondary | ICD-10-CM | POA: Diagnosis not present

## 2019-02-01 DIAGNOSIS — R269 Unspecified abnormalities of gait and mobility: Secondary | ICD-10-CM | POA: Diagnosis not present

## 2019-02-06 DIAGNOSIS — M25561 Pain in right knee: Secondary | ICD-10-CM | POA: Diagnosis not present

## 2019-02-06 DIAGNOSIS — R269 Unspecified abnormalities of gait and mobility: Secondary | ICD-10-CM | POA: Diagnosis not present

## 2019-02-06 DIAGNOSIS — Z96651 Presence of right artificial knee joint: Secondary | ICD-10-CM | POA: Diagnosis not present

## 2019-02-08 DIAGNOSIS — Z79899 Other long term (current) drug therapy: Secondary | ICD-10-CM | POA: Diagnosis not present

## 2019-02-08 DIAGNOSIS — Z7982 Long term (current) use of aspirin: Secondary | ICD-10-CM | POA: Diagnosis not present

## 2019-02-08 DIAGNOSIS — Z87442 Personal history of urinary calculi: Secondary | ICD-10-CM | POA: Diagnosis not present

## 2019-02-08 DIAGNOSIS — N200 Calculus of kidney: Secondary | ICD-10-CM | POA: Diagnosis not present

## 2019-02-08 DIAGNOSIS — N138 Other obstructive and reflux uropathy: Secondary | ICD-10-CM | POA: Diagnosis not present

## 2019-02-08 DIAGNOSIS — Z87891 Personal history of nicotine dependence: Secondary | ICD-10-CM | POA: Diagnosis not present

## 2019-02-08 DIAGNOSIS — N5203 Combined arterial insufficiency and corporo-venous occlusive erectile dysfunction: Secondary | ICD-10-CM | POA: Diagnosis not present

## 2019-02-08 DIAGNOSIS — N401 Enlarged prostate with lower urinary tract symptoms: Secondary | ICD-10-CM | POA: Diagnosis not present

## 2019-02-12 DIAGNOSIS — M25561 Pain in right knee: Secondary | ICD-10-CM | POA: Diagnosis not present

## 2019-02-12 DIAGNOSIS — R269 Unspecified abnormalities of gait and mobility: Secondary | ICD-10-CM | POA: Diagnosis not present

## 2019-02-12 DIAGNOSIS — Z96651 Presence of right artificial knee joint: Secondary | ICD-10-CM | POA: Diagnosis not present

## 2019-02-21 DIAGNOSIS — M25461 Effusion, right knee: Secondary | ICD-10-CM | POA: Diagnosis not present

## 2019-02-21 DIAGNOSIS — Z96651 Presence of right artificial knee joint: Secondary | ICD-10-CM | POA: Diagnosis not present

## 2019-02-21 DIAGNOSIS — I7090 Unspecified atherosclerosis: Secondary | ICD-10-CM | POA: Diagnosis not present

## 2019-02-21 DIAGNOSIS — Z471 Aftercare following joint replacement surgery: Secondary | ICD-10-CM | POA: Diagnosis not present

## 2019-02-21 DIAGNOSIS — F17210 Nicotine dependence, cigarettes, uncomplicated: Secondary | ICD-10-CM | POA: Diagnosis not present

## 2019-03-07 DIAGNOSIS — I10 Essential (primary) hypertension: Secondary | ICD-10-CM | POA: Diagnosis not present

## 2019-03-07 DIAGNOSIS — R972 Elevated prostate specific antigen [PSA]: Secondary | ICD-10-CM | POA: Diagnosis not present

## 2019-03-07 DIAGNOSIS — N200 Calculus of kidney: Secondary | ICD-10-CM | POA: Diagnosis not present

## 2019-03-07 DIAGNOSIS — N390 Urinary tract infection, site not specified: Secondary | ICD-10-CM | POA: Diagnosis not present

## 2019-03-07 DIAGNOSIS — Z125 Encounter for screening for malignant neoplasm of prostate: Secondary | ICD-10-CM | POA: Diagnosis not present

## 2019-03-21 DIAGNOSIS — K769 Liver disease, unspecified: Secondary | ICD-10-CM | POA: Diagnosis not present

## 2019-03-21 DIAGNOSIS — N390 Urinary tract infection, site not specified: Secondary | ICD-10-CM | POA: Diagnosis not present

## 2019-03-21 DIAGNOSIS — K635 Polyp of colon: Secondary | ICD-10-CM | POA: Diagnosis not present

## 2019-03-21 DIAGNOSIS — N5203 Combined arterial insufficiency and corporo-venous occlusive erectile dysfunction: Secondary | ICD-10-CM | POA: Diagnosis not present

## 2019-03-21 DIAGNOSIS — I10 Essential (primary) hypertension: Secondary | ICD-10-CM | POA: Diagnosis not present

## 2019-03-21 DIAGNOSIS — R7303 Prediabetes: Secondary | ICD-10-CM | POA: Diagnosis not present

## 2019-03-21 DIAGNOSIS — Z96651 Presence of right artificial knee joint: Secondary | ICD-10-CM | POA: Diagnosis not present

## 2019-03-21 DIAGNOSIS — N200 Calculus of kidney: Secondary | ICD-10-CM | POA: Diagnosis not present

## 2019-03-29 DIAGNOSIS — K7689 Other specified diseases of liver: Secondary | ICD-10-CM | POA: Diagnosis not present

## 2019-05-09 DIAGNOSIS — M25461 Effusion, right knee: Secondary | ICD-10-CM | POA: Diagnosis not present

## 2019-05-09 DIAGNOSIS — Z471 Aftercare following joint replacement surgery: Secondary | ICD-10-CM | POA: Diagnosis not present

## 2019-05-09 DIAGNOSIS — Z96651 Presence of right artificial knee joint: Secondary | ICD-10-CM | POA: Diagnosis not present

## 2019-06-24 DIAGNOSIS — Z20828 Contact with and (suspected) exposure to other viral communicable diseases: Secondary | ICD-10-CM | POA: Diagnosis not present

## 2019-07-23 DIAGNOSIS — I10 Essential (primary) hypertension: Secondary | ICD-10-CM | POA: Diagnosis not present

## 2019-07-23 DIAGNOSIS — Z23 Encounter for immunization: Secondary | ICD-10-CM | POA: Diagnosis not present

## 2019-07-23 DIAGNOSIS — Z0000 Encounter for general adult medical examination without abnormal findings: Secondary | ICD-10-CM | POA: Diagnosis not present

## 2019-07-23 DIAGNOSIS — Z96651 Presence of right artificial knee joint: Secondary | ICD-10-CM | POA: Diagnosis not present

## 2019-07-23 DIAGNOSIS — K219 Gastro-esophageal reflux disease without esophagitis: Secondary | ICD-10-CM | POA: Diagnosis not present

## 2019-07-23 DIAGNOSIS — K635 Polyp of colon: Secondary | ICD-10-CM | POA: Diagnosis not present

## 2019-07-23 DIAGNOSIS — R7303 Prediabetes: Secondary | ICD-10-CM | POA: Diagnosis not present

## 2019-08-09 DIAGNOSIS — Z96651 Presence of right artificial knee joint: Secondary | ICD-10-CM | POA: Diagnosis not present

## 2019-08-09 DIAGNOSIS — Z471 Aftercare following joint replacement surgery: Secondary | ICD-10-CM | POA: Diagnosis not present

## 2019-08-09 DIAGNOSIS — M1712 Unilateral primary osteoarthritis, left knee: Secondary | ICD-10-CM | POA: Diagnosis not present

## 2019-08-09 DIAGNOSIS — M778 Other enthesopathies, not elsewhere classified: Secondary | ICD-10-CM | POA: Diagnosis not present

## 2019-08-09 DIAGNOSIS — I999 Unspecified disorder of circulatory system: Secondary | ICD-10-CM | POA: Diagnosis not present

## 2019-08-27 DIAGNOSIS — I10 Essential (primary) hypertension: Secondary | ICD-10-CM | POA: Diagnosis not present

## 2019-08-27 DIAGNOSIS — R7303 Prediabetes: Secondary | ICD-10-CM | POA: Diagnosis not present

## 2019-08-27 DIAGNOSIS — E785 Hyperlipidemia, unspecified: Secondary | ICD-10-CM | POA: Diagnosis not present

## 2019-08-27 DIAGNOSIS — K219 Gastro-esophageal reflux disease without esophagitis: Secondary | ICD-10-CM | POA: Diagnosis not present

## 2019-08-27 DIAGNOSIS — Z23 Encounter for immunization: Secondary | ICD-10-CM | POA: Diagnosis not present

## 2019-09-17 DIAGNOSIS — N200 Calculus of kidney: Secondary | ICD-10-CM | POA: Diagnosis not present

## 2019-09-17 DIAGNOSIS — R8290 Unspecified abnormal findings in urine: Secondary | ICD-10-CM | POA: Diagnosis not present

## 2019-09-17 DIAGNOSIS — N5203 Combined arterial insufficiency and corporo-venous occlusive erectile dysfunction: Secondary | ICD-10-CM | POA: Diagnosis not present

## 2019-09-17 DIAGNOSIS — R82998 Other abnormal findings in urine: Secondary | ICD-10-CM | POA: Diagnosis not present

## 2019-09-26 DIAGNOSIS — I10 Essential (primary) hypertension: Secondary | ICD-10-CM | POA: Diagnosis not present

## 2019-09-26 DIAGNOSIS — E785 Hyperlipidemia, unspecified: Secondary | ICD-10-CM | POA: Diagnosis not present

## 2019-09-26 DIAGNOSIS — K219 Gastro-esophageal reflux disease without esophagitis: Secondary | ICD-10-CM | POA: Diagnosis not present

## 2019-09-26 DIAGNOSIS — R7303 Prediabetes: Secondary | ICD-10-CM | POA: Diagnosis not present

## 2019-10-10 DIAGNOSIS — L03311 Cellulitis of abdominal wall: Secondary | ICD-10-CM | POA: Diagnosis not present

## 2019-10-21 DIAGNOSIS — R079 Chest pain, unspecified: Secondary | ICD-10-CM | POA: Diagnosis not present

## 2019-10-21 DIAGNOSIS — I493 Ventricular premature depolarization: Secondary | ICD-10-CM | POA: Diagnosis not present

## 2019-10-21 DIAGNOSIS — F410 Panic disorder [episodic paroxysmal anxiety] without agoraphobia: Secondary | ICD-10-CM | POA: Diagnosis not present

## 2019-10-21 DIAGNOSIS — Z8679 Personal history of other diseases of the circulatory system: Secondary | ICD-10-CM | POA: Diagnosis not present

## 2019-10-22 DIAGNOSIS — I493 Ventricular premature depolarization: Secondary | ICD-10-CM | POA: Diagnosis not present

## 2020-01-30 DIAGNOSIS — Z23 Encounter for immunization: Secondary | ICD-10-CM | POA: Diagnosis not present

## 2020-02-08 DIAGNOSIS — R202 Paresthesia of skin: Secondary | ICD-10-CM | POA: Diagnosis not present

## 2020-02-12 DIAGNOSIS — E785 Hyperlipidemia, unspecified: Secondary | ICD-10-CM | POA: Diagnosis not present

## 2020-02-12 DIAGNOSIS — R202 Paresthesia of skin: Secondary | ICD-10-CM | POA: Diagnosis not present

## 2020-02-12 DIAGNOSIS — I10 Essential (primary) hypertension: Secondary | ICD-10-CM | POA: Diagnosis not present

## 2020-02-12 DIAGNOSIS — K219 Gastro-esophageal reflux disease without esophagitis: Secondary | ICD-10-CM | POA: Diagnosis not present

## 2020-02-12 DIAGNOSIS — R7303 Prediabetes: Secondary | ICD-10-CM | POA: Diagnosis not present

## 2020-02-25 DIAGNOSIS — S99921A Unspecified injury of right foot, initial encounter: Secondary | ICD-10-CM | POA: Diagnosis not present

## 2020-02-25 DIAGNOSIS — S92514A Nondisplaced fracture of proximal phalanx of right lesser toe(s), initial encounter for closed fracture: Secondary | ICD-10-CM | POA: Diagnosis not present

## 2020-02-27 DIAGNOSIS — Z23 Encounter for immunization: Secondary | ICD-10-CM | POA: Diagnosis not present

## 2020-03-04 DIAGNOSIS — N5203 Combined arterial insufficiency and corporo-venous occlusive erectile dysfunction: Secondary | ICD-10-CM | POA: Diagnosis not present

## 2020-03-04 DIAGNOSIS — R8290 Unspecified abnormal findings in urine: Secondary | ICD-10-CM | POA: Diagnosis not present

## 2020-03-04 DIAGNOSIS — N200 Calculus of kidney: Secondary | ICD-10-CM | POA: Diagnosis not present

## 2020-03-07 DIAGNOSIS — R7303 Prediabetes: Secondary | ICD-10-CM | POA: Diagnosis not present

## 2020-03-07 DIAGNOSIS — E785 Hyperlipidemia, unspecified: Secondary | ICD-10-CM | POA: Diagnosis not present

## 2020-03-07 DIAGNOSIS — I10 Essential (primary) hypertension: Secondary | ICD-10-CM | POA: Diagnosis not present

## 2020-03-11 DIAGNOSIS — K219 Gastro-esophageal reflux disease without esophagitis: Secondary | ICD-10-CM | POA: Diagnosis not present

## 2020-03-11 DIAGNOSIS — E1165 Type 2 diabetes mellitus with hyperglycemia: Secondary | ICD-10-CM | POA: Diagnosis not present

## 2020-03-11 DIAGNOSIS — E785 Hyperlipidemia, unspecified: Secondary | ICD-10-CM | POA: Diagnosis not present

## 2020-03-11 DIAGNOSIS — I10 Essential (primary) hypertension: Secondary | ICD-10-CM | POA: Diagnosis not present

## 2020-03-23 DIAGNOSIS — N132 Hydronephrosis with renal and ureteral calculous obstruction: Secondary | ICD-10-CM | POA: Diagnosis not present

## 2020-03-23 DIAGNOSIS — M545 Low back pain: Secondary | ICD-10-CM | POA: Diagnosis not present

## 2020-03-23 DIAGNOSIS — R109 Unspecified abdominal pain: Secondary | ICD-10-CM | POA: Diagnosis not present

## 2020-03-23 DIAGNOSIS — N201 Calculus of ureter: Secondary | ICD-10-CM | POA: Diagnosis not present

## 2020-04-04 DIAGNOSIS — I10 Essential (primary) hypertension: Secondary | ICD-10-CM | POA: Diagnosis not present

## 2020-04-04 DIAGNOSIS — E785 Hyperlipidemia, unspecified: Secondary | ICD-10-CM | POA: Diagnosis not present

## 2020-04-04 DIAGNOSIS — E1165 Type 2 diabetes mellitus with hyperglycemia: Secondary | ICD-10-CM | POA: Diagnosis not present

## 2020-04-07 DIAGNOSIS — E785 Hyperlipidemia, unspecified: Secondary | ICD-10-CM | POA: Diagnosis not present

## 2020-04-07 DIAGNOSIS — I10 Essential (primary) hypertension: Secondary | ICD-10-CM | POA: Diagnosis not present

## 2020-04-07 DIAGNOSIS — N200 Calculus of kidney: Secondary | ICD-10-CM | POA: Diagnosis not present

## 2020-04-07 DIAGNOSIS — E1165 Type 2 diabetes mellitus with hyperglycemia: Secondary | ICD-10-CM | POA: Diagnosis not present

## 2020-04-10 DIAGNOSIS — E1165 Type 2 diabetes mellitus with hyperglycemia: Secondary | ICD-10-CM | POA: Diagnosis not present

## 2020-05-08 DIAGNOSIS — Z471 Aftercare following joint replacement surgery: Secondary | ICD-10-CM | POA: Diagnosis not present

## 2020-05-08 DIAGNOSIS — Z96651 Presence of right artificial knee joint: Secondary | ICD-10-CM | POA: Diagnosis not present

## 2020-05-08 DIAGNOSIS — M1712 Unilateral primary osteoarthritis, left knee: Secondary | ICD-10-CM | POA: Diagnosis not present

## 2020-05-28 DIAGNOSIS — N210 Calculus in bladder: Secondary | ICD-10-CM | POA: Diagnosis not present

## 2020-05-28 DIAGNOSIS — N200 Calculus of kidney: Secondary | ICD-10-CM | POA: Diagnosis not present

## 2020-05-28 DIAGNOSIS — R8290 Unspecified abnormal findings in urine: Secondary | ICD-10-CM | POA: Diagnosis not present

## 2020-05-28 DIAGNOSIS — R3912 Poor urinary stream: Secondary | ICD-10-CM | POA: Diagnosis not present

## 2020-06-08 DIAGNOSIS — M1712 Unilateral primary osteoarthritis, left knee: Secondary | ICD-10-CM | POA: Diagnosis not present

## 2020-06-08 DIAGNOSIS — S8992XA Unspecified injury of left lower leg, initial encounter: Secondary | ICD-10-CM | POA: Diagnosis not present

## 2020-06-08 DIAGNOSIS — W19XXXA Unspecified fall, initial encounter: Secondary | ICD-10-CM | POA: Diagnosis not present

## 2020-06-08 DIAGNOSIS — Y998 Other external cause status: Secondary | ICD-10-CM | POA: Diagnosis not present

## 2020-06-08 DIAGNOSIS — M25562 Pain in left knee: Secondary | ICD-10-CM | POA: Diagnosis not present

## 2020-07-21 DIAGNOSIS — H53032 Strabismic amblyopia, left eye: Secondary | ICD-10-CM | POA: Diagnosis not present

## 2020-07-21 DIAGNOSIS — H5203 Hypermetropia, bilateral: Secondary | ICD-10-CM | POA: Diagnosis not present

## 2020-07-21 DIAGNOSIS — H52223 Regular astigmatism, bilateral: Secondary | ICD-10-CM | POA: Diagnosis not present

## 2020-07-21 DIAGNOSIS — H524 Presbyopia: Secondary | ICD-10-CM | POA: Diagnosis not present

## 2020-07-21 DIAGNOSIS — H2513 Age-related nuclear cataract, bilateral: Secondary | ICD-10-CM | POA: Diagnosis not present

## 2020-07-21 DIAGNOSIS — H5000 Unspecified esotropia: Secondary | ICD-10-CM | POA: Diagnosis not present

## 2020-07-21 DIAGNOSIS — E119 Type 2 diabetes mellitus without complications: Secondary | ICD-10-CM | POA: Diagnosis not present

## 2020-08-08 DIAGNOSIS — Z96651 Presence of right artificial knee joint: Secondary | ICD-10-CM | POA: Diagnosis not present

## 2020-08-08 DIAGNOSIS — I10 Essential (primary) hypertension: Secondary | ICD-10-CM | POA: Diagnosis not present

## 2020-08-08 DIAGNOSIS — N200 Calculus of kidney: Secondary | ICD-10-CM | POA: Diagnosis not present

## 2020-08-08 DIAGNOSIS — E785 Hyperlipidemia, unspecified: Secondary | ICD-10-CM | POA: Diagnosis not present

## 2020-08-08 DIAGNOSIS — Z0000 Encounter for general adult medical examination without abnormal findings: Secondary | ICD-10-CM | POA: Diagnosis not present

## 2020-08-08 DIAGNOSIS — E1165 Type 2 diabetes mellitus with hyperglycemia: Secondary | ICD-10-CM | POA: Diagnosis not present

## 2020-08-08 DIAGNOSIS — K635 Polyp of colon: Secondary | ICD-10-CM | POA: Diagnosis not present

## 2020-08-08 DIAGNOSIS — K219 Gastro-esophageal reflux disease without esophagitis: Secondary | ICD-10-CM | POA: Diagnosis not present

## 2020-08-11 DIAGNOSIS — E785 Hyperlipidemia, unspecified: Secondary | ICD-10-CM | POA: Diagnosis not present

## 2020-08-11 DIAGNOSIS — I10 Essential (primary) hypertension: Secondary | ICD-10-CM | POA: Diagnosis not present

## 2020-08-11 DIAGNOSIS — E1165 Type 2 diabetes mellitus with hyperglycemia: Secondary | ICD-10-CM | POA: Diagnosis not present

## 2020-08-20 DIAGNOSIS — L03317 Cellulitis of buttock: Secondary | ICD-10-CM | POA: Diagnosis not present

## 2020-08-20 DIAGNOSIS — L0231 Cutaneous abscess of buttock: Secondary | ICD-10-CM | POA: Diagnosis not present

## 2020-09-02 DIAGNOSIS — R933 Abnormal findings on diagnostic imaging of other parts of digestive tract: Secondary | ICD-10-CM | POA: Diagnosis not present

## 2020-09-02 DIAGNOSIS — N210 Calculus in bladder: Secondary | ICD-10-CM | POA: Diagnosis not present

## 2020-09-02 DIAGNOSIS — N200 Calculus of kidney: Secondary | ICD-10-CM | POA: Diagnosis not present

## 2020-09-02 DIAGNOSIS — B957 Other staphylococcus as the cause of diseases classified elsewhere: Secondary | ICD-10-CM | POA: Diagnosis not present

## 2020-09-02 DIAGNOSIS — N5203 Combined arterial insufficiency and corporo-venous occlusive erectile dysfunction: Secondary | ICD-10-CM | POA: Diagnosis not present

## 2020-09-02 DIAGNOSIS — N390 Urinary tract infection, site not specified: Secondary | ICD-10-CM | POA: Diagnosis not present

## 2020-09-02 DIAGNOSIS — R8290 Unspecified abnormal findings in urine: Secondary | ICD-10-CM | POA: Diagnosis not present

## 2020-09-03 DIAGNOSIS — M11262 Other chondrocalcinosis, left knee: Secondary | ICD-10-CM | POA: Diagnosis not present

## 2020-09-03 DIAGNOSIS — Z01818 Encounter for other preprocedural examination: Secondary | ICD-10-CM | POA: Diagnosis not present

## 2020-09-03 DIAGNOSIS — E119 Type 2 diabetes mellitus without complications: Secondary | ICD-10-CM | POA: Diagnosis not present

## 2020-09-03 DIAGNOSIS — M1712 Unilateral primary osteoarthritis, left knee: Secondary | ICD-10-CM | POA: Diagnosis not present

## 2020-09-03 DIAGNOSIS — M1612 Unilateral primary osteoarthritis, left hip: Secondary | ICD-10-CM | POA: Diagnosis not present

## 2020-09-03 DIAGNOSIS — Z96651 Presence of right artificial knee joint: Secondary | ICD-10-CM | POA: Diagnosis not present

## 2020-09-03 DIAGNOSIS — I10 Essential (primary) hypertension: Secondary | ICD-10-CM | POA: Diagnosis not present

## 2020-09-03 DIAGNOSIS — E785 Hyperlipidemia, unspecified: Secondary | ICD-10-CM | POA: Diagnosis not present

## 2020-09-03 DIAGNOSIS — F17290 Nicotine dependence, other tobacco product, uncomplicated: Secondary | ICD-10-CM | POA: Diagnosis not present

## 2020-09-03 DIAGNOSIS — K219 Gastro-esophageal reflux disease without esophagitis: Secondary | ICD-10-CM | POA: Diagnosis not present

## 2020-09-15 DIAGNOSIS — M1712 Unilateral primary osteoarthritis, left knee: Secondary | ICD-10-CM | POA: Diagnosis not present

## 2020-09-15 DIAGNOSIS — R799 Abnormal finding of blood chemistry, unspecified: Secondary | ICD-10-CM | POA: Diagnosis not present

## 2020-09-24 DIAGNOSIS — M25562 Pain in left knee: Secondary | ICD-10-CM | POA: Diagnosis not present

## 2020-09-24 DIAGNOSIS — G8929 Other chronic pain: Secondary | ICD-10-CM | POA: Diagnosis not present

## 2020-09-24 DIAGNOSIS — M25662 Stiffness of left knee, not elsewhere classified: Secondary | ICD-10-CM | POA: Diagnosis not present

## 2020-09-24 DIAGNOSIS — M6281 Muscle weakness (generalized): Secondary | ICD-10-CM | POA: Diagnosis not present

## 2020-09-24 DIAGNOSIS — M1712 Unilateral primary osteoarthritis, left knee: Secondary | ICD-10-CM | POA: Diagnosis not present

## 2020-09-25 DIAGNOSIS — Z5189 Encounter for other specified aftercare: Secondary | ICD-10-CM | POA: Diagnosis not present

## 2020-09-25 DIAGNOSIS — Z4800 Encounter for change or removal of nonsurgical wound dressing: Secondary | ICD-10-CM | POA: Diagnosis not present

## 2020-09-30 DIAGNOSIS — N400 Enlarged prostate without lower urinary tract symptoms: Secondary | ICD-10-CM | POA: Diagnosis not present

## 2020-09-30 DIAGNOSIS — G8918 Other acute postprocedural pain: Secondary | ICD-10-CM | POA: Diagnosis not present

## 2020-09-30 DIAGNOSIS — Z96651 Presence of right artificial knee joint: Secondary | ICD-10-CM | POA: Diagnosis not present

## 2020-09-30 DIAGNOSIS — K219 Gastro-esophageal reflux disease without esophagitis: Secondary | ICD-10-CM | POA: Diagnosis not present

## 2020-09-30 DIAGNOSIS — E785 Hyperlipidemia, unspecified: Secondary | ICD-10-CM | POA: Diagnosis not present

## 2020-09-30 DIAGNOSIS — E119 Type 2 diabetes mellitus without complications: Secondary | ICD-10-CM | POA: Diagnosis not present

## 2020-09-30 DIAGNOSIS — Z96652 Presence of left artificial knee joint: Secondary | ICD-10-CM | POA: Diagnosis not present

## 2020-09-30 DIAGNOSIS — I10 Essential (primary) hypertension: Secondary | ICD-10-CM | POA: Diagnosis not present

## 2020-09-30 DIAGNOSIS — Z471 Aftercare following joint replacement surgery: Secondary | ICD-10-CM | POA: Diagnosis not present

## 2020-09-30 DIAGNOSIS — M1712 Unilateral primary osteoarthritis, left knee: Secondary | ICD-10-CM | POA: Diagnosis not present

## 2020-10-01 DIAGNOSIS — E119 Type 2 diabetes mellitus without complications: Secondary | ICD-10-CM | POA: Diagnosis not present

## 2020-10-01 DIAGNOSIS — N400 Enlarged prostate without lower urinary tract symptoms: Secondary | ICD-10-CM | POA: Diagnosis not present

## 2020-10-01 DIAGNOSIS — M1712 Unilateral primary osteoarthritis, left knee: Secondary | ICD-10-CM | POA: Diagnosis not present

## 2020-10-01 DIAGNOSIS — E785 Hyperlipidemia, unspecified: Secondary | ICD-10-CM | POA: Diagnosis not present

## 2020-10-01 DIAGNOSIS — K219 Gastro-esophageal reflux disease without esophagitis: Secondary | ICD-10-CM | POA: Diagnosis not present

## 2020-10-01 DIAGNOSIS — I10 Essential (primary) hypertension: Secondary | ICD-10-CM | POA: Diagnosis not present

## 2020-10-03 DIAGNOSIS — M1712 Unilateral primary osteoarthritis, left knee: Secondary | ICD-10-CM | POA: Diagnosis not present

## 2020-10-03 DIAGNOSIS — M25562 Pain in left knee: Secondary | ICD-10-CM | POA: Diagnosis not present

## 2020-10-03 DIAGNOSIS — M25662 Stiffness of left knee, not elsewhere classified: Secondary | ICD-10-CM | POA: Diagnosis not present

## 2020-10-03 DIAGNOSIS — M6281 Muscle weakness (generalized): Secondary | ICD-10-CM | POA: Diagnosis not present

## 2020-10-03 DIAGNOSIS — G8929 Other chronic pain: Secondary | ICD-10-CM | POA: Diagnosis not present

## 2020-10-06 DIAGNOSIS — G8929 Other chronic pain: Secondary | ICD-10-CM | POA: Diagnosis not present

## 2020-10-06 DIAGNOSIS — M25662 Stiffness of left knee, not elsewhere classified: Secondary | ICD-10-CM | POA: Diagnosis not present

## 2020-10-06 DIAGNOSIS — M6281 Muscle weakness (generalized): Secondary | ICD-10-CM | POA: Diagnosis not present

## 2020-10-06 DIAGNOSIS — M1712 Unilateral primary osteoarthritis, left knee: Secondary | ICD-10-CM | POA: Diagnosis not present

## 2020-10-06 DIAGNOSIS — M25562 Pain in left knee: Secondary | ICD-10-CM | POA: Diagnosis not present

## 2020-10-10 DIAGNOSIS — M25662 Stiffness of left knee, not elsewhere classified: Secondary | ICD-10-CM | POA: Diagnosis not present

## 2020-10-10 DIAGNOSIS — M25562 Pain in left knee: Secondary | ICD-10-CM | POA: Diagnosis not present

## 2020-10-10 DIAGNOSIS — M6281 Muscle weakness (generalized): Secondary | ICD-10-CM | POA: Diagnosis not present

## 2020-10-10 DIAGNOSIS — M1712 Unilateral primary osteoarthritis, left knee: Secondary | ICD-10-CM | POA: Diagnosis not present

## 2020-10-10 DIAGNOSIS — G8929 Other chronic pain: Secondary | ICD-10-CM | POA: Diagnosis not present

## 2020-10-13 DIAGNOSIS — M25562 Pain in left knee: Secondary | ICD-10-CM | POA: Diagnosis not present

## 2020-10-13 DIAGNOSIS — M25662 Stiffness of left knee, not elsewhere classified: Secondary | ICD-10-CM | POA: Diagnosis not present

## 2020-10-13 DIAGNOSIS — M6281 Muscle weakness (generalized): Secondary | ICD-10-CM | POA: Diagnosis not present

## 2020-10-13 DIAGNOSIS — M1712 Unilateral primary osteoarthritis, left knee: Secondary | ICD-10-CM | POA: Diagnosis not present

## 2020-10-13 DIAGNOSIS — G8929 Other chronic pain: Secondary | ICD-10-CM | POA: Diagnosis not present

## 2020-10-16 DIAGNOSIS — R269 Unspecified abnormalities of gait and mobility: Secondary | ICD-10-CM | POA: Diagnosis not present

## 2020-10-16 DIAGNOSIS — M25662 Stiffness of left knee, not elsewhere classified: Secondary | ICD-10-CM | POA: Diagnosis not present

## 2020-10-16 DIAGNOSIS — M6281 Muscle weakness (generalized): Secondary | ICD-10-CM | POA: Diagnosis not present

## 2020-10-16 DIAGNOSIS — Z96652 Presence of left artificial knee joint: Secondary | ICD-10-CM | POA: Diagnosis not present

## 2020-10-16 DIAGNOSIS — Z471 Aftercare following joint replacement surgery: Secondary | ICD-10-CM | POA: Diagnosis not present

## 2020-11-13 DIAGNOSIS — Z96653 Presence of artificial knee joint, bilateral: Secondary | ICD-10-CM | POA: Diagnosis not present

## 2020-11-13 DIAGNOSIS — Z96652 Presence of left artificial knee joint: Secondary | ICD-10-CM | POA: Diagnosis not present

## 2020-11-13 DIAGNOSIS — Z471 Aftercare following joint replacement surgery: Secondary | ICD-10-CM | POA: Diagnosis not present

## 2024-09-20 ENCOUNTER — Encounter (INDEPENDENT_AMBULATORY_CARE_PROVIDER_SITE_OTHER): Payer: Self-pay
